# Patient Record
Sex: Female | Born: 1935 | Race: White | Hispanic: No | Smoking: Never smoker
Health system: Southern US, Community
[De-identification: ages and names within clinical notes are randomized; demographics above are authoritative.]

## PROBLEM LIST (undated history)

## (undated) DIAGNOSIS — I4891 Unspecified atrial fibrillation: Secondary | ICD-10-CM

## (undated) DIAGNOSIS — Z950 Presence of cardiac pacemaker: Secondary | ICD-10-CM

## (undated) DIAGNOSIS — M1990 Unspecified osteoarthritis, unspecified site: Secondary | ICD-10-CM

## (undated) DIAGNOSIS — M11279 Other chondrocalcinosis, unspecified ankle and foot: Secondary | ICD-10-CM

## (undated) DIAGNOSIS — G459 Transient cerebral ischemic attack, unspecified: Secondary | ICD-10-CM

## (undated) DIAGNOSIS — K219 Gastro-esophageal reflux disease without esophagitis: Secondary | ICD-10-CM

## (undated) DIAGNOSIS — J45909 Unspecified asthma, uncomplicated: Secondary | ICD-10-CM

## (undated) DIAGNOSIS — R011 Cardiac murmur, unspecified: Secondary | ICD-10-CM

## (undated) DIAGNOSIS — H269 Unspecified cataract: Secondary | ICD-10-CM

## (undated) DIAGNOSIS — L309 Dermatitis, unspecified: Secondary | ICD-10-CM

## (undated) DIAGNOSIS — M109 Gout, unspecified: Secondary | ICD-10-CM

## (undated) DIAGNOSIS — E079 Disorder of thyroid, unspecified: Secondary | ICD-10-CM

## (undated) HISTORY — PX: PACEMAKER INSERTION: SHX728

## (undated) HISTORY — DX: Gout, unspecified: M10.9

## (undated) HISTORY — PX: EYE SURGERY: SHX253

## (undated) HISTORY — DX: Dermatitis, unspecified: L30.9

## (undated) HISTORY — PX: OTHER SURGICAL HISTORY: SHX169

## (undated) HISTORY — PX: HIP ARTHROPLASTY: SHX981

## (undated) HISTORY — DX: Disorder of thyroid, unspecified: E07.9

## (undated) HISTORY — DX: Unspecified osteoarthritis, unspecified site: M19.90

## (undated) HISTORY — DX: Cardiac murmur, unspecified: R01.1

## (undated) HISTORY — DX: Unspecified atrial fibrillation: I48.91

## (undated) HISTORY — PX: ABLATION: SHX5711

## (undated) HISTORY — DX: Unspecified cataract: H26.9

## (undated) HISTORY — DX: Gastro-esophageal reflux disease without esophagitis: K21.9

## (undated) HISTORY — DX: Other chondrocalcinosis, unspecified ankle and foot: M11.279

---

## 2009-10-20 DIAGNOSIS — Z9889 Other specified postprocedural states: Secondary | ICD-10-CM | POA: Insufficient documentation

## 2011-04-15 ENCOUNTER — Ambulatory Visit: Payer: Self-pay

## 2011-12-23 ENCOUNTER — Ambulatory Visit: Payer: Self-pay | Admitting: Internal Medicine

## 2011-12-28 ENCOUNTER — Ambulatory Visit: Payer: Self-pay | Admitting: Family Medicine

## 2011-12-29 ENCOUNTER — Emergency Department: Payer: Self-pay | Admitting: *Deleted

## 2011-12-29 LAB — CBC
HCT: 42.5 % (ref 35.0–47.0)
HGB: 14 g/dL (ref 12.0–16.0)
MCH: 29.6 pg (ref 26.0–34.0)
MCHC: 32.9 g/dL (ref 32.0–36.0)
MCV: 90 fL (ref 80–100)
Platelet: 284 10*3/uL (ref 150–440)
RBC: 4.72 10*6/uL (ref 3.80–5.20)
RDW: 13.4 % (ref 11.5–14.5)
WBC: 9.2 10*3/uL (ref 3.6–11.0)

## 2011-12-30 LAB — COMPREHENSIVE METABOLIC PANEL
Albumin: 4 g/dL (ref 3.4–5.0)
Alkaline Phosphatase: 143 U/L — ABNORMAL HIGH (ref 50–136)
Anion Gap: 6 — ABNORMAL LOW (ref 7–16)
BUN: 10 mg/dL (ref 7–18)
Bilirubin,Total: 0.4 mg/dL (ref 0.2–1.0)
Calcium, Total: 8.7 mg/dL (ref 8.5–10.1)
Chloride: 100 mmol/L (ref 98–107)
Co2: 30 mmol/L (ref 21–32)
Creatinine: 0.69 mg/dL (ref 0.60–1.30)
EGFR (African American): >60
EGFR (Non-African Amer.): >60
Glucose: 111 mg/dL — ABNORMAL HIGH (ref 65–99)
Osmolality: 272 (ref 275–301)
Potassium: 3.8 mmol/L (ref 3.5–5.1)
SGOT(AST): 24 U/L (ref 15–37)
SGPT (ALT): 21 U/L (ref 12–78)
Sodium: 136 mmol/L (ref 136–145)
Total Protein: 7.5 g/dL (ref 6.4–8.2)

## 2011-12-30 LAB — CK TOTAL AND CKMB (NOT AT ARMC)
CK, Total: 78 U/L (ref 21–215)
CK-MB: 1.1 ng/mL (ref 0.5–3.6)

## 2011-12-30 LAB — MAGNESIUM: Magnesium: 1.9 mg/dL

## 2011-12-30 LAB — TROPONIN I: Troponin-I: <0.02 ng/mL

## 2012-02-23 ENCOUNTER — Ambulatory Visit: Payer: Self-pay

## 2012-02-29 ENCOUNTER — Emergency Department: Payer: Self-pay | Admitting: Emergency Medicine

## 2012-02-29 ENCOUNTER — Ambulatory Visit: Payer: Self-pay | Admitting: Family Medicine

## 2012-02-29 LAB — CBC
HCT: 40.5 % (ref 35.0–47.0)
HGB: 13.7 g/dL (ref 12.0–16.0)
MCH: 30.2 pg (ref 26.0–34.0)
MCHC: 33.8 g/dL (ref 32.0–36.0)
MCV: 90 fL (ref 80–100)
Platelet: 306 10*3/uL (ref 150–440)
RBC: 4.53 10*6/uL (ref 3.80–5.20)
RDW: 14.1 % (ref 11.5–14.5)
WBC: 7.3 10*3/uL (ref 3.6–11.0)

## 2012-02-29 LAB — COMPREHENSIVE METABOLIC PANEL
Albumin: 3.6 g/dL (ref 3.4–5.0)
Alkaline Phosphatase: 155 U/L — ABNORMAL HIGH (ref 50–136)
Anion Gap: 9 (ref 7–16)
BUN: 6 mg/dL — ABNORMAL LOW (ref 7–18)
Bilirubin,Total: 0.6 mg/dL (ref 0.2–1.0)
Calcium, Total: 8.9 mg/dL (ref 8.5–10.1)
Chloride: 94 mmol/L — ABNORMAL LOW (ref 98–107)
Co2: 28 mmol/L (ref 21–32)
Creatinine: 0.65 mg/dL (ref 0.60–1.30)
EGFR (African American): >60
EGFR (Non-African Amer.): >60
Glucose: 96 mg/dL (ref 65–99)
Osmolality: 260 (ref 275–301)
Potassium: 3.6 mmol/L (ref 3.5–5.1)
SGOT(AST): 17 U/L (ref 15–37)
SGPT (ALT): 20 U/L (ref 12–78)
Sodium: 131 mmol/L — ABNORMAL LOW (ref 136–145)
Total Protein: 7.1 g/dL (ref 6.4–8.2)

## 2012-03-06 ENCOUNTER — Ambulatory Visit: Payer: Self-pay | Admitting: Vascular Surgery

## 2012-04-21 ENCOUNTER — Ambulatory Visit: Payer: Self-pay | Admitting: Family Medicine

## 2012-04-21 LAB — URINALYSIS, COMPLETE
Glucose,UR: NEGATIVE mg/dL (ref 0–75)
Ketone: NEGATIVE
Leukocyte Esterase: NEGATIVE
Nitrite: NEGATIVE
Ph: 7 (ref 4.5–8.0)
Specific Gravity: 1.01 (ref 1.003–1.030)

## 2012-04-22 LAB — URINE CULTURE

## 2012-05-20 ENCOUNTER — Emergency Department: Payer: Self-pay | Admitting: Emergency Medicine

## 2012-05-20 LAB — URINALYSIS, COMPLETE
Bilirubin,UR: NEGATIVE
Blood: NEGATIVE
Glucose,UR: NEGATIVE mg/dL (ref 0–75)
Ketone: NEGATIVE
Leukocyte Esterase: NEGATIVE
Nitrite: NEGATIVE
Ph: 6 (ref 4.5–8.0)
Protein: NEGATIVE
RBC,UR: 2 /HPF (ref 0–5)
Specific Gravity: >1.06 (ref 1.003–1.030)
Squamous Epithelial: <1
WBC UR: <1 /HPF (ref 0–5)

## 2012-05-20 LAB — COMPREHENSIVE METABOLIC PANEL
Albumin: 3.7 g/dL (ref 3.4–5.0)
Alkaline Phosphatase: 154 U/L — ABNORMAL HIGH (ref 50–136)
Anion Gap: 10 (ref 7–16)
BUN: 13 mg/dL (ref 7–18)
Bilirubin,Total: 0.6 mg/dL (ref 0.2–1.0)
Calcium, Total: 8.5 mg/dL (ref 8.5–10.1)
Chloride: 103 mmol/L (ref 98–107)
Co2: 24 mmol/L (ref 21–32)
Creatinine: 0.73 mg/dL (ref 0.60–1.30)
EGFR (African American): >60
EGFR (Non-African Amer.): >60
Glucose: 106 mg/dL — ABNORMAL HIGH (ref 65–99)
Osmolality: 274 (ref 275–301)
Potassium: 3.6 mmol/L (ref 3.5–5.1)
SGOT(AST): 24 U/L (ref 15–37)
SGPT (ALT): 26 U/L (ref 12–78)
Sodium: 137 mmol/L (ref 136–145)
Total Protein: 7.3 g/dL (ref 6.4–8.2)

## 2012-05-20 LAB — CBC
HCT: 42.1 % (ref 35.0–47.0)
HGB: 14 g/dL (ref 12.0–16.0)
MCH: 30.2 pg (ref 26.0–34.0)
MCHC: 33.2 g/dL (ref 32.0–36.0)
MCV: 91 fL (ref 80–100)
Platelet: 334 10*3/uL (ref 150–440)
RBC: 4.63 10*6/uL (ref 3.80–5.20)
RDW: 13.8 % (ref 11.5–14.5)
WBC: 14.3 10*3/uL — ABNORMAL HIGH (ref 3.6–11.0)

## 2012-05-20 LAB — CK TOTAL AND CKMB (NOT AT ARMC)
CK, Total: 86 U/L (ref 21–215)
CK-MB: 1.9 ng/mL (ref 0.5–3.6)

## 2012-05-20 LAB — TROPONIN I: Troponin-I: <0.02 ng/mL

## 2012-05-20 LAB — LIPASE, BLOOD: Lipase: 95 U/L (ref 73–393)

## 2012-05-20 LAB — TSH: Thyroid Stimulating Horm: 1.41 u[IU]/mL

## 2012-06-01 DIAGNOSIS — M87059 Idiopathic aseptic necrosis of unspecified femur: Secondary | ICD-10-CM | POA: Insufficient documentation

## 2012-06-01 DIAGNOSIS — M5136 Other intervertebral disc degeneration, lumbar region: Secondary | ICD-10-CM | POA: Insufficient documentation

## 2012-06-01 DIAGNOSIS — M5416 Radiculopathy, lumbar region: Secondary | ICD-10-CM | POA: Insufficient documentation

## 2012-06-03 ENCOUNTER — Inpatient Hospital Stay: Payer: Self-pay | Admitting: Internal Medicine

## 2012-06-03 LAB — COMPREHENSIVE METABOLIC PANEL
Albumin: 3.7 g/dL (ref 3.4–5.0)
Alkaline Phosphatase: 100 U/L (ref 50–136)
Anion Gap: 8 (ref 7–16)
BUN: 16 mg/dL (ref 7–18)
Bilirubin,Total: 0.3 mg/dL (ref 0.2–1.0)
Calcium, Total: 8.6 mg/dL (ref 8.5–10.1)
Chloride: 103 mmol/L (ref 98–107)
Co2: 27 mmol/L (ref 21–32)
Creatinine: 0.93 mg/dL (ref 0.60–1.30)
EGFR (African American): >60
EGFR (Non-African Amer.): 56 — ABNORMAL LOW
Glucose: 93 mg/dL (ref 65–99)
Osmolality: 277 (ref 275–301)
Potassium: 3.9 mmol/L (ref 3.5–5.1)
SGOT(AST): 27 U/L (ref 15–37)
SGPT (ALT): 23 U/L (ref 12–78)
Sodium: 138 mmol/L (ref 136–145)
Total Protein: 6.9 g/dL (ref 6.4–8.2)

## 2012-06-03 LAB — CBC
HCT: 40.8 % (ref 35.0–47.0)
HGB: 13.7 g/dL (ref 12.0–16.0)
MCH: 30.3 pg (ref 26.0–34.0)
MCHC: 33.6 g/dL (ref 32.0–36.0)
MCV: 90 fL (ref 80–100)
Platelet: 396 10*3/uL (ref 150–440)
RBC: 4.53 10*6/uL (ref 3.80–5.20)
RDW: 13.9 % (ref 11.5–14.5)
WBC: 12.9 10*3/uL — ABNORMAL HIGH (ref 3.6–11.0)

## 2012-06-03 LAB — URINALYSIS, COMPLETE
Bacteria: NONE SEEN
Bilirubin,UR: NEGATIVE
Blood: NEGATIVE
Glucose,UR: NEGATIVE mg/dL (ref 0–75)
Ketone: NEGATIVE
Leukocyte Esterase: NEGATIVE
Nitrite: NEGATIVE
Ph: 7 (ref 4.5–8.0)
Protein: NEGATIVE
RBC,UR: <1 /HPF (ref 0–5)
Specific Gravity: 1.01 (ref 1.003–1.030)
Squamous Epithelial: <1
WBC UR: <1 /HPF (ref 0–5)

## 2012-06-03 LAB — TROPONIN I
Troponin-I: <0.02 ng/mL
Troponin-I: <0.02 ng/mL

## 2012-06-03 LAB — CK TOTAL AND CKMB (NOT AT ARMC)
CK, Total: 67 U/L (ref 21–215)
CK, Total: 75 U/L (ref 21–215)
CK-MB: 0.6 ng/mL (ref 0.5–3.6)
CK-MB: 0.7 ng/mL (ref 0.5–3.6)

## 2012-06-03 LAB — PRO B NATRIURETIC PEPTIDE: B-Type Natriuretic Peptide: 1106 pg/mL — ABNORMAL HIGH (ref 0–450)

## 2012-06-03 LAB — CK-MB: CK-MB: 0.7 ng/mL (ref 0.5–3.6)

## 2012-06-04 LAB — CBC WITH DIFFERENTIAL/PLATELET
Basophil #: 0.1 10*3/uL (ref 0.0–0.1)
Basophil %: 0.9 %
Eosinophil #: 0.2 10*3/uL (ref 0.0–0.7)
Eosinophil %: 1.7 %
HCT: 37.9 % (ref 35.0–47.0)
HGB: 12.6 g/dL (ref 12.0–16.0)
Lymphocyte #: 1.5 10*3/uL (ref 1.0–3.6)
Lymphocyte %: 15.1 %
MCH: 30 pg (ref 26.0–34.0)
MCHC: 33.3 g/dL (ref 32.0–36.0)
MCV: 90 fL (ref 80–100)
Monocyte #: 1.3 x10 3/mm — ABNORMAL HIGH (ref 0.2–0.9)
Monocyte %: 13.6 %
Neutrophil #: 6.7 10*3/uL — ABNORMAL HIGH (ref 1.4–6.5)
Neutrophil %: 68.7 %
Platelet: 355 10*3/uL (ref 150–440)
RBC: 4.21 10*6/uL (ref 3.80–5.20)
RDW: 14.1 % (ref 11.5–14.5)
WBC: 9.8 10*3/uL (ref 3.6–11.0)

## 2012-06-04 LAB — COMPREHENSIVE METABOLIC PANEL
Albumin: 2.9 g/dL — ABNORMAL LOW (ref 3.4–5.0)
Alkaline Phosphatase: 92 U/L (ref 50–136)
Anion Gap: 8 (ref 7–16)
BUN: 11 mg/dL (ref 7–18)
Bilirubin,Total: 0.2 mg/dL (ref 0.2–1.0)
Calcium, Total: 8 mg/dL — ABNORMAL LOW (ref 8.5–10.1)
Chloride: 110 mmol/L — ABNORMAL HIGH (ref 98–107)
Co2: 24 mmol/L (ref 21–32)
Creatinine: 0.71 mg/dL (ref 0.60–1.30)
EGFR (African American): >60
EGFR (Non-African Amer.): >60
Glucose: 116 mg/dL — ABNORMAL HIGH (ref 65–99)
Osmolality: 283 (ref 275–301)
Potassium: 3.5 mmol/L (ref 3.5–5.1)
SGOT(AST): 19 U/L (ref 15–37)
SGPT (ALT): 20 U/L (ref 12–78)
Sodium: 142 mmol/L (ref 136–145)
Total Protein: 5.8 g/dL — ABNORMAL LOW (ref 6.4–8.2)

## 2012-06-04 LAB — LIPID PANEL
Cholesterol: 112 mg/dL (ref 0–200)
HDL Cholesterol: 58 mg/dL (ref 40–60)
Ldl Cholesterol, Calc: 33 mg/dL (ref 0–100)
Triglycerides: 104 mg/dL (ref 0–200)
VLDL Cholesterol, Calc: 21 mg/dL (ref 5–40)

## 2012-06-04 LAB — CK TOTAL AND CKMB (NOT AT ARMC)
CK, Total: 42 U/L (ref 21–215)
CK-MB: 0.6 ng/mL (ref 0.5–3.6)

## 2012-06-04 LAB — TROPONIN I: Troponin-I: <0.02 ng/mL

## 2012-06-04 LAB — TSH: Thyroid Stimulating Horm: 1.96 u[IU]/mL

## 2012-06-05 DIAGNOSIS — Z9889 Other specified postprocedural states: Secondary | ICD-10-CM | POA: Insufficient documentation

## 2012-06-14 ENCOUNTER — Observation Stay: Payer: Self-pay | Admitting: Internal Medicine

## 2012-06-14 DIAGNOSIS — Z9889 Other specified postprocedural states: Secondary | ICD-10-CM | POA: Insufficient documentation

## 2012-06-14 LAB — URINALYSIS, COMPLETE
Bacteria: NONE SEEN
Bilirubin,UR: NEGATIVE
Blood: NEGATIVE
Glucose,UR: NEGATIVE mg/dL (ref 0–75)
Ketone: NEGATIVE
Leukocyte Esterase: NEGATIVE
Nitrite: NEGATIVE
Ph: 7 (ref 4.5–8.0)
Protein: NEGATIVE
RBC,UR: 1 /HPF (ref 0–5)
Specific Gravity: 1.009 (ref 1.003–1.030)
Squamous Epithelial: NONE SEEN
WBC UR: <1 /HPF (ref 0–5)

## 2012-06-14 LAB — TROPONIN I
Troponin-I: <0.02 ng/mL
Troponin-I: <0.02 ng/mL

## 2012-06-14 LAB — PROTIME-INR
INR: 1.4
Prothrombin Time: 17.2 secs — ABNORMAL HIGH (ref 11.5–14.7)

## 2012-06-14 LAB — APTT: Activated PTT: 57.5 secs — ABNORMAL HIGH (ref 23.6–35.9)

## 2012-06-14 LAB — CBC
HCT: 39.6 % (ref 35.0–47.0)
HGB: 12.8 g/dL (ref 12.0–16.0)
MCH: 29.5 pg (ref 26.0–34.0)
MCHC: 32.4 g/dL (ref 32.0–36.0)
MCV: 91 fL (ref 80–100)
Platelet: 363 10*3/uL (ref 150–440)
RBC: 4.34 10*6/uL (ref 3.80–5.20)
RDW: 14.2 % (ref 11.5–14.5)
WBC: 13.2 10*3/uL — ABNORMAL HIGH (ref 3.6–11.0)

## 2012-06-14 LAB — CK TOTAL AND CKMB (NOT AT ARMC)
CK, Total: 48 U/L (ref 21–215)
CK-MB: <0.5 ng/mL — ABNORMAL LOW (ref 0.5–3.6)

## 2012-06-14 LAB — TSH: Thyroid Stimulating Horm: 3.85 u[IU]/mL

## 2012-06-15 LAB — CBC WITH DIFFERENTIAL/PLATELET
Basophil #: 0 10*3/uL (ref 0.0–0.1)
Basophil %: 0.5 %
Eosinophil #: 0.2 10*3/uL (ref 0.0–0.7)
Eosinophil %: 1.9 %
HCT: 39.7 % (ref 35.0–47.0)
HGB: 13.2 g/dL (ref 12.0–16.0)
Lymphocyte #: 1.7 10*3/uL (ref 1.0–3.6)
Lymphocyte %: 18.1 %
MCH: 30 pg (ref 26.0–34.0)
MCHC: 33.2 g/dL (ref 32.0–36.0)
MCV: 90 fL (ref 80–100)
Monocyte #: 1 x10 3/mm — ABNORMAL HIGH (ref 0.2–0.9)
Monocyte %: 10.8 %
Neutrophil #: 6.4 10*3/uL (ref 1.4–6.5)
Neutrophil %: 68.7 %
Platelet: 366 10*3/uL (ref 150–440)
RBC: 4.4 10*6/uL (ref 3.80–5.20)
RDW: 14.3 % (ref 11.5–14.5)
WBC: 9.3 10*3/uL (ref 3.6–11.0)

## 2012-06-15 LAB — BASIC METABOLIC PANEL
Anion Gap: 9 (ref 7–16)
BUN: 12 mg/dL (ref 7–18)
Calcium, Total: 9 mg/dL (ref 8.5–10.1)
Chloride: 102 mmol/L (ref 98–107)
Co2: 27 mmol/L (ref 21–32)
Creatinine: 0.78 mg/dL (ref 0.60–1.30)
EGFR (African American): >60
EGFR (Non-African Amer.): >60
Glucose: 103 mg/dL — ABNORMAL HIGH (ref 65–99)
Osmolality: 276 (ref 275–301)
Potassium: 3.4 mmol/L — ABNORMAL LOW (ref 3.5–5.1)
Sodium: 138 mmol/L (ref 136–145)

## 2012-06-15 LAB — TROPONIN I: Troponin-I: <0.02 ng/mL

## 2012-10-10 DIAGNOSIS — I440 Atrioventricular block, first degree: Secondary | ICD-10-CM | POA: Insufficient documentation

## 2013-01-02 ENCOUNTER — Encounter: Payer: Self-pay | Admitting: Physical Medicine and Rehabilitation

## 2013-01-17 ENCOUNTER — Ambulatory Visit: Payer: Self-pay

## 2013-01-17 LAB — RAPID INFLUENZA A&B ANTIGENS

## 2013-02-24 LAB — URINALYSIS, COMPLETE
Bacteria: NONE SEEN
Bilirubin,UR: NEGATIVE
Glucose,UR: NEGATIVE mg/dL (ref 0–75)
Leukocyte Esterase: NEGATIVE
Nitrite: NEGATIVE
Ph: 6 (ref 4.5–8.0)
Protein: NEGATIVE
RBC,UR: 4 /HPF (ref 0–5)
Specific Gravity: 1.013 (ref 1.003–1.030)
Squamous Epithelial: NONE SEEN
WBC UR: 1 /HPF (ref 0–5)

## 2013-02-24 LAB — CBC WITH DIFFERENTIAL/PLATELET
Basophil: 1 %
Eosinophil: 1 %
HCT: 38.9 % (ref 35.0–47.0)
HGB: 13 g/dL (ref 12.0–16.0)
Lymphocytes: 7 %
MCH: 28.3 pg (ref 26.0–34.0)
MCHC: 33.5 g/dL (ref 32.0–36.0)
MCV: 85 fL (ref 80–100)
Monocytes: 13 %
Platelet: 297 10*3/uL (ref 150–440)
RBC: 4.61 10*6/uL (ref 3.80–5.20)
RDW: 15.9 % — ABNORMAL HIGH (ref 11.5–14.5)
Segmented Neutrophils: 78 %
WBC: 17.4 10*3/uL — ABNORMAL HIGH (ref 3.6–11.0)

## 2013-02-24 LAB — COMPREHENSIVE METABOLIC PANEL
Albumin: 3.3 g/dL — ABNORMAL LOW (ref 3.4–5.0)
Alkaline Phosphatase: 146 U/L — ABNORMAL HIGH (ref 50–136)
Anion Gap: 7 (ref 7–16)
BUN: 12 mg/dL (ref 7–18)
Bilirubin,Total: 1.2 mg/dL — ABNORMAL HIGH (ref 0.2–1.0)
Calcium, Total: 8.9 mg/dL (ref 8.5–10.1)
Chloride: 96 mmol/L — ABNORMAL LOW (ref 98–107)
Co2: 28 mmol/L (ref 21–32)
Creatinine: 0.78 mg/dL (ref 0.60–1.30)
EGFR (African American): >60
EGFR (Non-African Amer.): >60
Glucose: 94 mg/dL (ref 65–99)
Osmolality: 262 (ref 275–301)
Potassium: 3.7 mmol/L (ref 3.5–5.1)
SGOT(AST): 22 U/L (ref 15–37)
SGPT (ALT): 15 U/L (ref 12–78)
Sodium: 131 mmol/L — ABNORMAL LOW (ref 136–145)
Total Protein: 7.1 g/dL (ref 6.4–8.2)

## 2013-02-24 LAB — CK TOTAL AND CKMB (NOT AT ARMC)
CK, Total: 96 U/L (ref 21–215)
CK-MB: 2.1 ng/mL (ref 0.5–3.6)

## 2013-02-24 LAB — SEDIMENTATION RATE: Erythrocyte Sed Rate: 28 mm/hr (ref 0–30)

## 2013-02-24 LAB — TROPONIN I: Troponin-I: <0.02 ng/mL

## 2013-02-25 ENCOUNTER — Inpatient Hospital Stay: Payer: Self-pay | Admitting: Internal Medicine

## 2013-02-25 LAB — CBC WITH DIFFERENTIAL/PLATELET
Basophil #: 0.1 10*3/uL (ref 0.0–0.1)
Basophil %: 0.8 %
Eosinophil #: 0 10*3/uL (ref 0.0–0.7)
Eosinophil %: 0.2 %
HCT: 34.2 % — ABNORMAL LOW (ref 35.0–47.0)
HGB: 11.4 g/dL — ABNORMAL LOW (ref 12.0–16.0)
Lymphocyte #: 0.7 10*3/uL — ABNORMAL LOW (ref 1.0–3.6)
Lymphocyte %: 4.4 %
MCH: 28.2 pg (ref 26.0–34.0)
MCHC: 33.3 g/dL (ref 32.0–36.0)
MCV: 85 fL (ref 80–100)
Monocyte #: 2.4 x10 3/mm — ABNORMAL HIGH (ref 0.2–0.9)
Monocyte %: 15.1 %
Neutrophil #: 12.7 10*3/uL — ABNORMAL HIGH (ref 1.4–6.5)
Neutrophil %: 79.5 %
Platelet: 278 10*3/uL (ref 150–440)
RBC: 4.04 10*6/uL (ref 3.80–5.20)
RDW: 15.5 % — ABNORMAL HIGH (ref 11.5–14.5)
WBC: 16 10*3/uL — ABNORMAL HIGH (ref 3.6–11.0)

## 2013-02-25 LAB — SYNOVIAL FLUID, CRYSTAL: Crystals, Joint Fluid: NONE SEEN

## 2013-02-25 LAB — BASIC METABOLIC PANEL
Anion Gap: 3 — ABNORMAL LOW (ref 7–16)
BUN: 11 mg/dL (ref 7–18)
Calcium, Total: 8.5 mg/dL (ref 8.5–10.1)
Chloride: 95 mmol/L — ABNORMAL LOW (ref 98–107)
Co2: 32 mmol/L (ref 21–32)
Creatinine: 0.88 mg/dL (ref 0.60–1.30)
EGFR (African American): >60
EGFR (Non-African Amer.): 60 — ABNORMAL LOW
Glucose: 113 mg/dL — ABNORMAL HIGH (ref 65–99)
Osmolality: 261 (ref 275–301)
Potassium: 3.2 mmol/L — ABNORMAL LOW (ref 3.5–5.1)
Sodium: 130 mmol/L — ABNORMAL LOW (ref 136–145)

## 2013-02-25 LAB — TSH: Thyroid Stimulating Horm: 0.893 u[IU]/mL

## 2013-02-25 LAB — MAGNESIUM: Magnesium: 1.7 mg/dL — ABNORMAL LOW

## 2013-02-25 LAB — SEDIMENTATION RATE: Erythrocyte Sed Rate: 66 mm/hr — ABNORMAL HIGH (ref 0–30)

## 2013-02-26 LAB — CBC WITH DIFFERENTIAL/PLATELET
Basophil #: 0 x10 3/mm 3
Basophil %: 0.4 %
Eosinophil #: 0.1 x10 3/mm 3
Eosinophil %: 0.4 %
HCT: 33.3 % — ABNORMAL LOW
HGB: 11.2 g/dL — ABNORMAL LOW
Lymphocyte %: 4.2 %
Lymphs Abs: 0.5 x10 3/mm 3 — ABNORMAL LOW
MCH: 28.6 pg
MCHC: 33.6 g/dL
MCV: 85 fL
Monocyte #: 1.9 "x10 3/mm " — ABNORMAL HIGH
Monocyte %: 14.9 %
Neutrophil #: 10.2 x10 3/mm 3 — ABNORMAL HIGH
Neutrophil %: 80.1 %
Platelet: 240 x10 3/mm 3
RBC: 3.92 X10 6/mm 3
RDW: 15.7 % — ABNORMAL HIGH
WBC: 12.7 x10 3/mm 3 — ABNORMAL HIGH

## 2013-02-26 LAB — MAGNESIUM: Magnesium: 1.8 mg/dL

## 2013-02-27 LAB — CBC WITH DIFFERENTIAL/PLATELET
Basophil #: 0 10*3/uL (ref 0.0–0.1)
Basophil %: 0.5 %
Eosinophil #: 0.1 10*3/uL (ref 0.0–0.7)
Eosinophil %: 1.6 %
HCT: 29.3 % — ABNORMAL LOW (ref 35.0–47.0)
HGB: 10.1 g/dL — ABNORMAL LOW (ref 12.0–16.0)
Lymphocyte #: 0.5 10*3/uL — ABNORMAL LOW (ref 1.0–3.6)
Lymphocyte %: 6.5 %
MCH: 28.9 pg (ref 26.0–34.0)
MCHC: 34.5 g/dL (ref 32.0–36.0)
MCV: 84 fL (ref 80–100)
Monocyte #: 1.1 x10 3/mm — ABNORMAL HIGH (ref 0.2–0.9)
Monocyte %: 13.4 %
Neutrophil #: 6.5 10*3/uL (ref 1.4–6.5)
Neutrophil %: 78 %
Platelet: 235 10*3/uL (ref 150–440)
RBC: 3.5 10*6/uL — ABNORMAL LOW (ref 3.80–5.20)
RDW: 15.5 % — ABNORMAL HIGH (ref 11.5–14.5)
WBC: 8.3 10*3/uL (ref 3.6–11.0)

## 2013-02-27 LAB — BASIC METABOLIC PANEL
Anion Gap: 6 — ABNORMAL LOW (ref 7–16)
BUN: 7 mg/dL (ref 7–18)
Calcium, Total: 8.4 mg/dL — ABNORMAL LOW (ref 8.5–10.1)
Chloride: 101 mmol/L (ref 98–107)
Co2: 26 mmol/L (ref 21–32)
Creatinine: 0.67 mg/dL (ref 0.60–1.30)
EGFR (African American): >60
EGFR (Non-African Amer.): >60
Glucose: 105 mg/dL — ABNORMAL HIGH (ref 65–99)
Osmolality: 265 (ref 275–301)
Potassium: 3.3 mmol/L — ABNORMAL LOW (ref 3.5–5.1)
Sodium: 133 mmol/L — ABNORMAL LOW (ref 136–145)

## 2013-02-27 LAB — SEDIMENTATION RATE: Erythrocyte Sed Rate: 68 mm/hr — ABNORMAL HIGH (ref 0–30)

## 2013-03-01 LAB — CULTURE, BLOOD (SINGLE)

## 2013-03-01 LAB — BODY FLUID CULTURE

## 2013-03-02 LAB — CULTURE, BLOOD (SINGLE)

## 2013-03-08 ENCOUNTER — Ambulatory Visit: Payer: Self-pay | Admitting: Internal Medicine

## 2013-03-14 ENCOUNTER — Ambulatory Visit: Payer: Self-pay | Admitting: Internal Medicine

## 2013-03-14 LAB — SEDIMENTATION RATE: Erythrocyte Sed Rate: 5 mm/hr (ref 0–30)

## 2013-03-14 LAB — CBC CANCER CENTER
Basophil #: 0.1 x10 3/mm (ref 0.0–0.1)
Basophil %: 0.7 %
Comment - H1-Com3: NORMAL
Eosinophil #: 0 x10 3/mm (ref 0.0–0.7)
Eosinophil %: 0.1 %
HCT: 41.8 % (ref 35.0–47.0)
HGB: 13.1 g/dL (ref 12.0–16.0)
Lymphocyte #: 1.1 x10 3/mm (ref 1.0–3.6)
Lymphocyte %: 8.2 %
Lymphocytes: 11 %
MCH: 26.9 pg (ref 26.0–34.0)
MCHC: 31.3 g/dL — ABNORMAL LOW (ref 32.0–36.0)
MCV: 86 fL (ref 80–100)
Monocyte #: 0.9 x10 3/mm (ref 0.2–0.9)
Monocyte %: 6.3 %
Monocytes: 7 %
Neutrophil #: 11.7 x10 3/mm — ABNORMAL HIGH (ref 1.4–6.5)
Neutrophil %: 84.7 %
Platelet: 377 x10 3/mm (ref 150–440)
RBC: 4.86 10*6/uL (ref 3.80–5.20)
RDW: 16.3 % — ABNORMAL HIGH (ref 11.5–14.5)
Segmented Neutrophils: 81 %
Variant Lymphocyte: 1 %
WBC: 13.8 x10 3/mm — ABNORMAL HIGH (ref 3.6–11.0)

## 2013-03-14 LAB — LACTATE DEHYDROGENASE: LDH: 203 U/L (ref 81–246)

## 2013-03-19 LAB — PROT IMMUNOELECTROPHORES(ARMC)

## 2013-03-25 ENCOUNTER — Ambulatory Visit: Payer: Self-pay | Admitting: Internal Medicine

## 2013-04-21 ENCOUNTER — Ambulatory Visit: Payer: Self-pay | Admitting: Family Medicine

## 2013-04-25 ENCOUNTER — Ambulatory Visit: Payer: Self-pay | Admitting: Internal Medicine

## 2013-04-29 ENCOUNTER — Inpatient Hospital Stay: Payer: Self-pay | Admitting: Internal Medicine

## 2013-04-29 LAB — PROTIME-INR
INR: 1.5
Prothrombin Time: 18 secs — ABNORMAL HIGH (ref 11.5–14.7)

## 2013-04-29 LAB — URINALYSIS, COMPLETE
Bacteria: NONE SEEN
Bilirubin,UR: NEGATIVE
Glucose,UR: NEGATIVE mg/dL (ref 0–75)
Ketone: NEGATIVE
Leukocyte Esterase: NEGATIVE
Nitrite: NEGATIVE
Ph: 6 (ref 4.5–8.0)
Protein: NEGATIVE
RBC,UR: 2 /HPF (ref 0–5)
Specific Gravity: 1.015 (ref 1.003–1.030)
Squamous Epithelial: 1
WBC UR: <1 /HPF (ref 0–5)

## 2013-04-29 LAB — CBC WITH DIFFERENTIAL/PLATELET
Basophil #: 0.1 10*3/uL (ref 0.0–0.1)
Basophil %: 0.5 %
Eosinophil #: 0 10*3/uL (ref 0.0–0.7)
Eosinophil %: 0.1 %
HCT: 42.1 % (ref 35.0–47.0)
HGB: 13.4 g/dL (ref 12.0–16.0)
Lymphocyte #: 1.4 10*3/uL (ref 1.0–3.6)
Lymphocyte %: 6.2 %
MCH: 26.6 pg (ref 26.0–34.0)
MCHC: 31.8 g/dL — ABNORMAL LOW (ref 32.0–36.0)
MCV: 84 fL (ref 80–100)
Monocyte #: 2.7 x10 3/mm — ABNORMAL HIGH (ref 0.2–0.9)
Monocyte %: 12.2 %
Neutrophil #: 18.2 10*3/uL — ABNORMAL HIGH (ref 1.4–6.5)
Neutrophil %: 81 %
Platelet: 380 10*3/uL (ref 150–440)
RBC: 5.03 10*6/uL (ref 3.80–5.20)
RDW: 16.5 % — ABNORMAL HIGH (ref 11.5–14.5)
WBC: 22.5 10*3/uL — ABNORMAL HIGH (ref 3.6–11.0)

## 2013-04-29 LAB — APTT: Activated PTT: 61.8 secs — ABNORMAL HIGH (ref 23.6–35.9)

## 2013-04-29 LAB — COMPREHENSIVE METABOLIC PANEL
Albumin: 3.7 g/dL (ref 3.4–5.0)
Alkaline Phosphatase: 141 U/L — ABNORMAL HIGH
Anion Gap: 3 — ABNORMAL LOW (ref 7–16)
BUN: 15 mg/dL (ref 7–18)
Bilirubin,Total: 1.5 mg/dL — ABNORMAL HIGH (ref 0.2–1.0)
Calcium, Total: 9 mg/dL (ref 8.5–10.1)
Chloride: 100 mmol/L (ref 98–107)
Co2: 32 mmol/L (ref 21–32)
Creatinine: 0.71 mg/dL (ref 0.60–1.30)
EGFR (African American): >60
EGFR (Non-African Amer.): >60
Glucose: 106 mg/dL — ABNORMAL HIGH (ref 65–99)
Osmolality: 271 (ref 275–301)
Potassium: 4 mmol/L (ref 3.5–5.1)
SGOT(AST): 12 U/L — ABNORMAL LOW (ref 15–37)
SGPT (ALT): 18 U/L (ref 12–78)
Sodium: 135 mmol/L — ABNORMAL LOW (ref 136–145)
Total Protein: 7.5 g/dL (ref 6.4–8.2)

## 2013-04-29 LAB — RAPID INFLUENZA A&B ANTIGENS

## 2013-04-30 LAB — CBC WITH DIFFERENTIAL/PLATELET
Basophil #: 0.1 10*3/uL (ref 0.0–0.1)
Basophil %: 0.6 %
Eosinophil #: 0 10*3/uL (ref 0.0–0.7)
Eosinophil %: 0.3 %
HCT: 36.4 % (ref 35.0–47.0)
HGB: 11.3 g/dL — ABNORMAL LOW (ref 12.0–16.0)
Lymphocyte #: 1.4 10*3/uL (ref 1.0–3.6)
Lymphocyte %: 8.4 %
MCH: 25.8 pg — ABNORMAL LOW (ref 26.0–34.0)
MCHC: 31.1 g/dL — ABNORMAL LOW (ref 32.0–36.0)
MCV: 83 fL (ref 80–100)
Monocyte #: 3.2 x10 3/mm — ABNORMAL HIGH (ref 0.2–0.9)
Monocyte %: 19.7 %
Neutrophil #: 11.7 10*3/uL — ABNORMAL HIGH (ref 1.4–6.5)
Neutrophil %: 71 %
Platelet: 289 10*3/uL (ref 150–440)
RBC: 4.38 10*6/uL (ref 3.80–5.20)
RDW: 16.4 % — ABNORMAL HIGH (ref 11.5–14.5)
WBC: 16.5 10*3/uL — ABNORMAL HIGH (ref 3.6–11.0)

## 2013-05-01 LAB — CBC WITH DIFFERENTIAL/PLATELET
Basophil #: 0 10*3/uL (ref 0.0–0.1)
Basophil %: 0.3 %
Eosinophil #: 0.1 10*3/uL (ref 0.0–0.7)
Eosinophil %: 0.7 %
HCT: 37.1 % (ref 35.0–47.0)
HGB: 11.9 g/dL — ABNORMAL LOW (ref 12.0–16.0)
Lymphocyte #: 1 10*3/uL (ref 1.0–3.6)
Lymphocyte %: 8.8 %
MCH: 26.9 pg (ref 26.0–34.0)
MCHC: 32.2 g/dL (ref 32.0–36.0)
MCV: 84 fL (ref 80–100)
Monocyte #: 1.5 x10 3/mm — ABNORMAL HIGH (ref 0.2–0.9)
Monocyte %: 14.2 %
Neutrophil #: 8.3 10*3/uL — ABNORMAL HIGH (ref 1.4–6.5)
Neutrophil %: 76 %
Platelet: 313 10*3/uL (ref 150–440)
RBC: 4.44 10*6/uL (ref 3.80–5.20)
RDW: 15.8 % — ABNORMAL HIGH (ref 11.5–14.5)
WBC: 10.9 10*3/uL (ref 3.6–11.0)

## 2013-05-04 LAB — CULTURE, BLOOD (SINGLE)

## 2013-05-30 DIAGNOSIS — M436 Torticollis: Secondary | ICD-10-CM | POA: Insufficient documentation

## 2013-06-27 ENCOUNTER — Ambulatory Visit: Payer: Self-pay | Admitting: Internal Medicine

## 2013-06-27 LAB — CBC CANCER CENTER
Basophil #: 0.1 x10 3/mm (ref 0.0–0.1)
Basophil %: 1 %
Eosinophil #: 0.2 x10 3/mm (ref 0.0–0.7)
Eosinophil %: 2 %
HCT: 37.2 % (ref 35.0–47.0)
HGB: 11.8 g/dL — ABNORMAL LOW (ref 12.0–16.0)
Lymphocyte #: 1.4 x10 3/mm (ref 1.0–3.6)
Lymphocyte %: 18.1 %
MCH: 26.4 pg (ref 26.0–34.0)
MCHC: 31.7 g/dL — ABNORMAL LOW (ref 32.0–36.0)
MCV: 83 fL (ref 80–100)
Monocyte #: 1 x10 3/mm — ABNORMAL HIGH (ref 0.2–0.9)
Monocyte %: 12.6 %
Neutrophil #: 5.2 x10 3/mm (ref 1.4–6.5)
Neutrophil %: 66.3 %
Platelet: 310 x10 3/mm (ref 150–440)
RBC: 4.46 10*6/uL (ref 3.80–5.20)
RDW: 16.2 % — ABNORMAL HIGH (ref 11.5–14.5)
WBC: 7.9 x10 3/mm (ref 3.6–11.0)

## 2013-07-24 ENCOUNTER — Ambulatory Visit: Payer: Self-pay | Admitting: Internal Medicine

## 2013-08-12 ENCOUNTER — Inpatient Hospital Stay: Payer: Self-pay | Admitting: Internal Medicine

## 2013-08-12 LAB — CBC WITH DIFFERENTIAL/PLATELET
HCT: 33.2 % — ABNORMAL LOW (ref 35.0–47.0)
HGB: 10.7 g/dL — ABNORMAL LOW (ref 12.0–16.0)
Lymphocytes: 5 %
MCH: 26.6 pg (ref 26.0–34.0)
MCHC: 32.1 g/dL (ref 32.0–36.0)
MCV: 83 fL (ref 80–100)
Monocytes: 22 %
Platelet: 326 10*3/uL (ref 150–440)
RBC: 4.01 10*6/uL (ref 3.80–5.20)
RDW: 15.9 % — ABNORMAL HIGH (ref 11.5–14.5)
Segmented Neutrophils: 73 %
WBC: 14.1 10*3/uL — ABNORMAL HIGH (ref 3.6–11.0)

## 2013-08-12 LAB — URINALYSIS, COMPLETE
Bacteria: NONE SEEN
Bilirubin,UR: NEGATIVE
Glucose,UR: NEGATIVE mg/dL (ref 0–75)
Leukocyte Esterase: NEGATIVE
Nitrite: NEGATIVE
Ph: 5 (ref 4.5–8.0)
Protein: 30
RBC,UR: NONE SEEN /HPF (ref 0–5)
Specific Gravity: 1.023 (ref 1.003–1.030)
Squamous Epithelial: NONE SEEN
WBC UR: 1 /HPF (ref 0–5)

## 2013-08-12 LAB — COMPREHENSIVE METABOLIC PANEL
Albumin: 2.7 g/dL — ABNORMAL LOW (ref 3.4–5.0)
Alkaline Phosphatase: 130 U/L — ABNORMAL HIGH
Anion Gap: 9 (ref 7–16)
BUN: 13 mg/dL (ref 7–18)
Bilirubin,Total: 0.8 mg/dL (ref 0.2–1.0)
Calcium, Total: 8.4 mg/dL — ABNORMAL LOW (ref 8.5–10.1)
Chloride: 98 mmol/L (ref 98–107)
Co2: 24 mmol/L (ref 21–32)
Creatinine: 0.84 mg/dL (ref 0.60–1.30)
EGFR (African American): >60
EGFR (Non-African Amer.): >60
Glucose: 93 mg/dL (ref 65–99)
Osmolality: 262 (ref 275–301)
Potassium: 3.7 mmol/L (ref 3.5–5.1)
SGOT(AST): 46 U/L — ABNORMAL HIGH (ref 15–37)
SGPT (ALT): 39 U/L (ref 12–78)
Sodium: 131 mmol/L — ABNORMAL LOW (ref 136–145)
Total Protein: 7.5 g/dL (ref 6.4–8.2)

## 2013-08-12 LAB — CK: CK, Total: 95 U/L

## 2013-08-13 LAB — CBC WITH DIFFERENTIAL/PLATELET
Basophil #: 0 10*3/uL (ref 0.0–0.1)
Basophil %: 0.4 %
Eosinophil #: 0 10*3/uL (ref 0.0–0.7)
Eosinophil %: 0.3 %
HCT: 31 % — ABNORMAL LOW (ref 35.0–47.0)
HGB: 10.2 g/dL — ABNORMAL LOW (ref 12.0–16.0)
Lymphocyte #: 0.9 10*3/uL — ABNORMAL LOW (ref 1.0–3.6)
Lymphocyte %: 9.4 %
MCH: 26.8 pg (ref 26.0–34.0)
MCHC: 32.8 g/dL (ref 32.0–36.0)
MCV: 82 fL (ref 80–100)
Monocyte #: 1.2 x10 3/mm — ABNORMAL HIGH (ref 0.2–0.9)
Monocyte %: 13.2 %
Neutrophil #: 7 10*3/uL — ABNORMAL HIGH (ref 1.4–6.5)
Neutrophil %: 76.7 %
Platelet: 280 10*3/uL (ref 150–440)
RBC: 3.79 10*6/uL — ABNORMAL LOW (ref 3.80–5.20)
RDW: 15.5 % — ABNORMAL HIGH (ref 11.5–14.5)
WBC: 9.1 10*3/uL (ref 3.6–11.0)

## 2013-08-13 LAB — COMPREHENSIVE METABOLIC PANEL
Albumin: 2.2 g/dL — ABNORMAL LOW (ref 3.4–5.0)
Alkaline Phosphatase: 109 U/L
Anion Gap: 9 (ref 7–16)
BUN: 10 mg/dL (ref 7–18)
Bilirubin,Total: 0.6 mg/dL (ref 0.2–1.0)
Calcium, Total: 8.3 mg/dL — ABNORMAL LOW (ref 8.5–10.1)
Chloride: 104 mmol/L (ref 98–107)
Co2: 22 mmol/L (ref 21–32)
Creatinine: 0.61 mg/dL (ref 0.60–1.30)
EGFR (African American): >60
EGFR (Non-African Amer.): >60
Glucose: 91 mg/dL (ref 65–99)
Osmolality: 269 (ref 275–301)
Potassium: 3.7 mmol/L (ref 3.5–5.1)
SGOT(AST): 57 U/L — ABNORMAL HIGH (ref 15–37)
SGPT (ALT): 39 U/L (ref 12–78)
Sodium: 135 mmol/L — ABNORMAL LOW (ref 136–145)
Total Protein: 6.3 g/dL — ABNORMAL LOW (ref 6.4–8.2)

## 2013-08-13 LAB — SYNOVIAL FLUID, CRYSTAL

## 2013-08-17 LAB — CULTURE, BLOOD (SINGLE)

## 2013-08-22 DIAGNOSIS — M1180 Other specified crystal arthropathies, unspecified site: Secondary | ICD-10-CM | POA: Insufficient documentation

## 2013-09-26 ENCOUNTER — Ambulatory Visit: Payer: Self-pay | Admitting: Internal Medicine

## 2013-09-26 LAB — CBC CANCER CENTER
Basophil #: 0 x10 3/mm (ref 0.0–0.1)
Basophil %: 0.3 %
Eosinophil #: 0 x10 3/mm (ref 0.0–0.7)
Eosinophil %: 0.2 %
HCT: 39.3 % (ref 35.0–47.0)
HGB: 11.8 g/dL — ABNORMAL LOW (ref 12.0–16.0)
Lymphocyte #: 0.9 x10 3/mm — ABNORMAL LOW (ref 1.0–3.6)
Lymphocyte %: 6 %
MCH: 24.7 pg — ABNORMAL LOW (ref 26.0–34.0)
MCHC: 30.2 g/dL — ABNORMAL LOW (ref 32.0–36.0)
MCV: 82 fL (ref 80–100)
Monocyte #: 0.7 x10 3/mm (ref 0.2–0.9)
Monocyte %: 5.2 %
Neutrophil #: 12.6 x10 3/mm — ABNORMAL HIGH (ref 1.4–6.5)
Neutrophil %: 88.3 %
Platelet: 377 x10 3/mm (ref 150–440)
RBC: 4.79 10*6/uL (ref 3.80–5.20)
RDW: 17.1 % — ABNORMAL HIGH (ref 11.5–14.5)
WBC: 14.3 x10 3/mm — ABNORMAL HIGH (ref 3.6–11.0)

## 2013-09-26 LAB — CREATININE, SERUM
Creatinine: 0.72 mg/dL (ref 0.60–1.30)
EGFR (African American): >60
EGFR (Non-African Amer.): >60

## 2013-10-09 DIAGNOSIS — M169 Osteoarthritis of hip, unspecified: Secondary | ICD-10-CM | POA: Insufficient documentation

## 2013-10-23 ENCOUNTER — Ambulatory Visit: Payer: Self-pay | Admitting: Internal Medicine

## 2013-11-07 ENCOUNTER — Emergency Department: Payer: Self-pay | Admitting: Emergency Medicine

## 2013-11-17 ENCOUNTER — Emergency Department: Payer: Self-pay | Admitting: Emergency Medicine

## 2013-11-17 LAB — CBC
HCT: 38.8 % (ref 35.0–47.0)
HGB: 12.4 g/dL (ref 12.0–16.0)
MCH: 25.7 pg — ABNORMAL LOW (ref 26.0–34.0)
MCHC: 31.9 g/dL — ABNORMAL LOW (ref 32.0–36.0)
MCV: 81 fL (ref 80–100)
Platelet: 405 10*3/uL (ref 150–440)
RBC: 4.83 10*6/uL (ref 3.80–5.20)
RDW: 16.5 % — ABNORMAL HIGH (ref 11.5–14.5)
WBC: 7.5 10*3/uL (ref 3.6–11.0)

## 2013-11-19 ENCOUNTER — Emergency Department: Payer: Self-pay | Admitting: Emergency Medicine

## 2013-12-11 DIAGNOSIS — M1120 Other chondrocalcinosis, unspecified site: Secondary | ICD-10-CM | POA: Insufficient documentation

## 2014-02-25 ENCOUNTER — Ambulatory Visit: Payer: Medicare Other | Admitting: Podiatry

## 2014-02-28 ENCOUNTER — Ambulatory Visit (INDEPENDENT_AMBULATORY_CARE_PROVIDER_SITE_OTHER): Payer: Medicare Other

## 2014-02-28 ENCOUNTER — Encounter: Payer: Self-pay | Admitting: Podiatry

## 2014-02-28 ENCOUNTER — Ambulatory Visit (INDEPENDENT_AMBULATORY_CARE_PROVIDER_SITE_OTHER): Payer: Medicare Other | Admitting: Podiatry

## 2014-02-28 VITALS — BP 142/80 | HR 81 | Resp 16 | Ht 65.0 in | Wt 144.0 lb

## 2014-02-28 DIAGNOSIS — M779 Enthesopathy, unspecified: Secondary | ICD-10-CM

## 2014-02-28 DIAGNOSIS — E039 Hypothyroidism, unspecified: Secondary | ICD-10-CM | POA: Insufficient documentation

## 2014-02-28 DIAGNOSIS — M1990 Unspecified osteoarthritis, unspecified site: Secondary | ICD-10-CM | POA: Insufficient documentation

## 2014-02-28 DIAGNOSIS — I10 Essential (primary) hypertension: Secondary | ICD-10-CM | POA: Insufficient documentation

## 2014-02-28 DIAGNOSIS — K219 Gastro-esophageal reflux disease without esophagitis: Secondary | ICD-10-CM | POA: Insufficient documentation

## 2014-02-28 DIAGNOSIS — G939 Disorder of brain, unspecified: Secondary | ICD-10-CM | POA: Insufficient documentation

## 2014-02-28 DIAGNOSIS — M10071 Idiopathic gout, right ankle and foot: Secondary | ICD-10-CM

## 2014-02-28 DIAGNOSIS — Z882 Allergy status to sulfonamides status: Secondary | ICD-10-CM | POA: Insufficient documentation

## 2014-02-28 DIAGNOSIS — K5790 Diverticulosis of intestine, part unspecified, without perforation or abscess without bleeding: Secondary | ICD-10-CM | POA: Insufficient documentation

## 2014-02-28 DIAGNOSIS — I6782 Cerebral ischemia: Secondary | ICD-10-CM | POA: Insufficient documentation

## 2014-02-28 DIAGNOSIS — I4891 Unspecified atrial fibrillation: Secondary | ICD-10-CM | POA: Insufficient documentation

## 2014-02-28 DIAGNOSIS — J309 Allergic rhinitis, unspecified: Secondary | ICD-10-CM | POA: Insufficient documentation

## 2014-02-28 DIAGNOSIS — Z9229 Personal history of other drug therapy: Secondary | ICD-10-CM | POA: Insufficient documentation

## 2014-02-28 DIAGNOSIS — B351 Tinea unguium: Secondary | ICD-10-CM | POA: Diagnosis not present

## 2014-02-28 DIAGNOSIS — Z9109 Other allergy status, other than to drugs and biological substances: Secondary | ICD-10-CM | POA: Insufficient documentation

## 2014-02-28 DIAGNOSIS — L309 Dermatitis, unspecified: Secondary | ICD-10-CM | POA: Insufficient documentation

## 2014-02-28 DIAGNOSIS — J45909 Unspecified asthma, uncomplicated: Secondary | ICD-10-CM | POA: Insufficient documentation

## 2014-02-28 DIAGNOSIS — Z79899 Other long term (current) drug therapy: Secondary | ICD-10-CM | POA: Insufficient documentation

## 2014-02-28 DIAGNOSIS — H269 Unspecified cataract: Secondary | ICD-10-CM | POA: Insufficient documentation

## 2014-02-28 DIAGNOSIS — E785 Hyperlipidemia, unspecified: Secondary | ICD-10-CM | POA: Insufficient documentation

## 2014-02-28 MED ORDER — TRIAMCINOLONE ACETONIDE 10 MG/ML IJ SUSP
10.0000 mg | Freq: Once | INTRAMUSCULAR | Status: AC
  Administered 2014-02-28: 10 mg

## 2014-03-02 NOTE — Progress Notes (Signed)
Subjective:     Patient ID: [name], female   DOB: 1925-03-31, 80 y.o.   MRN: [id number]  HPIpatient presents stating my right foot has been hurting me a lot recently and I need medication to try to help   Review of Systems     Objective:   Physical Exam Neurovascular status unchanged with patient noted to have a inflamed area at the anterior tibial insertion right with fluid buildup noted around the area. She had had a previous tear of her left anterior tibial tendon which is really left her with no long-term problems but is having pain now with the right    Assessment:     Tendinitis of the right anterior tibial insertion    Plan:     Reviewed condition and did a careful sheath injection right anterior tibial 3 mg dexamethasone Kenalog 5 mg Xylocaine and before doing the procedure did discuss chances for tear associated with this

## 2014-03-06 ENCOUNTER — Ambulatory Visit: Payer: Self-pay | Admitting: Family Medicine

## 2014-03-15 ENCOUNTER — Ambulatory Visit: Payer: Self-pay | Admitting: Physician Assistant

## 2014-08-15 NOTE — Consult Note (Signed)
   General Aspect 81 yo female with history of intermitant afib who presented to the er with several day history of afib with rvr. She had been anticoagulated with Pradaxa and has been on amiodarone at 200 mg daily with fairly good control of her afib. She states that her episodes of afib are usually very short. She presented with a prolonged episode of afib and was noted to have rvr. She was given iv cardizem bolus and placed on an iv cardizem drip and maintatined on her amidoarone and pradaxa. She states she has been compliant with her pradaxa. She remians in afib with controlled vr.   Physical Exam:  GEN well developed, thin   HEENT PERRL, hearing intact to voice   NECK supple   RESP no use of accessory muscles   CARD Irregular rate and rhythm  Murmur   Murmur Systolic   Systolic Murmur axilla   ABD denies tenderness  normal BS  no Adominal Mass   LYMPH negative neck, negative axillae   EXTR negative cyanosis/clubbing, negative edema   SKIN normal to palpation   NEURO cranial nerves intact, motor/sensory function intact   PSYCH A+O to time, place, person, anxious   Review of Systems:  Subjective/Chief Complaint irregular heart rate   General: Fatigue  Weakness   Skin: No Complaints   ENT: No Complaints   Eyes: No Complaints   Neck: No Complaints   Respiratory: No Complaints   Cardiovascular: Palpitations   Gastrointestinal: No Complaints   Genitourinary: No Complaints   Vascular: No Complaints   Musculoskeletal: No Complaints   Neurologic: No Complaints   Hematologic: No Complaints   Endocrine: No Complaints   Psychiatric: No Complaints   Review of Systems: All other systems were reviewed and found to be negative   Medications/Allergies Reviewed Medications/Allergies reviewed   EKG:  Interpretation afib with variable ventricular response    Phenergan: Unknown  Sulfa drugs: Unknown  Cipro: Unknown  Codeine: Unknown  Guaifenesin:  Unknown  Proventil: Unknown   Impression Pt with history of afib which has been paroxysmal who is now admitted with a sustained episode of afib lasting 36 hours. Her rate is currently controlled with iv cardizem and amiodarone po at 200 mg dialy. she reports compliance with this and is compliant with her pradaxa which she ahs been on for more than30 days. She is symptomatic with the afib. Her rate is controlled iwth the cardizem.   Plan 1. Continue with pradaxa. 2. Will give a 150 mg bolus of amiodarone iv 3. Follow heart rate and rhtyhm 4. If pt remains in afib, will consider cardioversion in am 5. Furhter recs pending course.   Electronic Signatures: Teodoro Spray (MD)  (Signed 10-Feb-14 21:43)  Authored: General Aspect/Present Illness, History and Physical Exam, Review of System, EKG , Allergies, Impression/Plan   Last Updated: 10-Feb-14 21:43 by Teodoro Spray (MD)

## 2014-08-15 NOTE — Discharge Summary (Signed)
PATIENT NAME:  [name], [name] MR#:  [id number] DATE OF BIRTH:  27-Sep-1924  DATE OF ADMISSION:  02/25/2013 DATE OF DISCHARGE:  02/27/2013   ADMISSION DIAGNOSES:  1. Leukocytosis.  2. Body pain.   DISCHARGE DIAGNOSES:  1. Body pain, predominantly located in the cervical area.  2. Leukocytosis.  3. Hyponatremia.  4. Hypokalemia.  5. Hypertension.  6. Hypothyroidism.  7. Chronic Lower back pain  CONSULTATIONS: Meda Klinefelter., MD   IMAGING: The patient had a CT scan with contrast of the lumbar spine which showed multilevel spondylosis. There is moderate to severe central canal stenosis at L4-L5. No abscess. CT of the cervical spine with contrast showed no evidence of diskitis or epidural abscess on enhanced CT. There is pannus located at C1-C2.   LABORATORY DATA: Blood cultures are negative. Body fluid cultures were negative. ANA panel was negative. CRP was high at 253.6. ESR was high at 68.   HOSPITAL COURSE: A very pleasant 81 year old female with known back pain and stenosis, who presented with all-over body pain and leukocytosis. For further details, please refer to the H and P.   1. Body pain, predominantly located in the cervical area. The patient had a very painful neck. She had difficulty and extreme pain initially to move it. She was very uncomfortable. She had no symptoms of meningitis such as photophobia. She had a recent epidural on the lumbar spine for her chronic back pain. I consulter Dr. Jefm Bryant regarding her neck and shoulder pain and also obtained ESR and CRP, which were both elevated.  Dr. Jefm Bryant recommended CT of the cervical spine, and we also did a CT of the lumbar spine, since the patient had a pacemaker and was unable to get an MRI, to evaluate for diskitis/abscess. We did not find evidence of this.Her initial noncontrast cervical CT scan showed pannus or inflammation at C1-C2. It was a very puzzling case. Her neck pain improved within 3 days with the only  treatment being  antibiotics and pain medications.She was empirically on Zosyn and Vancomycin as she also had an elevated wbc on admission. Dr.Kernodle recommended p.o. antibiotics and a low dose of prednisone at discharge as she does have evidence of pannus on the C1-C2 level, which is usually seen in rheumatoid arthritis patients as per Dr. Jefm Bryant and this patient certainly does not exhibit any signs of rheumatoid arthritis. Her neck symptoms have improved. PT recommended home health; however, the patient's sister lives really close by her and will watch over the patient.  2. Leukocytosis. The patient was on Zosyn and vancomycin as there was concern for possible diskitis and abscess, but this was not seen on CT scan with contrast, as noted above. We are not sure exactly what we are treating, but she will be discharged with p.o. Keflex. Blood cultures are negative to date. She also had what appeared to be an area of inflammation on her left hand. Dr. Jefm Bryant was able to aspirate this, but the cultures have been negative, however, the patient had been on antibiotics. This was also tested for crystals which wre not seen.  3. Hyponatremia from poor p.o. intake and nausea, improved.  4. Hypokalemia, repleted.  5. Hypertension. She actually had low to normal blood pressure, so we held her HCTZ as she was with hyponatremia.  6. Hypothyroidism. The patient was continued on Synthroid.    DISCHARGE MEDICATIONS:  1. Keflex 500 mg t.i.d. for 10 days.  2. Skelaxin 800 mg t.i.d. p.r.n. pain.  3.  Lidocaine 5% to affected area daily.  4. Prednisone 5 mg b.i.d.  5. Pradaxa 150 b.i.d.  6. Vitamin D3 1000 international units in the morning.  7. Ranitidine 150 b.i.d.  8. K-Chlor 20 mEq daily.  9. Fluticasone nasal spray 2 sprays p.r.n.  10. Docusate 100 mg b.i.d.  11. Calcium 600 plus D b.i.d.  12. Ventolin HFA 2 puffs q.i.d. p.r.n.  13. Claritin 10 mg daily.  14. Symbicort 2 puffs b.i.d.  15. Synthroid 150  mcg daily.  16. Singulair 10 mg daily.  17. Atorvastatin 10 mg daily.   DISCHARGE DIET: Regular diet.   DISCHARGE ACTIVITY: As tolerated.   DISCHARGE FOLLOWUP: The patient will follow up with Dr. Hoy Morn and Dr.Kernodle within 1 week.  TIME SPENT: Approximately 35 minutes.   The patient is medically stable for discharge.  ____________________________  P. Benjie Karvonen, MD spm:lb D: 02/27/2013 14:47:59 ET T: 02/27/2013 15:24:16 ET JOB#: [id number]  cc:  P. Benjie Karvonen, MD, <Dictator> Eugene Gavia Leeanne Mannan., MD Kerin Perna, MD Donell Beers  MD ELECTRONICALLY SIGNED 02/27/2013 20:50

## 2014-08-15 NOTE — Discharge Summary (Signed)
PATIENT NAME:  [name], [name] MR#:  [id number] DATE OF BIRTH:  May 16, 1924  DATE OF ADMISSION:  06/03/2012 DATE OF DISCHARGE:  06/05/2012  PRIMARY CARE PHYSICIAN: Hortencia Pilar, MD  CARDIOLOGIST: Serafina Royals, MD  FINAL DIAGNOSES: 1. Atrial fibrillation with rapid ventricular response.  2. Hypertension.  3. Hypothyroidism.  4. Asthma.  5. Hyperlipidemia.  6. Vitamin D deficiency.   DISCHARGE DIAGNOSES: 1. Levothyroxine 150 mcg daily.  2. Clobetasol 0.05% topical cream to affected area twice a day as needed. 3. Pradaxa 150 mg twice a day. 4. Colace 100 mg twice a day. 5. Vitamin D2 50,000 units once a month. 6. Omega-3 polyunsaturated fatty acid oral capsule 2 grams daily. 7. Cardizem 30 mg 1 tablet 3 times a day as needed for heart rate greater than 110.  8. Amiodarone 200 mg daily. 9. Atorvastatin 40 mg daily. 10. Symbicort 164.5 mcg/inhalation 2 puffs twice a day.  11. Calcium carbonate 600 mg twice a day.  12. Vitamin D3 1000 international units 2 tablets daily. 13. Losartan 50 mg twice a day. 14. Singulair 10 mg at bedtime. 15. Ranitidine 150 mg twice a day. 16. Zofran 4 mg every 8 hours as needed for nausea. 17. Flonase 2 sprays to each nostril daily, 50 mcg/inhalation. 18. Ventolin HFA CFC 1 puff every 6 hours as needed for shortness of breath. 19. Diltiazem 240 mg every 24 hours once a day.   NOTE: Stop taking hydrochlorothiazide and potassium.   DIET: Low sodium diet, regular consistency.   ACTIVITY: Activity as tolerated.  DISCHARGE FOLLOWUP: With cardiology, Dr. Nehemiah Massed, in 1 to 2 weeks. Followup with Dr. Hortencia Pilar in 1 to 2 weeks.  REASON FOR ADMISSION: The patient was admitted 06/03/2012 with chest pain to the neck with rapid atrial fibrillation.   HISTORY OF PRESENT ILLNESS: This is an 81 year old female with known history of atrial fibrillation on amiodarone, Cardizem and Pradaxa. She presented to the ER with chest tightness, in rapid atrial  fibrillation, given an IV diltiazem bolus and started on a drip and was admitted to the CCU. Serial cardiac enzymes were ordered.   LABORATORY AND RADIOLOGICAL DATA DURING HOSPITAL COURSE: Included an EKG with atrial fibrillation, rapid ventricular response.   Chest x-ray: Mild opacity of the left cardiophrenic angle, may be secondary to atelectasis.   BNP 1106. Troponin negative at 0.02. White blood cell count 12.9, H and H 13.7 and 40.8 and  platelet count of 396. Glucose 93, BUN 16, creatinine 0.93, sodium 138, potassium 3.9, chloride 103, CO2 27 and calcium 8.6. Liver function tests normal. Urinalysis negative. Next 2 sets of troponins were negative. LDL 33, HDL 58 and triglycerides 104. TSH 1.96.   Pulse ox on room air 94%.  Echocardiogram showed EF greater than 55%, left atrial enlargement, mitral leaflets thickened but open well, aortic valve is trileaflet.  Repeat chest x-ray showed no acute cardiopulmonary disease. Small nodular density over the left mid lung. May be secondary to vessel on end. Followup PA and lateral chest radiograph recommended.  EKG after cardioversion showed sinus rhythm, first-degree AV block, left axis deviation.   HOSPITAL COURSE PER PROBLEM LIST:  1. For the patient's rapid atrial fibrillation, the patient was admitted to the CCU and started on Cardizem drip. The patient became rate controlled but did not convert. The patient was tapered off the Cardizem drip and Cardizem was increased. Seen in consultation by Dr. Ubaldo Glassing then Dr. Nehemiah Massed, cardiology. Dr. Ubaldo Glassing ordered an amiodarone bolus which did not convert the  patient. The patient was watched again overnight and cardioverted on the day of discharge to normal sinus rhythm. The patient felt well after cardioversion and can go back home on anticoagulation of Pradaxa 150 mg twice a day, amiodarone and increased dose of Cardizem CD. Follow up closely with Dr. Nehemiah Massed as outpatient.  2. For the patient's hypertension,  since I increased the Cardizem CD, I stopped the hydrochlorothiazide. Blood pressure upon discharge 126/65. 3. Hypothyroidism. The patient is on levothyroxine. TSH in normal range.  4. Asthma. Respiratory status stable. On inhalers. No exacerbation during this hospital stay.  5. Hyperlipidemia. The patient is on Lipitor. Cholesterol profile excellent.  6. Vitamin D deficiency. On vitamin D supplementation.  7. There were no signs of congestive heart failure on this hospital stay. Chest x-ray was negative on 2 occasions. Lungs were clear. The patient was actually on IV fluids. There was no congestive heart failure on this hospitalization.  ____________________________ Tana Conch. Leslye Peer, MD rjw:sb D: 06/05/2012 13:00:18 ET T: 06/05/2012 13:52:28 ET JOB#: [id number]  cc: Tana Conch. Leslye Peer, MD, <Dictator> Kerin Perna, MD Corey Skains, MD Marisue Brooklyn MD ELECTRONICALLY SIGNED 06/13/2012 13:36

## 2014-08-15 NOTE — Consult Note (Signed)
PATIENT NAME:  [name], SIELOFF MR#:  [id number] DATE OF BIRTH:  27-Oct-1924  DATE OF CONSULTATION:  02/25/2013  REFERRING PHYSICIAN:  Dr. Benjie Karvonen.  CONSULTING PHYSICIAN:  Meda Klinefelter., MD  PERSONAL PHYSICIAN:  Dr. Hoy Morn.  REASON FOR CONSULTATION:  Neck pain, hand swelling and elevated sed rate.   An 81 year old white female. She has a history of lumbar disk disease, as well as right hip osteoarthritis. She has previously had her right hip injected and previously had her lumbar spine epidural injected, most recently about 2 weeks ago. She had zoster about 2 months ago. Said she had complications from the medicine. She has known cardiac disease and is on Pradaxa and has had atrial fibrillation, as well as pacemaker. She awakened 2 days ago with severe pain in her neck and spine, said she could hardly move her legs, hurt some in her feet and in her legs. The family brought a walker.  She could not really move her neck. Not sure that she ever had fever or chill. She was in such pain she came to the Emergency Room and was admitted. She had a CT of the neck, which was abnormal with question pannus around the odontoid. It was done without contrast. She had an abnormality disk space at C5-C6. She had persistent leukocytosis, elevated sed rate and CRP. She had chest and abdominal CT scan with no significant abnormality, mild atelectasis versus infiltrate in her chest. Her CRP was 215. Sedimentation rate started at 28 and then repeat was 66. Today, she developed left hand swelling, was erythematous and sore to touch, and no other joints have been swollen. She has never had podagra. She has never had gout. She vomited today. Antibiotics were stopped today, as initial blood culture was negative, though she is still has a leukocytosis. She has not had jaw pain. There has been no mental status change. She has not had change in her vision. Knees have not been swelling.   PAST MEDICAL HISTORY: Cardiac  disease with valvular heart disease, paroxysmal atrial fibrillation status post AV node ablation and pacemaker placement, hypertension, hyperlipidemia, peripheral vascular disease, prior CVA, hypothyroid, COPD.   PAST SURGICAL HISTORY:  Pacemaker placement, bilateral cataracts.   SOCIAL HISTORY: No cigarettes or alcohol.   FAMILY HISTORY: Father had leukemia.   REVIEW OF SYSTEMS: No weight change. No skin rash. No chest pain. No shortness of breath. No abdominal pain. No significant swelling. There has been no change in her thinking, by her report.   MEDICATIONS: Previously on Lipitor, Pradaxa, Singulair, omeprazole, albuterol, Synthroid, hydrochlorothiazide, tramadol.   PHYSICAL EXAMINATION: GENERAL: Oriented female but does not want to move her neck at all because of pain. Says it hurts into the right shoulder, hurts when she moves her neck. It is hard to get comfortable. VITAL SIGNS: Temperature 98, blood pressure 130/60, O2 sat 88, pulse 73.  SKIN:  With erythema over the dorsum of the wrist. It is not really in the left wrist joint. It is not really in the thumb MCP, but sort of between the MCP and the wrist on the left. It is palpable. Feels like there might be a mass. No other skin rash. No changes in her nail beds.  HEENT: Sclerae clear. Clear oropharynx.  CHEST: Clear.  HEART: No significant murmur.  ABDOMEN: No visceromegaly.  EXTREMITIES: Trace edema.  MUSCULOSKELETAL:  Does not want to move her neck in any position. Right shoulder has mild pain with abduction and external rotation but  has no effusion. Left shoulder moves well. The left wrist has no definite synovitis. Erythema is over the left thumb CMC as noted, but the MCPs, PIPs of the left hand and the right hand are without synovitis. The right wrist has no synovitis. No elbow synovitis. Decreased internal and external rotation of the right hip. Left hip moves reasonably. Both knees without effusions. Ankles and toes without  synovitis. She has brisk lower extremity reflexes. Plantar and dorsiflexion are 5/5 in the bed testing. I set her up, but she did not want to stay in that position because of pain in the neck.   PROCEDURE: Left wrist erythematous area prepped in a sterile manner, aspirated less than a millimeter of purulent material, sent to microscopy for culture, cell count and crystal count if able to perform the latter.   IMPRESSION: 1.  Suspect epidural abscess versus diskitis of the cervical spine given severe pain, leukocytosis, sudden onset.  2.  Erythema of the left wrist.  Suspect infection, not necessarily crystalline.  3.  Leukocytosis, elevated sed rate, probably related to above, less likely crystalline arthritis.  4.  Known osteoarthritis of the lumbar spine and right hip.  5.  Cardiac disease with atrial fibrillation status post procedure and pacemaker, on chronic Pradaxa.   RECOMMENDATIONS: 1.  Discussed with Dr. Clayton Bibles of the hospitalist service. Recommended 2 blood cultures tonight and resume her antibiotics.  2.  The culture was taken by me to the lab.  3.  Will need infectious disease and possibly neurosurgery opinion.  4.  The hand was cultured and may need surgical opinion.   ____________________________ Meda Klinefelter., MD gwk:dmm D: 02/25/2013 20:00:00 ET T: 02/25/2013 20:25:12 ET JOB#: [id number]  cc: Meda Klinefelter., MD, <Dictator> Corey Skains, MD Dr. Jasper Riling, MD [place], DO Ovidio Hanger MD ELECTRONICALLY SIGNED 02/26/2013 12:31

## 2014-08-15 NOTE — Discharge Summary (Signed)
PATIENT NAME:  [name], [name] MR#:  [id number] DATE OF BIRTH:  1924-10-30  DATE OF ADMISSION:  06/14/2012 DATE OF DISCHARGE:  06/15/2012  ADMITTING DIAGNOSIS: Palpitations, chest tightness, which was brief.   DISCHARGE DIAGNOSES:  1.  Chest palpitations of unclear etiology. Recently converted to sinus rhythm by cardioversion according to the patient for atrial fibrillation. Not noted to be in atrial fibrillation. Chest pressure with negative cardiac enzymes. No further symptoms. She will follow up with her cardiologist as outpatient.  2.  Hypertension.  3.  History of atrial fibrillation, status post cardioversion.  4.  Previous history of cerebrovascular accident.  5.  Chronic obstructive pulmonary disease.  6.  Hypothyroidism.  7.  Valvular heart disease.  8.  Hyperlipidemia.   CONSULTANTS: None.   PERTINENT LABORATORIES AND EVALUATIONS: Admitting EKG showed sinus rhythm with first degree AV block with PACs. INR was 1.4 on admission. TSH 3.85. CPK was 48, CK-MB less than 0.5, troponin less than 0.02. WBC count was 13.2, hemoglobin 12.8, platelet count 363. Urinalysis: Nitrites negative, leukocyte esterase negative. Chest x-ray showed hyperinflation consistent with COPD.   HOSPITAL COURSE: Please refer to H and P done by the admitting physician. The patient is an 81 year old white female with a history of hypertension, chronic A. fib and hyperlipidemia, presented to the ED with sudden onset of palpitations and chest tightness today. The patient was admitted recently with A. fib with RVR and had undergone a cardioversion. The patient thought that she had recurrence of her A. fib. That is why she came to the ED when her symptoms started. The patient was noted to be in sinus rhythm. She did have some chest pressure with these palpitations, but it resolved by the time she got here. Her evaluation here was nonrevealing. The patient had no further symptoms and was very anxious to go home. At this  time, she is doing much better and is stable for discharge. She has an appointment to see Dr. Nehemiah Massed of cardiology next week on Tuesday.   DISCHARGE MEDICATIONS: Docusate 100 mg 1 tab p.o. b.i.d., amiodarone 200 daily, atorvastatin 40 mg daily, calcium 1 tab p.o. twice a day, vitamin D3 at 1000 international units 2 tabs daily, losartan 50 one tab p.o. b.i.d., Singulair 10 mg at bedtime, ranitidine 150 mg 1 tab p.o. b.i.d., diltiazem 240 daily, Pradaxa 150 mg 1 tab p.o. b.i.d., Synthroid 150 mcg daily, omega-3 fatty acids daily, Symbicort 2 puffs b.i.d., Claritin 10 daily.   HOME OXYGEN: None.   DIET: Low sodium.   ACTIVITY: As tolerated.   FOLLOWUP:  Timeframe for followup with primary MD in 1 to 2 weeks. She has an appointment to see Dr. Nehemiah Massed next week. The patient is to keep that appointment as scheduled.   TIME SPENT: 35 minutes.   ____________________________ Lafonda Mosses Posey Pronto, MD shp:jm D: 06/15/2012 14:54:35 ET T: 06/15/2012 22:11:29 ET JOB#: [id number]  cc: Gibson Lad H. Posey Pronto, MD, <Dictator> Alric Seton MD ELECTRONICALLY SIGNED 06/16/2012 12:16

## 2014-08-15 NOTE — Consult Note (Signed)
Brief Consult Note: Comments: 1sudden onset severe neck pain assoc with leukocytosis,suspect discitis or epidural abscess  2 left hand erythema, aspirated purulent fluid sent to lab for gm stain/culture     rec 2 more blood cultures, resume abx, ID consult,may need neurosurgery opinion.  Electronic Signatures: Leeanne Mannan., Eugene Gavia (MD)  (Signed [id number] 19:45)  Authored: Brief Consult Note   Last Updated: 03-Nov-14 19:45 by Leeanne Mannan., Eugene Gavia (MD)

## 2014-08-15 NOTE — H&P (Signed)
PATIENT NAME:  [name], [name] MR#:  [id number] DATE OF BIRTH:  February 01, 1925  DATE OF ADMISSION:  02/24/2013  PRIMARY CARE PHYSICIAN:  Kerin Perna, MD  REFERRING PHYSICIAN:  Baird Cancer. Quale, MD  CHIEF COMPLAINT:  Back and neck pain, radiation to the body with nausea, vomiting 2 days.   HISTORY OF PRESENT ILLNESS: An 81 year old Caucasian female with a history of hypertension, COPD, CVA, chronic A. fib on Pradaxa, presented to the ED with the above chief complaint. The patient is alert, awake, oriented, in no acute distress. The patient had an MRI of the spine this March with a diagnosis of DJD.  The patient got epidural injection 2 weeks ago due to back pain and hip pain. The patient was fine until 2 days ago. The patient started to have back pain initially and then developed neck pain with radiation to her arms, hips and legs,  exacerbated by movement. The patient feels whole-body pain, 8 out of 10, but the patient denies any fever or chills, no headache or dizziness but had some nausea, vomiting. The patient's WBC is 17. Urinalysis is negative. Chest x-ray negative. Since the patient has back pain, neck pain, the patient underwent CT angio with chest and pelvis and abdomen which did not show any PE or dissection but only mild degenerative changes. The patient got a blood culture in the ED and was treated with vanc and Zosyn.   PAST MEDICAL HISTORY: Hypertension, COPD, CVA, chronic A. fib on Pradaxa, hypothyroidism, valvular heart disease, hyperlipidemia.   SOCIAL HISTORY: No smoking or drinking or illicit drug.   PAST SURGICAL HISTORY: Pacemaker placement.   FAMILY HISTORY: Leukemia.   REVIEW OF SYSTEMS: CONSTITUTIONAL: The patient denies any fever or chills. No headache or dizziness but has weakness and body pain.  EYES: No double vision or blurred vision.  ENT: No postnasal drip, slurred speech or dysphagia.  CARDIOVASCULAR: No chest pain, palpitation, orthopnea or nocturnal dyspnea. No  leg edema.  PULMONARY: No cough, sputum, shortness of breath or hemoptysis.  GASTROINTESTINAL: No abdominal pain but had some nausea, vomiting. No diarrhea. No melena or bloody stool.  GENITOURINARY: No dysuria, hematuria or incontinence.  SKIN: No rash or jaundice.  MUSCULOSKELETAL: Neck pain, back pain, hip pain and generalized body pain.  NEUROLOGIC: No syncope, loss of consciousness or seizure.  HEMATOLOGY: No easy bruising or bleeding.  ENDOCRINE: No polyuria, polydipsia, heat or cold intolerance.   ALLERGIES: CIPRO, CODEINE, GUAIFENESIN, PHENERGAN, PROVENTIL, SULFA DRUGS, VALTREX.   HOME MEDICATIONS: 1.  Vitamin D3, 1000 international units, 2 tablets p.o. once a day.  2.  Ventolin HFA CFC-free 90 mcg inhalation aerosol 2 puffs inhaled 4 times a day.  3.  Synthroid 150 mcg p.o. daily.  4.  Symbicort 160 mcg/4.5 mcg inhalation aerosol 2 puffs b.i.d.  5.  Singulair 10 mg p.o. daily at bedtime.  6.  Ranitidine 150 mg p.o. b.i.d.  7.  Potassium chloride 20 mEq daily.  8.  HCTZ 12.5 mg p.o. daily.  9.  Fluticasone inhaled.  10.  Colace p.o.  11.  Claritin 10 mg p.o. daily.  12.  Calcium 600 mg p.o. b.i.d.  13.  Atorvastatin 40 mg p.o. once a day.  14. Pradaxa $RemoveBef'150mg'HeTYvRlpMi$  po b.i.d.  PHYSICAL EXAMINATION: VITAL SIGNS: Temperature 97.8, blood pressure 132/59, pulse 72, respirations 20, O2  saturation 95% on room air.  GENERAL: The patient is alert, awake, oriented, in no acute distress.  HEENT: Pupils round, equal and reactive to light and  accommodation. Dry oral mucosa. Clear oropharynx.  NECK: Supple. No JVD or carotid bruits. No lymphadenopathy. No thyromegaly.  CARDIOVASCULAR: S1, S2. Regular rate, rhythm. No murmurs or gallops.  PULMONARY: Bilateral air entry. No wheezing or rales. No use of accessory muscles to breathe.  ABDOMEN: Soft. No distention or tenderness. No organomegaly. Bowel sounds present.  EXTREMITIES: No edema, clubbing or cyanosis. No calf tenderness. Strong  bilateral pedal pulses. No tenderness on palpation but has body pain exacerbated by movement.  SKIN: No rash or jaundice.  NEUROLOGY: A and O x 3. No focal deficit. Power 5 out of 5. Sensation intact.   LABORATORY, DIAGNOSTIC AND RADIOLOGICAL DATA:  1.  CT cervical spine without contrast showed no acute cervical spine bony abnormality, typical degenerative changes.  2.  Chest x-ray chronic fibrotic changes, pacemaker present. No acute abnormality.  3.  CK 96, CK-MB 2.1.  4.  WBC 17.4, hemoglobin 13, platelets 297.  5.  Troponin less than 0.02.  6.  Glucose 94, BUN 12, creatinine 0.78, sodium 131, potassium 3.7, chloride 96, bicarb 28.  7.  Urinalysis is negative.  8.  ESR 28.  0.  EKG shows electronic ventricular pacemaker rhythm at 71 bpm.   IMPRESSION: 1.  Body pain with leukocytosis, unclear etiology.  2.  Hyponatremia.  3.  Hypertension.  4.  Chronic obstructive pulmonary disease.  5.  History of atrial fibrillation.  6.  Hyperlipidemia.  7.  Hypothyroidism.   PLAN OF TREATMENT: 1.  The patient will be placed for observation. We will follow up blood culture, follow up CBC and pain control.  2.  We will continue HCTZ for hypertension but hold statin due to body pain.  3.  We will given normal saline IV, follow up BMP.  4.  Continue nebulizer.  5.  Continue pradaxa.  I discussed the patient's condition and plan of treatment with the patient.  The patient wants full code.   TIME SPENT: About 55 minutes.   ____________________________ Demetrios Loll, MD qc:cs D: 02/24/2013 13:23:06 ET T: 02/24/2013 14:03:15 ET JOB#: [id number]  cc: Demetrios Loll, MD, <Dictator> Demetrios Loll MD ELECTRONICALLY SIGNED 02/27/2013 13:07

## 2014-08-15 NOTE — Consult Note (Signed)
Brief Consult Note: Comments: much better, much less joint pain, still some neck pain, left hand not as painful;afebrile, less erythema left hand, no rash or synovitis, mild decreased rom of cervical spine.; wbc nl,CT contrast shows pannus, like seen in long standing RA (no hx or exam findings thereof) , cultures neg, including left hand                                 rec 1 watch cultures today , consider convert to oral abx, hesitant to not give course of abx given her response ,2 hesitant to use NSAIDS given her pradaxa, could give low dose(not high dose) prednisone as an antiinflammatory(5mg  bid) till sorts out. repeat esr and rheum factor sent.3 happy to follow up in office.  Electronic Signatures: Leeanne Mannan., Eugene Gavia (MD)  (Signed [phone] 08:25)  Authored: Brief Consult Note   Last Updated: 05-Nov-14 08:25 by Leeanne Mannan., Eugene Gavia (MD)

## 2014-08-15 NOTE — H&P (Signed)
PATIENT NAME:  OLLA, Tanya [name] MR#:  [id number] DATE OF BIRTH:  02-Sep-1924  DATE OF ADMISSION:  06/14/2012  PRIMARY CARE PHYSICIAN:  Dr. Hoy Morn  REFERRING PHYSICIAN:  Dr. Mariea Clonts  CHIEF COMPLAINT: Palpitations, chest tightness today.   HISTORY OF PRESENT ILLNESS: An 81 year old Caucasian female with [name] history of hypertension, chronic [name]-fib on Pradaxa, hyperlipidemia who presented to the ED with [name] sudden onset of palpitations and chest tightness today. The patient has difficulty breathing during MRI of back. She developed dizziness, headache, palpitation and weakness. She feels she had [name]-fib again, so MRI test was stopped. The patient was sent home, but the patient still feels chest tightness and palpitations so she came to ED for further evaluation. The patient has chronic right lower extremity pain, so she got an MRI. The patient denies any fever, chills. No cough, sputum, shortness of breath, hemoptysis. The patient denies any dysuria or hematuria. No weight loss or weight gain. No leg edema.   PAST MEDICAL HISTORY:  Hypertension, chronic [name]-fib on Pradaxa; history of stroke, COPD, hypothyroidism, valvular heart disease and hyperlipidemia.   SOCIAL HISTORY:  No smoking, alcohol drinking or illicit drugs.   FAMILY HISTORY:  Positive for leukemia.  REVIEW OF SYSTEMS:  CONSTITUTIONAL: The patient denies any fever or chills but has headache, dizziness. No weight gain or weight loss, but has generalized weakness.  EYES:  No double vision or blurry vision. ENT:  No postnasal drip, slurred speech or dysphagia.  CARDIOVASCULAR: Positive for chest tightness and palpitations but no leg swelling, orthopnea or nocturnal dyspnea. No leg edema.  PULMONARY:  No cough, sputum, shortness of breath or hematemesis.   GASTROINTESTINAL:  The patient has nausea for 1 week, but no vomiting, diarrhea, melena or bloody stool.  GENITOURINARY:  No dysuria, hematuria or incontinence.  SKIN:  No rash or jaundice.   NEUROLOGY:  No syncope, loss of consciousness or seizure.  HEMATOLOGY:  No easy bruising or bleeding.  ENDOCRINE:  No polyuria, polydipsia, heat or cold intolerance.   ALLERGIES:  CIPRO, CODEINE, GUAIFENESIN, PHENERGAN, PROVENTIL, SULFA DRUGS.   HOME MEDICATIONS: 1.  Vitamin D3 1000 units 2 tablets once [name] day. 2.  Synthroid 150 mcg 1 tablet once [name] day. 3.  Symbicort 160 mcg/4.5 mcg two puffs inhaled b.i.d. 4.  Singulair 10 mg p.o. tablets once [name] day at bedtime. 5.  Ranitidine 150 mg 1 tablet b.i.d. 6. Probiotic formula oral capsule, 1 capsule once [name] day. 7.  Pradaxa 150 mg p.o. b.i.d.  8.  Omega 3 and 1 cap once [name] day.  9.  Losartan 50 mg p.o. b.i.d.  10.  Colace 100 mg p.o. b.i.d.  11.  Diltiazem 240 mg p.o. once [name] day.  12.  Claritin 10 mg p.o. daily.  13.  Zocor 40 mg p.o. daily.  14.  Amiodarone 200 mg p.o. once [name] day.   PHYSICAL EXAMINATION: VITAL SIGNS: Temperature 98, blood pressure 165/75, pulse 78, oxygen saturation 96% on room air.  GENERAL: The patient is alert, awake, oriented, in no acute distress.  HEENT:  Pupils round, equal, reactive to light and accommodation. Dry oral mucosa. Clear oropharynx.  NECK:  Supple. No JVD or carotid bruits. No lymphadenopathy. No thyromegaly.  CARDIOVASCULAR:  S1, S2 regular rate, rhythm. No murmurs, gallops.  PULMONARY: Bilateral air entry. No wheezing or rales. No use of accessory muscles to breathe.  ABDOMEN:  Soft. No distention.  No tenderness. No organomegaly. Bowel sounds present.   EXTREMITIES: No edema, clubbing or cyanosis.  No calf tenderness. Bilaterally pedal pulses present.  SKIN:  No rash or jaundice.  NEUROLOGY: AO x 3. No focal deficit. Power 5 out of five. Sensation intact.  LABORATORY DATA: Urinalysis is negative. WBC 13.2, hemoglobin 12.8,  platelets 363. Troponin less than 0.02. CK 48. CK-MB less than 0.5. TSH 3.85, PTT 57.5, INR 1.4.  EKG showed sinus rhythm with first-degree AV block at 79.   IMPRESSION: 1.   Palpitation and chest tightness.  2.  Sinus rhythm with first-degree AV block.  3.  History of atrial fibrillation.  4.  Leukocytosis.  5.  Mild dehydration.  6.  Hypertension.  7.  Hyperlipidemia.  8.  Chronic obstructive pulmonary disease.  9.  Hypothyroidism.   PLAN OF TREATMENT: 1. The patient will be placed for observation. We will continue telemonitor, O2 by nasal cannula. We will follow up troponin level and give IV fluid support and follow up CBC.  2. We will continue Cardizem, amiodarone, losartan, Pradaxa and Zocor for atrial fibrillation and hypertension.  3.  Continue Singulair, Symbicort and nebulizer p.r.n.  4. GI, DVT prophylaxis.  Discussed the patient's situation and plan of treatment with the patient.   TIME SPENT:  About 52 minutes    ____________________________ Demetrios Loll, MD qc:ce D: 06/14/2012 14:59:24 ET T: 06/14/2012 15:22:59 ET JOB#: [id number]  cc: Demetrios Loll, MD, <Dictator> Demetrios Loll MD ELECTRONICALLY SIGNED 06/15/2012 14:28

## 2014-08-15 NOTE — H&P (Signed)
PATIENT NAME:  [name], [name] MR#:  [id number] DATE OF BIRTH:  31-Aug-1924  DATE OF ADMISSION:  06/03/2012  REFERRING PHYSICIAN: Dr.  Reita Cliche.   FAMILY PHYSICIAN:  Dr. Hoy Morn.   REASON FOR ADMISSION: Chest pain radiating to the neck associated with rapid atrial fibrillation.   HISTORY OF PRESENT ILLNESS: The patient is an 81 year old female followed by Dr. Hoy Morn. She sees Dr. Serafina Royals of cardiology. She has a history of chronic atrial fibrillation, on Pradaxa. Also has a history of valvular heart disease and peripheral vascular disease. She presents to the Emergency Room with a less than 24-hour history of chest pressure and tightness radiating to the neck, associated with tachy palpitations. In the Emergency Room, the patient was noted to be in rapid atrial fibrillation. She was given p.o. diltiazem followed by IV diltiazem bolus with no improvement of her symptoms. She was subsequently put on a diltiazem drip and is now admitted for further evaluation.   PAST MEDICAL HISTORY: 1.  Chronic atrial fibrillation, on Pradaxa.  2.  Previous stroke.  3.  COPD/asthma.  4.  Hypothyroidism.  5.  Valvular heart disease.  6.  Benign hypertension.  7.  Hyperlipidemia.   MEDICATIONS: 1.  Amiodarone 200 mg p.o. daily.  2.  Lipitor 40 mg p.o. at bedtime.  3.  Symbicort 2 puffs b.i.d.  4.  Pradaxa 150 mg p.o. b.i.d.  5.  Cardizem CD 180 mg p.o. daily.  6.  Flonase 2 puffs in each nostril daily.  7.  Hydrochlorothiazide 12.5 mg p.o. daily.  8.  Losartan 50 mg p.o. b.i.d.  9.  Singulair 10 mg p.o. daily.  10.  Omeprazole 20 mg p.o. daily.  11.  K-Dur 20 mEq p.o. daily.   ALLERGIES: PHENERGAN, SULFA, CIPRO, CODEINE AND  GUAIFENESIN.   SOCIAL HISTORY: Negative for alcohol or tobacco abuse.   FAMILY HISTORY: Positive for leukemia but otherwise unremarkable.   REVIEW OF SYSTEMS: CONSTITUTIONAL: No fever or change in weight.  EYES: No blurred or double vision. No glaucoma.  ENT: No tinnitus  or hearing loss. No nasal discharge or bleeding. No difficulty swallowing.  RESPIRATORY: The patient denies cough or wheezing. No hemoptysis. No painful respiration.  CARDIOVASCULAR: Chest pain as per HPI. No orthopnea. No syncope.  GASTROINTESTINAL:  No nausea, vomiting or diarrhea. No change in bowel habits. No abdominal pain.  GENITOURINARY:  No dysuria or hematuria. No incontinence.  ENDOCRINE: No polyuria or polydipsia. No heat or cold intolerance.  HEMATOLOGIC: The patient denies anemia, easy bruising or bleeding.  LYMPHATIC: No swollen glands.  MUSCULOSKELETAL: The patient denies pain in her neck, back, shoulders, knees or hips. No gout.  NEUROLOGIC: No numbness, although she does have weakness. Denies migraines or seizures.  PSYCH: The patient denies anxiety, insomnia or depression.   PHYSICAL EXAMINATION: GENERAL: The patient is in no acute distress.  VITAL SIGNS: Currently remarkable for a blood pressure of 140/76 with a heart rate of 112 and a respiratory rate of 20. She is afebrile.  HEENT: Normocephalic, atraumatic. Pupils equally round and reactive to light and accommodation. Extraocular movements are intact. Sclerae anicteric. Conjunctivae are clear. Oropharynx is clear.   NECK: Supple without JVD. No adenopathy or thyromegaly was noted.  LUNGS: Basilar crackles without wheezes or rhonchi. No dullness. Respiratory effort is normal.  CARDIAC: Irregularly irregular rhythm. There is a 2/6 apical murmur noted. No rubs or gallops present.  ABDOMEN: Soft, nontender, with normoactive bowel sounds. No organomegaly or masses were appreciated. No hernias or  bruits were noted.  EXTREMITIES: Without clubbing, cyanosis, edema. Pulses were 2+ bilaterally.  SKIN: Warm and dry without rash or lesions.  NEUROLOGIC: Cranial nerves II through XII grossly intact. Deep tendon reflexes were symmetric. Motor and sensory exams nonfocal.  PSYCH: Exam revealed a patient who was alert and oriented to  person, place and time. She was cooperative and used good judgment.   LABORATORY DATA: EKG reveals atrial fibrillation at 110 beats per minute with no acute ischemic changes. Her chest x-ray revealed a questionable opacity of the left cardiophrenic angle which was presumably due to atelectasis. Urinalysis was unremarkable. White count was 12.9 with a hemoglobin of 13.7. Chemistries revealed a glucose of 93 with a BUN of 16, creatinine of 0.93, a sodium of 138 and a potassium of 3.9. Troponin was less than 0.02. BNP was 1106.   ASSESSMENT: 1.  Rapid atrial fibrillation.  2.  Chest pain worrisome for unstable angina.  3.  Mild acute systolic congestive heart failure.  4.  Valvular heart disease with mitral regurgitation.  5.  Peripheral vascular disease.  6.  Previous stroke.  7.  Hyperlipidemia.  8.  Benign hypertension.   PLAN: The patient will be admitted to the Intensive Care Unit on a diltiazem drip. We will continue her amiodarone. We will follow her heart rate and blood pressure closely. We will follow serial cardiac enzymes. We will obtain an echocardiogram. We will consult Dr. Nehemiah Massed in the morning. Follow up routine labs tomorrow including a TSH. Further treatment and evaluation will depend upon the patient's progress.   TOTAL TIME SPENT ON THIS PATIENT:  50 minutes.    ____________________________ Leonie Douglas Doy Hutching, MD jds:cs D: 06/03/2012 18:50:00 ET T: 06/03/2012 19:05:01 ET JOB#: [id number]  cc: Leonie Douglas. Doy Hutching, MD, <Dictator> Kerin Perna, MD Sharisse Rantz Lennice Sites MD ELECTRONICALLY SIGNED 06/04/2012 7:46

## 2014-08-16 NOTE — Consult Note (Signed)
Brief Consult Note: Diagnosis: multiple joint pian, right shoulder pain and swelling.   Patient was seen by consultant.   Recommend to proceed with surgery or procedure.   Recommend further assessment or treatment.   Orders entered.   Comments: will attempt aspiration of right shoulder under local anesthetic.  Electronic Signatures: Laurene Footman (MD)  (Signed 21-Apr-15 12:24)  Authored: Brief Consult Note   Last Updated: 21-Apr-15 12:24 by Laurene Footman (MD)

## 2014-08-16 NOTE — Consult Note (Signed)
Brief Consult Note: Comments: thank you for your care of this patient. agree with steriod taper for this flare of pseudogout.  Electronic Signatures: Leeanne Mannan., Eugene Gavia (MD)  (Signed 21-Apr-15 18:44)  Authored: Brief Consult Note   Last Updated: 21-Apr-15 18:44 by Leeanne Mannan., Eugene Gavia (MD)

## 2014-08-16 NOTE — Op Note (Signed)
PATIENT NAME:  [name], DUNAVAN MR#:  [id number] DATE OF BIRTH:  07/14/1924  DATE OF PROCEDURE:  08/13/2013  PREPROCEDURE DIAGNOSIS: Possible septic joint versus pseudogout, right shoulder.   POSTOPERATIVE DIAGNOSIS: Possible septic joint versus pseudogout, right shoulder.   PROCEDURE: Aspiration, right shoulder.   ANESTHESIA: Local.  SURGEON: Laurene Footman, MD  DESCRIPTION OF PROCEDURE: The procedures was performed in the patient's bed. A timeout procedure was completed along with patient identification. The anterior shoulder was prepped with alcohol, and with a 25-gauge needle, 3 mL of 1% Xylocaine was infiltrated subcutaneously. After allowing this to set, the skin was prepped with Betadine. An 18-gauge needle was inserted into the shoulder, with a small amount of fluid withdrawn. This was sent for synovial fluid analysis, in particular crystals. Her wound was covered with a Band-Aid.   ESTIMATED BLOOD LOSS: Minimal.   COMPLICATIONS: None.   SPECIMEN: Right shoulder joint fluid, small amount, for crystalline analysis.   ____________________________ Laurene Footman, MD mjm:lb D: 08/14/2013 07:03:02 ET T: 08/14/2013 07:09:36 ET JOB#: [id number]  cc: Laurene Footman, MD, <Dictator> Laurene Footman MD ELECTRONICALLY SIGNED 08/14/2013 8:47

## 2014-08-16 NOTE — Consult Note (Signed)
PATIENT NAME:  [name], [name] MR#:  [id number] DATE OF BIRTH:  09/17/1924  DATE OF CONSULTATION:  08/14/2013  REFERRING PHYSICIAN:   CONSULTING PHYSICIAN:  Laurene Footman, MD  REASON FOR CONSULTATION: Multiple joint pains.   HISTORY OF PRESENT ILLNESS: The patient is an 81 year old, who prior to admission, had been febrile and had a great deal left dorsal wrist swelling. Dr. Sammuel Bailiff had attempted aspiration of her wrist, but not obtained any fluid. She was admitted for possible infection versus polyarthritis. I was consulted to evaluate and aspirate joint to see if we could obtain specimen to determine if she might have a septic joint or pseudogout. Uric acid apparently has been negative in the past with regard to lab work.   PHYSICAL EXAMINATION: On exam, the patient is noted to have mild swelling to the dorsum of the left wrist. Apparently, this is much better since admission. She also has pain in her elbow on the left side. She holds the right arm against her side with pain with attempted motion with mild swelling to the anterior shoulder.   IMPRESSION: Possible pseudogout.  PLAN: We will aspirate right shoulder for fluid analysis and determine if it is pseudogout. If it is positive, will proceed with steroid treatment to get rid of inflammation and get the patient mobile again. ____________________________ Laurene Footman, MD mjm:aw D: 08/14/2013 07:01:50 ET T: 08/14/2013 07:12:57 ET JOB#: [id number]  cc: Laurene Footman, MD, <Dictator> Laurene Footman MD ELECTRONICALLY SIGNED 08/14/2013 8:48

## 2014-08-16 NOTE — Discharge Summary (Signed)
PATIENT NAME:  [name], [name] MR#:  [id number] DATE OF BIRTH:  24-Mar-1925  DATE OF ADMISSION:  08/12/2013 DATE OF DISCHARGE:  08/14/2013  PRESENTING COMPLAINT: Multiple joint pains.   DISCHARGE DIAGNOSES:  1.  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  2.  Chronic atrial fibrillation.  3.  Chronic anticoagulation.   PROCEDURES: Right shoulder joint aspirate.   MEDICATIONS AT DISCHARGE:  1.  Prednisone taper.  2.  Singulair 10 mg at bedtime.  3.  Synthroid 150 mcg p.o. daily.  4.  Symbicort 160/4.5, 2 puffs b.i.d.  5.  Lipitor 40 mg at bedtime.  6.  Eliquis 5 mg b.i.d.  7.  Vitamin D3 p.o. daily.  8.  Calcium carbonate 500 mg p.o. daily.  9.  Flonase 50 mcg per inhalation once a day as needed.  10. Omeprazole 20 mg daily.  11. Albuterol inhaler 2 puffs every 4 hours as needed.  12. Temovate 0.05% apply to affected area b.i.d.  13. Klor-Con 10 mEq 2 tablets once a day.  14. Xopenex nebulizer every 4 hours as needed.   FOLLOWUP: With Dr. Hoy Morn in 2 weeks.   ORTHOPEDICS CONSULTATION: With Dr. Rudene Christians.   Body fluid source, right shoulder was positive for calcium pyrophosphate crystals. White count is 9.1. UA negative for UTI. Blood cultures negative in 48 hours.   BRIEF SUMMARY OF HOSPITAL COURSE: [name] is an 81 year old Caucasian female with history of chronic A. fib., on anticoagulation, came in with:  1.  Migratory polyarthritis. The patient's symptoms were not suggestive of septic joint. She was empirically started on IV antibiotics, which were discontinued. The patient did not have any fever. White cell count was normal.  2.  Acute polys arthritis, appears to be secondary to pseudogout. The patient was started on prednisone. She felt a whole lot better after getting 1 dose of prednisone. Dr. Rudene Christians aspirated her right shoulder. Fluid was positive for calcium pyrophosphate crystals.  3.  Hyponatremia. Received IV fluids. The patient is euvolemic.  4.  Chronic A. fib.,  stable. Resumed Eliquis 1 day after the right shoulder joint was aspirated.  5.  Hospital stay otherwise remained stable. The patient was seen by physical therapy, who recommended home PT; however, the patient refused. She is being discharged to home with her sister.   CODE STATUS: The patient remained a full code.   TIME SPENT: 40 minutes.  ____________________________ Hart Rochester Posey Pronto, MD sap:aw D: 08/15/2013 07:16:56 ET T: 08/15/2013 07:32:54 ET JOB#: [id number]  cc: Rebie Peale A. Posey Pronto, MD, <Dictator> Laurene Footman, MD Kerin Perna, MD Ilda Basset MD ELECTRONICALLY SIGNED 08/19/2013 9:16

## 2014-08-16 NOTE — Discharge Summary (Signed)
PATIENT NAME:  [name], SCHAMBERGER MR#:  [id number] DATE OF BIRTH:  1924-12-16  DATE OF ADMISSION:  04/29/2013 DATE OF DISCHARGE:  05/01/2013  PRIMARY CARE PHYSICIAN: Heidi Grandis.   DISCHARGE DIAGNOSES: 1.  Acute bronchitis.  2.  Cervicalgia with degenerative disk disease.  3.  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  4.  Hypothyroidism.  5.  Chronic obstructive pulmonary disease.  6.  Hyperlipidemia.   IMAGING STUDIES: Done include a CT scan of the cervical spine and head without contrast, showed mild atrophy in the brain without any acute abnormalities.   CT cervical spine showed extensive spondylosis and osteoarthritis. No disk extrusion or stenosis. Also showed 12  mm nodular opacity of the left apex  unchanged from prior CAT scan.   ADMITTING HISTORY AND PHYSICAL AND HOSPITAL COURSE: Please see detailed H and P dictated previously. In brief, an 81 year old female patient with history of hypertension, COPD, chronic atrial fibrillation on Pradaxa, presented to the hospital with complaints of acute neck pain and headache. The patient also had acute bronchitis. The patient was thought initially to have possible meningitis with no signs of meningitis other than the neck stiffness. The patient did not have an LP done. The patient did spike fever of 100.4, was on azithromycin along with prednisone for her bronchitis with breathing treatment, which she has improved with. By the day of discharge, she is afebrile, feeling better, has ambulated on her own. Was seen by physical therapy. The patient is being referred to a spine surgeon after discharge for her significant cervicalgia and significant degenerative disk disease in the cervical spine. The patient is on room air. Does have cough, mild wheezing, which is improving.   Prior to discharge, the patient does not have any wheezing. No shortness of breath at this time, but does have a dry cough. Neck pain is slowly improving.   DISCHARGE  MEDICATIONS: Include:  1.  Skelaxin 800 mg 3 times a day as needed for muscle spasm.  2.  Percocet 1 tablet 3 times a day as needed for pain.  3.  Singulair 10 mg oral once a day.  4.  Synthroid 150 mcg oral once a day.  5.  Symbicort 160/4.5, 2 puffs inhaled 4 times a day.  6.  Claritin 10 mg daily.  7.  Ventolin HFA 2 puffs inhaled 4 times a day as needed for shortness of breath.  8.  Calcium with vitamin D 1 tablet oral 2 times a day.  9.  Docusate sodium 100 mg oral 2 times a day.  10.  Fluticasone 2 sprays in each nostril once a day as needed for congestion.  11.  Potassium chloride 20 mEq oral once a day.  13.  Vitamin D3, 1000 International Units oral once a day.  14.   Pradaxa  150 mg oral 2 times a day.  15.  Probiotic formula oral once a day.  16.  Fish oil 1200 mg oral once a day.  17.  Losartan 50 mg oral once a day.  18.  Atorvastatin 40 mg oral once a day at bedtime.  19.  Azithromycin 500 mg oral once a day.  20.  Prednisone 60 mg tapered over 6 days.   DISCHARGE INSTRUCTIONS: Low-sodium diet. Activity as tolerated without lifting any heavy weights. Follow up with primary care physician in 1 to 2 weeks. The patient will need referral to a spine surgeon for neck pain and degenerative disk disease.   Time spent on day of discharge  in discharge activity was 40 minutes.    ____________________________ Leia Alf Ritamarie Arkin, MD srs:dmm D: 05/01/2013 19:01:07 ET T: 05/01/2013 20:53:10 ET JOB#: [id number]  cc: Alveta Heimlich R. Darvin Neighbours, MD, <Dictator> Kerin Perna, MD Neita Carp MD ELECTRONICALLY SIGNED 05/02/2013 11:14

## 2014-08-16 NOTE — H&P (Signed)
PATIENT NAME:  [name], [name] MR#:  [id number] DATE OF BIRTH:  29-Dec-1924  DATE OF ADMISSION:  08/12/2013         PRIMARY CARE PHYSICIAN: Dr. Hoy Morn.  The patient is an 81 year old Caucasian female with past medical history significant for history of multiple medical problems including Afib, COPD, thyroid disease, osteoarthritis, who presented to the hospital from Dr. Scharlene Gloss office due to reddened, sore and painful joints. According to the patient, she was doing well up until approximately a few days ago when she started noticing right wrist swelling and redness, soreness and discomfort. It lasted approximately 1-1/2 days and then it went away and it occurred on the left wrist. Now, she barely can move that left wrist, and she was seen by her primary care physician, who sent her to Dr. Scharlene Gloss office for further evaluation. In Dr. Scharlene Gloss office, the patient was noted to be febrile. She was felt to have polyarthritis, since she was also complaining of right shoulder pain and the patient was sent to the Emergency Room for further evaluation. Dr. Jefm Bryant tried to get  joint synovial fluid for crystals; however, tap was dry. The patient is being admitted for possible pseudogout. She admits of having significant pain in the wrist at present, as well as right shoulder pain.   PAST MEDICAL HISTORY:  The patient does have a history of Afib, AV node ablation, COPD, thyroid disease, eczema, TIA, hypertension, osteoarthritis. Lumbar spine, hands, as well as right hip, abnormal cervical spine scan, which was concerning for possible pannus versus nodule, as well as cataracts.   PAST SURGICAL HISTORY: Significant for cataract removal bilaterally, as well as wrist surgery and pacemaker placement.   MEDICATIONS:  According to the medical record, the patient is on albuterol 2 puffs every 4 hours as needed, calcium carbonate 500 mg once daily, Eliquis 5 mg p.o. twice daily, Flonase 1 spray once daily as  needed, Klor-Con 10 mEq 2 tablets once daily, Lipitor 40 mg p.o. at bedtime, omeprazole 20 mg p.o. daily, Singulair 10 mg p.o. daily at bedtime, Symbicort 160/4.5, 2 puffs twice daily; Synthroid 150 mcg p.o. daily, Temovate topical cream 0.05% apply topically twice daily as needed, vitamin D3, 1 tablet once daily; Xopenex 3 mL every 4 hours as needed.   ALLERGIES: CIPRO, CODEINE, GUAIFENESIN, PHENERGAN, PROVENTIL, SULFA DRUGS, AS WELL AS VALTREX.   SOCIAL HISTORY: The patient is widowed, has no children. No smoking. Occasional alcohol abuse.   FAMILY HISTORY: Asthma, coronary artery disease, leukemia,  rheumatoid arthritis, stroke, thyroid disease, gout, diabetes, as well as hypertension.   REVIEW OF SYSTEMS:   CONSTITUTIONAL:  Apart from pain, she denies significant fevers or chills, fatigue, weakness or weight loss or gain.  EYES: In regards to eyes, denies any blurry vision, double vision, glaucoma or cataracts.  EARS, NOSE, THROAT: Denies any tinnitus, allergies, epistaxis, sinus tenderness, dentures or difficulty swallowing. Admits of having just getting over sinus infection. She was treated apparently for respiratory infection and got ciprofloxacin last week. Admits of having some cough and clear phlegm production. Denies any wheezes, hemoptysis, dyspnea.  CARDIOVASCULAR: Denies chest pains, orthopnea, edema, arrhythmias, palpitations or syncopal.  GASTROINTESTINAL Denies nausea, vomiting, diarrhea or constipation.  GENITOURINARY: Denies dysuria, hematuria, frequency or incontinence.  ENDOCRINE:  Denies any polydipsia, nocturia or thyroid problems, heat or cold intolerance or thirst   HEMATOLOGIC AND LYMPHATICS:  Denies anemia, easy bruising, bleeding or swollen glands.  SKIN: Admits of having erythematous rash on the left hand, as well  as increased swelling of that hand and wrist and decreased range of motion.   NEUROLOGIC: Denies any numbness, epilepsy or tremors.  PSYCHIATRIC: Denies  anxiety, insomnia or depression.   PHYSICAL EXAMINATION:  VITAL SIGNS:  On arrival to the hospital, temperature was 99.9. Pulse was 75. Respiratory rate was 18, blood pressure 127/83. Saturation was 96% on room air.  GENERAL: This is a well-developed, well-nourished, thin Caucasian female, very talkative, lying on the stretcher.  HEENT:  Her pupils equal, reactive to light.  No icterus or conjunctivitis. Has normal hearing. No pharyngeal erythema. Mucosa is moist.  NECK: Supple, nontender. No adenopathy. No JVD or carotid bruits bilaterally. Full range of motion.  LUNGS: Clear to auscultation in all fields. No rales, rhonchi, diminished breath sounds or wheezing. No labored inspirations, increased effort, dullness to percussion or overt respiratory distress.  CARDIOVASCULAR: S1, S2 appreciated. No murmurs, gallops or rubs present.  PMI not lateralized. Chest is nontender to palpation, 1+ pedal pulses.  No lower extremity edema, calf tenderness or masses were noted.  ABDOMEN: Soft, nontender. Bowel sounds are present. No hepatosplenomegaly or masses were noted.  RECTAL: Deferred.  MUSCULOSKELETAL:  Muscle strength: Able to move all extremities, except of right shoulder; the patient's range of motion is significantly increased. She is in pain whenever it is moved. The patient's left wrist has also decreased range of motion. She has significant swelling, as well as redness and increased warmth of the skin noted in the hand, as well as the wrist itself. It is hot to touch. No other rashes, lesions, erythema, nodularity or induration. Mild erythema was noted in clavicular junction with the sternum on the right, as well. No other erythema, nodularity or induration was noted. Skin was warm and dry to palpation.  LYMPHATIC: No adenopathy in the cervical region. NEUROLOGICAL: Cranial nerves grossly intact. Sensory is intact. No dysarthria or aphasia. The patient is alert and oriented to time, person and place,  cooperative. Memory is somewhat impaired. She is anxious, but no agitation was noted, or depression or confusion at this point.   LABORATORY DATA: Sodium 131 on BMP. Calcium level is low at 8.4. Albumin level of 2.7, alkaline phosphatase 130, AST 46. Otherwise, comprehensive metabolic panel was unremarkable. White blood cell count is up to 14.1. Hemoglobin was 10.7, platelet count 326. Absolute neutrophil count is not checked. Urinalysis: Yellow clear urine. Negative for glucose or bilirubin, 1+ ketones, specific gravity 1.023, pH was 5.0, 2+ blood, 30 mg/dL protein, negative for nitrites or leukocyte esterase; no red blood cells, 1 white blood cell, no bacteria or epithelial cells. Mucus was present, as well as amorphous crystal. X-ray, PA and lateral  of the chest, 08/12/2013, revealed cardiomegaly without decompensation. Left hand complete x-ray, 08/12/2013, revealed no acute fracture or subluxation. Mild degenerative changes distal interphalangeal joints. Degenerative changes of first metacarpal joint were also noted.   ASSESSMENT AND PLAN: 1. Systemic inflammatory response syndrome. Admit the patient to the medical floor. Very likely source is due to polyarthritis. Get blood cultures and start the patient on broad-spectrum antibiotic therapy with vanco and Zosyn for now.  2. Polyarthritis. Get uric acid level. Get orthopedics consult. Hopefully, orthopedic surgeon will be able to tap shoulder to look for CPPD or gout crystals. We will start the patient on colchicine every 2 hours.  We may need to add prednisone, depending on her condition.  3.  Hyponatremia, suspect intravascular depletion. We will give IV fluids for now. We will follow the patient's sodium  level.  4.  Leukocytosis. Follow bacterial infection, antibiotics, as well as cultures, and follow up with this antibiotic use.   TIME SPENT: 50 minutes.     ____________________________ Theodoro Grist, MD rv:dmm D: 08/12/2013 20:13:00  ET T: 08/12/2013 20:51:21 ET JOB#: [id number]  cc: Theodoro Grist, MD, <Dictator> Kerin Perna, MD [place] MD ELECTRONICALLY SIGNED 08/24/2013 18:54

## 2014-08-16 NOTE — H&P (Signed)
PATIENT NAME:  [name], [name] MR#:  [id number] DATE OF BIRTH:  [birth year]  DATE OF ADMISSION:  04/29/2013  PRIMARY CARE PHYSICIAN: Kerin Perna, MD   CHIEF COMPLAINT: Acute neck pain, headache.   HISTORY OF PRESENTING ILLNESS: An 81 year old Caucasian female patient with history of chronic leukocytosis, atrial fibrillation on Pradaxa, COPD, presents to the Emergency Room complaining of acute onset of headache and neck pain since yesterday. The patient was recently in the hospital for similar complaint in November 2014, when there was some concern for diskitis versus pannus. Her leukocytosis did respond to antibiotics, was discharged home. Presently is being seen by Dr. Jefm Bryant with rheumatology, and also hematology as outpatient. She did have flow cytometry done recently as outpatient which seems to suggest chronic leukemia. Here in the Emergency Room, the patient was found to have leukocytosis with neck pain, concern for meningitis. Is being admitted to rule out meningitis and other infections.   She has not had any rash. She was recently treated with doxycycline the week prior for an upper respiratory infection.   She complains of initially some muscle spasm in the left cervical area radiating to left shoulder area. Later she had pain in lower occipital area, along with lower part of the neck, and also generalized muscle pains in her legs and arms. No fever. White count is 22.9. No history of meningitis. She mentions that she was recently diagnosed with influenza a month back, was treated with Tamiflu.   PAST MEDICAL HISTORY: 1.  Hypertension.  2.  COPD.  3.  CVA.  4.  Chronic A. fib, on Pradaxa.  5.  Hypothyroidism.  6.  Valvular heart disease.  7.  Hyperlipidemia.  8.  Questionable chronic leukemia.   SOCIAL HISTORY: The patient does not smoke. Occasional alcohol. No illicit drug use. Lives alone. Ambulates on her own.   CODE STATUS: Full code.   PAST SURGICAL HISTORY: Pacemaker  placement.   FAMILY HISTORY: Leukemia.   REVIEW OF SYSTEMS:   CONSTITUTIONAL: Complains of some fatigue. No fever. Had some chills.  EYES: No blurred vision, pain, redness or photophobia.  EARS, NOSE, THROAT: No tinnitus, ear pain, hearing loss.  CARDIOVASCULAR: No chest pain, orthopnea, edema.  RESPIRATORY: Has had bronchitis recently, which seems to be resolving at this time. No shortness of breath. Has dry cough.  GASTROINTESTINAL: No nausea, vomiting, diarrhea, abdominal pain.  GENITOURINARY: No dysuria, hematuria, frequency.  ENDOCRINE: No polyuria, nocturia, thyroid problems.  HEMATOLOGIC AND LYMPHATIC: No anemia, easy bruising, bleeding.  INTEGUMENTARY: No acne, rash, lesions.  MUSCULOSKELETAL: Has the neck pain, arm and leg pain, some generalized body pains along with headache.  NEUROLOGIC: No focal numbness, weakness, seizure.  PSYCHIATRIC: No anxiety, depression.   ALLERGIES: CIPRO, CODEINE, GUAIFENESIN, PHENERGAN, PROVENTIL, SULFA, AND VALTREX.   HOME MEDICATIONS: Include:  1.  Atorvastatin 40 mg daily.  2.  Calcium with vitamin D 1 tablet twice a day. 3.  Claritin 10 mg daily.  4.  Docusate sodium 100 mg oral 2 times a day.  5.  Fish oil 1200 mg oral once a day.  6.  Fluticasone 2 sprays in each nostril daily.  7.  Potassium chloride 20 mEq oral once a day.  8.  Losartan 50 mg daily.  9.  Pradaxa 150 mg oral 2 times a day.  10.  Probiotic formula 1 capsule oral once a day.  11.  Ranitidine 150 mg oral 2 times a day.  12.  Singulair 10 mg daily.  13.  Symbicort 2 puffs inhaled 2 times a day.  14.  Synthroid 150 mcg oral once a day.  15.  Ventolin HFA 2 puffs inhaled 4 times a day.  16.  Vitamin D3, 1000 international units oral once a day.   PHYSICAL EXAMINATION: VITAL SIGNS: Temperature 98, pulse of 71, blood pressure 168/72, saturating 95% on room air. GENERAL: Elderly Caucasian female patient sitting up at the side of the bed, in distress secondary to her neck  pain.  PSYCHIATRIC: Alert and oriented x 3. Mood and affect appropriate. Judgment intact. Feels a little anxious.  HEENT: Atraumatic, normocephalic. Oral mucosa dry and pink. External ears and nose normal. No pallor. No icterus. Pupils bilaterally equal and reactive to light.  NECK: Stiff. No JVD, carotid bruit. No lymphadenopathy.  CARDIOVASCULAR: S1 and S2 without any murmurs. Peripheral pulses 2+. No edema.  RESPIRATORY: Normal work of breathing. Clear to auscultation on both sides.  GASTROINTESTINAL: Soft abdomen, nontender. Bowel sounds present. No visceromegaly palpable.  GENITOURINARY: No CVA tenderness or bladder distention.  SKIN: Warm and dry. No petechiae, rash, ulcers.  MUSCULOSKELETAL: Has tenderness along the neck muscles. No joint swelling, redness noticed. Normal muscle tone.  NEUROLOGICAL: Motor strength 5/5 in upper and lower extremities. Sensation to fine touch intact all over. Neck stiffness noticed but  Kernig sign negative.  LYMPHATIC: No cervical lymph adenopathy.   LABORATORY AND RADIOLOGICAL DATA: Show glucose of 106, BUN 15, creatinine 0.71, sodium 135, potassium 4, AST 12, ALT 18, bilirubin 1.5. WBC 22.5, hemoglobin 13.4, platelets 380 with neutrophils 81%, no bands. Urinalysis shows no bacteria.   Chest x-ray, PA and lateral, shows no acute abnormalities.   ASSESSMENT AND PLAN: 1.  Acute neck pain with leukocytosis: Initial concern was regarding possible meningitis, but the patient does not have any fever or photophobia. Kernig sign negative. The patient had similar presentation recently. I do not suspect meningitis in this patient. She did receive empiric antibiotics with ampicillin, vancomycin, and ceftriaxone in the Emergency Room. Discussed with Dr. Jasmine December of Emergency Room. Cannot get an LP at this time, as the patient has taken her Pradaxa earlier today morning. I suspect the patient likely has flulike symptoms with generalized body pains along with neck pain.  Will start her on pain medication and Skelaxin. She did have possible diskitis versus pannus on a recent CT scan of the cervical spine, which I will repeat at this time for followup. She does have leukocytosis, but is presently being worked up for chronic leukemia. She did respond to antibiotics, and her leukocytosis decreased during recent admission. Will see if she does the same with antibiotics. Will get blood cultures. Chest x-ray shows no pneumonia. No urinary tract infection. Will put her on vancomycin and Zosyn until we have culture results and the CT cervical spine results back.  2.  Paroxysmal atrial fibrillation: Continue rate control medications. Will hold Pradaxa at this time in case the patient needs an LP by tomorrow depending on her progress.  3.  Chronic obstructive pulmonary disease: Continue home inhalers.  4.  Myalgias: Check influenza test.  5.  Chronic leukocytosis: Possible chronic leukemia and is being seen at the [facility].  6.  Deep vein thrombosis prophylaxis: The patient is on Pradaxa.   CODE STATUS: Full code.   TIME SPENT: Today on this case was 50 minutes.   ____________________________ Leia Alf Addalee Kavanagh, MD srs:jcm D: 04/29/2013 16:51:15 ET T: 04/29/2013 19:22:55 ET JOB#: [id number]  cc: Alveta Heimlich R. Ismael Treptow, MD, <Dictator> Heidi M.  Hoy Morn, MD Sandeep R. Ma Hillock, MD Neita Carp MD ELECTRONICALLY SIGNED 05/02/2013 11:13

## 2014-10-07 ENCOUNTER — Encounter: Payer: Self-pay | Admitting: Emergency Medicine

## 2014-10-07 ENCOUNTER — Emergency Department
Admission: EM | Admit: 2014-10-07 | Discharge: 2014-10-07 | Disposition: A | Discharge status: Home / Self Care | Payer: Medicare Other | Attending: Emergency Medicine | Admitting: Emergency Medicine

## 2014-10-07 DIAGNOSIS — E039 Hypothyroidism, unspecified: Secondary | ICD-10-CM | POA: Diagnosis not present

## 2014-10-07 DIAGNOSIS — Z79899 Other long term (current) drug therapy: Secondary | ICD-10-CM | POA: Diagnosis not present

## 2014-10-07 DIAGNOSIS — Z7952 Long term (current) use of systemic steroids: Secondary | ICD-10-CM | POA: Insufficient documentation

## 2014-10-07 DIAGNOSIS — R531 Weakness: Secondary | ICD-10-CM

## 2014-10-07 DIAGNOSIS — Z7982 Long term (current) use of aspirin: Secondary | ICD-10-CM | POA: Diagnosis not present

## 2014-10-07 DIAGNOSIS — M6281 Muscle weakness (generalized): Secondary | ICD-10-CM | POA: Diagnosis not present

## 2014-10-07 DIAGNOSIS — Z7951 Long term (current) use of inhaled steroids: Secondary | ICD-10-CM | POA: Diagnosis not present

## 2014-10-07 HISTORY — DX: Presence of cardiac pacemaker: Z95.0

## 2014-10-07 HISTORY — DX: Unspecified asthma, uncomplicated: J45.909

## 2014-10-07 HISTORY — DX: Transient cerebral ischemic attack, unspecified: G45.9

## 2014-10-07 LAB — MAGNESIUM: Magnesium: 1.8 mg/dL (ref 1.7–2.4)

## 2014-10-07 LAB — CBC WITH DIFFERENTIAL/PLATELET
Basophils Absolute: 0.1 10*3/uL (ref 0–0.1)
Basophils Relative: 1 %
Eosinophils Absolute: 0.1 10*3/uL (ref 0–0.7)
Eosinophils Relative: 1 %
HCT: 35.5 % (ref 35.0–47.0)
Hemoglobin: 11.5 g/dL — ABNORMAL LOW (ref 12.0–16.0)
Lymphocytes Relative: 16 %
Lymphs Abs: 1.5 10*3/uL (ref 1.0–3.6)
MCH: 25.9 pg — ABNORMAL LOW (ref 26.0–34.0)
MCHC: 32.5 g/dL (ref 32.0–36.0)
MCV: 79.9 fL — ABNORMAL LOW (ref 80.0–100.0)
Monocytes Absolute: 1.6 10*3/uL — ABNORMAL HIGH (ref 0.2–0.9)
Monocytes Relative: 17 %
Neutro Abs: 6 10*3/uL (ref 1.4–6.5)
Neutrophils Relative %: 65 %
Platelets: 346 10*3/uL (ref 150–440)
RBC: 4.44 MIL/uL (ref 3.80–5.20)
RDW: 16.3 % — ABNORMAL HIGH (ref 11.5–14.5)
WBC: 9.2 10*3/uL (ref 3.6–11.0)

## 2014-10-07 LAB — BASIC METABOLIC PANEL
Anion gap: 9 (ref 5–15)
BUN: 9 mg/dL (ref 6–20)
CO2: 26 mmol/L (ref 22–32)
Calcium: 8.9 mg/dL (ref 8.9–10.3)
Chloride: 95 mmol/L — ABNORMAL LOW (ref 101–111)
Creatinine, Ser: 0.62 mg/dL (ref 0.44–1.00)
GFR calc Af Amer: >60 mL/min (ref >60)
GFR calc non Af Amer: >60 mL/min (ref >60)
Glucose, Bld: 107 mg/dL — ABNORMAL HIGH (ref 65–99)
Potassium: 3.7 mmol/L (ref 3.5–5.1)
Sodium: 130 mmol/L — ABNORMAL LOW (ref 135–145)

## 2014-10-07 LAB — CK: Total CK: 49 U/L (ref 38–234)

## 2014-10-07 LAB — TSH: TSH: 6.474 u[IU]/mL — ABNORMAL HIGH (ref 0.350–4.500)

## 2014-10-07 LAB — PHOSPHORUS: Phosphorus: 4.1 mg/dL (ref 2.5–4.6)

## 2014-10-07 MED ORDER — SODIUM CHLORIDE 0.9 % IV BOLUS (SEPSIS)
1000.0000 mL | Freq: Once | INTRAVENOUS | Status: AC
  Administered 2014-10-07: 1000 mL via INTRAVENOUS

## 2014-10-07 NOTE — ED Notes (Signed)
Says was at rehab yesterday for recent hip surgery.  Says she was told by therepist that she needed to be checked to see hwy her legs are weak. She went to [facility] this am ([facility]) and was sent here to find out why her legs are weak for 3-4 days.

## 2014-10-07 NOTE — Discharge Instructions (Signed)

## 2014-10-07 NOTE — ED Notes (Signed)
Fluids infusing. Patient resting comfortably with family at bedside. Is aware of need for urine specimen. Will notify me when ready.

## 2014-10-07 NOTE — ED Notes (Signed)
Patient had right hip replaced in April. Has been getting weaker. Was told by PT to see her PCP or come to ER due to weakness. Saw [facility]  today and was told to come here.

## 2014-10-07 NOTE — ED Provider Notes (Signed)
[facility] Emergency Department Provider Note  ____________________________________________  Time seen: 12:30 PM  I have reviewed the triage vital signs and the nursing notes.   HISTORY  Chief Complaint Extremity Weakness    HPI [name] is a 81 y.o. female has been having very gradual onset persistent bilateral lower extremity fatigue and weakness over the past week.  The patient is staying at a rehabilitation facility due to a right hip total hip replacement in April. Physical therapy there noted that she seemed to be getting weaker and recommended she see her primary care doctor. She saw primary care this morning, who checked labs and sent her to the ED for further evaluation of her weakness. Patient denies any trauma or new injuries. No back pain chest pain shortness of breath abdominal pain nausea vomiting diarrhea. She reports eating and drinking normally. But does feel her mouth is low but dry. The patient reports that she has had similar leg weakness as well as some pedal edema bilaterally, which in the past was due to hypothyroidism. 2 months ago her TSH was very high after surgery. Her doctor checked it again one month ago and noted it to be even higher and therefore increased her Synthroid dose. 5 days ago was checked again and noted to be improving but still elevated at 4.4. She has a plan to continue following up on this with her doctor.     Past Medical History  Diagnosis Date  . Pseudogout of ankle or foot   . Atrial fibrillation   . Pacemaker   . Asthma   . TIA (transient ischemic attack)     Patient Active Problem List   Diagnosis Date Noted  . Temporary cerebral vascular dysfunction 02/28/2014  . DD (diverticular disease) 02/28/2014  . Allergic rhinitis 02/28/2014  . Allergy status to sulfonamides 02/28/2014  . History of anticoagulant therapy 02/28/2014  . Arthritis 02/28/2014  . Airway hyperreactivity 02/28/2014  . A-fib  02/28/2014  . Bilateral cataracts 02/28/2014  . Dermatitis, eczematoid 02/28/2014  . Allergy to environmental factors 02/28/2014  . Acid reflux 02/28/2014  . Polypharmacy 02/28/2014  . HLD (hyperlipidemia) 02/28/2014  . Adult hypothyroidism 02/28/2014  . BP (high blood pressure) 02/28/2014  . Arthritis, degenerative 02/28/2014  . Degenerative arthritis of hip 10/09/2013  . 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 Neck rigid 05/30/2013  . 1St degree AV block 10/10/2012  . History of biliary T-tube placement 06/14/2012  . History of cardioversion 06/05/2012  . Avascular necrosis of femoral head 06/01/2012  . Lumbar radiculopathy 06/01/2012  . DDD (degenerative disc disease), lumbar 06/01/2012  . H/O cardiac catheterization 10/20/2009    Past Surgical History  Procedure Laterality Date  . Foot sugery    . Pacemaker insertion    . Eye surgery    . Ablation    . Hip arthroplasty      right    Current Outpatient Rx  Name  Route  Sig  Dispense  Refill  . acetaminophen (TYLENOL) 500 MG tablet   Oral   Take 1,000 mg by mouth 2 (two) times daily as needed for mild pain or headache.          Marland Kitchen aspirin EC 81 MG tablet   Oral   Take 81 mg by mouth daily.         Marland Kitchen atorvastatin (LIPITOR) 40 MG tablet   Oral   Take 40 mg by mouth daily.          Marland Kitchen  budesonide-formoterol (SYMBICORT) 160-4.5 MCG/ACT inhaler   Inhalation   Inhale 2 puffs into the lungs 2 (two) times daily.          . cholecalciferol (VITAMIN D) 1000 UNITS tablet   Oral   Take 2,000 Units by mouth daily.         . clobetasol cream (TEMOVATE) 0.05 %   Topical   Apply 1 application topically 2 (two) times daily.          . colchicine 0.6 MG tablet   Oral   Take 0.6 mg by mouth daily.          Marland Kitchen desonide (DESOWEN) 0.05 % ointment   Topical   Apply 1 application topically 2 (two) times daily.         . diclofenac sodium (VOLTAREN) 1 % GEL      Apply 2 grams bid prn          . fexofenadine (ALLEGRA) 180 MG tablet   Oral   Take 180 mg by mouth daily.          . fluticasone (FLONASE) 50 MCG/ACT nasal spray      USE 2 SPRAYS IN EACH NOSTRIL DAILY         . ipratropium (ATROVENT) 0.06 % nasal spray   Each Nare   Place 2 sprays into both nostrils 3 (three) times daily.          Marland Kitchen levothyroxine (SYNTHROID, LEVOTHROID) 112 MCG tablet   Oral   Take 112 mcg by mouth daily before breakfast.         . lidocaine (LIDODERM) 5 %   Transdermal   Place 1 patch onto the skin every 12 (twelve) hours as needed.          . montelukast (SINGULAIR) 10 MG tablet   Oral   Take 10 mg by mouth at bedtime. TAKE 1 TABLET DAILY         . omega-3 acid ethyl esters (LOVAZA) 1 G capsule   Oral   Take 2 g by mouth daily.         Marland Kitchen omeprazole (PRILOSEC) 20 MG capsule   Oral   Take 20 mg by mouth daily.          . potassium chloride SA (KLOR-CON M20) 20 MEQ tablet   Oral   Take 20 mEq by mouth daily.          . predniSONE (DELTASONE) 10 MG tablet      6 po daily for 2 days, then 4 po daily for 2 days, then 2 po daily for 2 days, then 1 po daily for 2 days.         . ranitidine (ZANTAC) 150 MG tablet   Oral   Take 150 mg by mouth 2 (two) times daily.          . tapentadol (NUCYNTA) 50 MG TABS tablet   Oral   Take 25-50 mg by mouth 3 (three) times daily as needed. 1/2-1 po tid prn         . albuterol (VENTOLIN HFA) 108 (90 BASE) MCG/ACT inhaler   Inhalation   Inhale 2 puffs into the lungs every 4 (four) hours as needed for wheezing.          Marland Kitchen apixaban (ELIQUIS) 5 MG TABS tablet   Oral   Take by mouth.         . Cholecalciferol (VITAMIN D-1000 MAX ST) 1000 UNITS tablet   Oral  Take by mouth.         . Desonide POWD      Use. Apply as needed         . levothyroxine (SYNTHROID, LEVOTHROID) 100 MCG tablet      Take on an empty stomach with a glass of water at least 30-60 minutes before breakfast.         . Omega-3  1000 MG CAPS   Oral   Take by mouth.           Allergies Fluticasone-salmeterol; Albuterol; Ciprofloxacin; Codeine; Promethazine; Guaifenesin; and Sulfa antibiotics  No family history on file.  Social History History  Substance Use Topics  . Smoking status: Never Smoker   . Smokeless tobacco: Never Used  . Alcohol Use: 0.0 oz/week    0 Standard drinks or equivalent per week    Review of Systems  Constitutional: No fever or chills. No weight changes. Low energy level Eyes:No blurry vision or double vision.  ENT: No sore throat. Cardiovascular: No chest pain. Respiratory: No dyspnea or cough. Gastrointestinal: Negative for abdominal pain, vomiting and diarrhea.  No BRBPR or melena. Genitourinary: Negative for dysuria, urinary retention, bloody urine, or difficulty urinating. Musculoskeletal: Negative for back pain. Generalized weakness of bilateral lower extremities with increased swelling at bilateral feet and ankles.. Skin: Negative for rash. Neurological: Negative for headaches, focal weakness or numbness. Psychiatric:No anxiety or depression.   Endocrine:No hot/cold intolerance, changes in energy, or sleep difficulty.  10-point ROS otherwise negative.  ____________________________________________   PHYSICAL EXAM:  VITAL SIGNS: ED Triage Vitals  Enc Vitals Group     BP 10/07/14 1029 151/73 mmHg     Pulse Rate 10/07/14 1029 70     Resp 10/07/14 1029 16     Temp --      Temp src --      SpO2 10/07/14 1029 100 %     Weight 10/07/14 1029 140 lb (63.504 kg)     Height 10/07/14 1029 5\' 4"  (1.626 m)     Head Cir --      Peak Flow --      Pain Score 10/07/14 1034 3     Pain Loc --      Pain Edu? --      Excl. in [place]? --      Constitutional: Alert and oriented. Well appearing and in no distress. Eyes: No scleral icterus. No conjunctival pallor. PERRL. EOMI ENT   Head: Normocephalic and atraumatic.   Nose: No congestion/rhinnorhea. No septal  hematoma   Mouth/Throat: Dry mucous membranes, no pharyngeal erythema. No peritonsillar mass. No uvula shift.   Neck: No stridor. No SubQ emphysema. No meningismus. Hematological/Lymphatic/Immunilogical: No cervical lymphadenopathy. Cardiovascular: RRR. Normal and symmetric distal pulses are present in all extremities. No murmurs, rubs, or gallops. Respiratory: Normal respiratory effort without tachypnea nor retractions. Breath sounds are clear and equal bilaterally. No wheezes/rales/rhonchi. Gastrointestinal: Soft and nontender. No distention. There is no CVA tenderness.  No rebound, rigidity, or guarding. Genitourinary: deferred Musculoskeletal: Nontender with normal range of motion in all extremities. No joint effusions.  No lower extremity tenderness.  1+ edema of bilateral feet and ankles. No midline spinal tenderness Neurologic:   Normal speech and language.  CN 2-10 normal. Motor grossly intact. No pronator drift.  Normal gait. No gross focal neurologic deficits are appreciated.  Skin:  Skin is warm, dry and intact. No rash noted.  No petechiae, purpura, or bullae. Psychiatric: Mood and affect are normal. Speech and behavior  are normal. Patient exhibits appropriate insight and judgment.  ____________________________________________    LABS (pertinent positives/negatives) (all labs ordered are listed, but only abnormal results are displayed) Labs Reviewed  BASIC METABOLIC PANEL - Abnormal; Notable for the following:    Sodium 130 (*)    Chloride 95 (*)    Glucose, Bld 107 (*)    All other components within normal limits  CBC WITH DIFFERENTIAL/PLATELET - Abnormal; Notable for the following:    Hemoglobin 11.5 (*)    MCV 79.9 (*)    MCH 25.9 (*)    RDW 16.3 (*)    Monocytes Absolute 1.6 (*)    All other components within normal limits  TSH - Abnormal; Notable for the following:    TSH 6.474 (*)    All other components within normal limits  CK  MAGNESIUM  PHOSPHORUS    urinalysis from primary care earlier today reviewed in care everywhere which was unremarkable.  TSH increased from 4.4-6.5. ____________________________________________   EKG    ____________________________________________    RADIOLOGY    ____________________________________________   PROCEDURES  ____________________________________________   INITIAL IMPRESSION / ASSESSMENT AND PLAN / ED COURSE  Pertinent labs & imaging results that were available during my care of the patient were reviewed by me and considered in my medical decision making (see chart for details).  Patient presents with generalized weakness which is most consistent with her hypothyroidism which seems to be worsening recently. No evidence of stroke, spinal cord dysfunction such as epidural abscess or hematoma. Low suspicion of ACS PE pneumothorax AAA dissection or sepsis.  ----------------------------------------- 3:05 PM on 10/07/2014 -----------------------------------------  Discussed with primary care vickie Fowler regarding the increase in TSH and likely worsening hypothyroidism. The patient has a follow-up visit in 2 days with her regular PCP Dr. Hoy Morn, so Dr. Vickki Muff advised not changing any medications including her Synthroid dose and told patient follows up in clinic in 2 days. The patient is feeling better after IV fluids, and was able to ambulate to the bathroom with assistance. She uses a cane and walker at baseline and continues to do this. Patient feels better and is comfortable with going home. ____________________________________________   FINAL CLINICAL IMPRESSION(S) / ED DIAGNOSES  Final diagnoses:  Generalized weakness  Hypothyroidism, unspecified hypothyroidism type      Carrie Mew, MD 10/07/14 1506

## 2014-12-21 ENCOUNTER — Encounter: Payer: Self-pay | Admitting: Gynecology

## 2014-12-21 ENCOUNTER — Ambulatory Visit
Admission: EM | Admit: 2014-12-21 | Discharge: 2014-12-21 | Disposition: A | Discharge status: Home / Self Care | Payer: Medicare Other | Attending: Family Medicine | Admitting: Family Medicine

## 2014-12-21 DIAGNOSIS — M1180 Other specified crystal arthropathies, unspecified site: Secondary | ICD-10-CM

## 2014-12-21 DIAGNOSIS — M1120 Other chondrocalcinosis, unspecified site: Secondary | ICD-10-CM

## 2014-12-21 MED ORDER — PREDNISONE 10 MG PO TABS: ORAL_TABLET | ORAL | Status: DC

## 2014-12-21 NOTE — ED Notes (Signed)
Patient c/o Pseudogout flared up x 3 days ago.

## 2015-01-06 ENCOUNTER — Encounter: Payer: Self-pay | Admitting: Emergency Medicine

## 2015-01-06 ENCOUNTER — Ambulatory Visit
Admission: EM | Admit: 2015-01-06 | Discharge: 2015-01-06 | Disposition: A | Discharge status: Home / Self Care | Payer: Medicare Other | Attending: Family Medicine | Admitting: Family Medicine

## 2015-01-06 DIAGNOSIS — L259 Unspecified contact dermatitis, unspecified cause: Secondary | ICD-10-CM | POA: Diagnosis not present

## 2015-01-06 DIAGNOSIS — L03012 Cellulitis of left finger: Secondary | ICD-10-CM

## 2015-01-06 MED ORDER — PREDNISONE 10 MG (21) PO TBPK: ORAL_TABLET | ORAL | Status: DC

## 2015-01-06 MED ORDER — MUPIROCIN 2 % EX OINT: 1.0000 "application " | TOPICAL_OINTMENT | Freq: Three times a day (TID) | CUTANEOUS | Status: DC

## 2015-01-06 NOTE — Discharge Instructions (Signed)
Cellulitis Cellulitis is an infection of the skin and the tissue under the skin. The infected area is usually red and tender. This happens most often in the arms and lower legs. HOME CARE   Take your antibiotic medicine as told. Finish the medicine even if you start to feel better.  Keep the infected arm or leg raised (elevated).  Put a warm cloth on the area up to 4 times per day.  Only take medicines as told by your doctor.  Keep all doctor visits as told. GET HELP IF:  You see red streaks on the skin coming from the infected area.  Your red area gets bigger or turns a dark color.  Your bone or joint under the infected area is painful after the skin heals.  Your infection comes back in the same area or different area.  You have a puffy (swollen) bump in the infected area.  You have new symptoms.  You have a fever. GET HELP RIGHT AWAY IF:   You feel very sleepy.  You throw up (vomit) or have watery poop (diarrhea).  You feel sick and have muscle aches and pains. MAKE SURE YOU:   Understand these instructions.  Will watch your condition.  Will get help right away if you are not doing well or get worse. Document Released: 09/28/2007 Document Revised: 08/26/2013 Document Reviewed: 06/27/2011 [facility] Patient Information 2015 [place], [place]. This information is not intended to replace advice given to you by your health care provider. Make sure you discuss any questions you have with your health care provider.  Contact Dermatitis Contact dermatitis is a rash that happens when something touches the skin. You touched something that irritates your skin, or you have allergies to something you touched. HOME CARE   Avoid the thing that caused your rash.  Keep your rash away from hot water, soap, sunlight, chemicals, and other things that might bother it.  Do not scratch your rash.  You can take cool baths to help stop itching.  Only take medicine as told by your  doctor.  Keep all doctor visits as told. GET HELP RIGHT AWAY IF:   Your rash is not better after 3 days.  Your rash gets worse.  Your rash is puffy (swollen), tender, red, sore, or warm.  You have problems with your medicine. MAKE SURE YOU:   Understand these instructions.  Will watch your condition.  Will get help right away if you are not doing well or get worse. Document Released: 02/06/2009 Document Revised: 07/04/2011 Document Reviewed: 09/14/2010 [facility] Patient Information 2015 [place], [place]. This information is not intended to replace advice given to you by your health care provider. Make sure you discuss any questions you have with your health care provider.

## 2015-01-06 NOTE — ED Notes (Signed)
Pt with a rash on left [id number]

## 2015-01-06 NOTE — ED Provider Notes (Signed)
CSN: [id number]     Arrival date & time 01/06/15  1313 History   First MD Initiated Contact with Patient 01/06/15 1347     Chief Complaint  Patient presents with  . Rash   (Consider location/radiation/quality/duration/timing/severity/associated sxs/prior Treatment) Patient is a 81 y.o. female presenting with rash. The history is provided by the patient and a friend. No language interpreter was used.  Rash Location:  Torso, head/neck and shoulder/arm Head/neck rash location:  Head Shoulder/arm rash location:  L forearm, R forearm, L hand, R hand, R wrist and L wrist Torso rash location:  L chest and R chest Quality: blistering   Quality comment:  Blistering  4th finger on the L hand Severity:  Moderate Onset quality:  Sudden Duration:  3 days Chronicity:  New Context: exposure to similar rash and plant contact   Relieved by:  Topical steroids Ineffective treatments:  None tried   Past Medical History  Diagnosis Date  . Pseudogout of ankle or foot   . Atrial fibrillation   . Pacemaker   . Asthma   . TIA (transient ischemic attack)    Past Surgical History  Procedure Laterality Date  . Foot sugery    . Pacemaker insertion    . Eye surgery    . Ablation    . Hip arthroplasty      right   History reviewed. No pertinent family history. Social History  Substance Use Topics  . Smoking status: Never Smoker   . Smokeless tobacco: Never Used  . Alcohol Use: 0.0 oz/week    0 Standard drinks or equivalent per week   OB History    No data available     Review of Systems  HENT: Positive for facial swelling.   Skin: Positive for color change and rash.  All other systems reviewed and are negative.   Allergies  Fluticasone-salmeterol; Albuterol; Ciprofloxacin; Codeine; Promethazine; Guaifenesin; and Sulfa antibiotics  Home Medications   Prior to Admission medications   Medication Sig Start Date End Date Taking? Authorizing Provider  acetaminophen (TYLENOL) 500 MG  tablet Take 1,000 mg by mouth 2 (two) times daily as needed for mild pain or headache.     Historical Provider, MD  albuterol (VENTOLIN HFA) 108 (90 BASE) MCG/ACT inhaler Inhale 2 puffs into the lungs every 4 (four) hours as needed for wheezing.  01/20/14 01/21/15  Historical Provider, MD  apixaban (ELIQUIS) 5 MG TABS tablet Take by mouth. 06/26/13   Historical Provider, MD  aspirin EC 81 MG tablet Take 81 mg by mouth daily.    Historical Provider, MD  atorvastatin (LIPITOR) 40 MG tablet Take 40 mg by mouth daily.  11/14/13   Historical Provider, MD  budesonide-formoterol (SYMBICORT) 160-4.5 MCG/ACT inhaler Inhale 2 puffs into the lungs 2 (two) times daily.  09/09/13   Historical Provider, MD  cholecalciferol (VITAMIN D) 1000 UNITS tablet Take 2,000 Units by mouth daily.    Historical Provider, MD  Cholecalciferol (VITAMIN D-1000 MAX ST) 1000 UNITS tablet Take by mouth.    Historical Provider, MD  clobetasol cream (TEMOVATE) 5.62 % Apply 1 application topically 2 (two) times daily.     Historical Provider, MD  colchicine 0.6 MG tablet Take 0.6 mg by mouth daily.  01/20/14   Historical Provider, MD  desonide (DESOWEN) 0.05 % ointment Apply 1 application topically 2 (two) times daily.    Historical Provider, MD  Desonide POWD Use. Apply as needed    Historical Provider, MD  diclofenac sodium (VOLTAREN) 1 %  GEL Apply 2 grams bid prn 08/22/13   Historical Provider, MD  fexofenadine (ALLEGRA) 180 MG tablet Take 180 mg by mouth daily.  01/02/14 12/21/14  Historical Provider, MD  fluticasone (FLONASE) 50 MCG/ACT nasal spray USE 2 SPRAYS IN EACH NOSTRIL DAILY 12/27/13   Historical Provider, MD  ipratropium (ATROVENT) 0.06 % nasal spray Place 2 sprays into both nostrils 3 (three) times daily.  09/25/13   Historical Provider, MD  levothyroxine (SYNTHROID, LEVOTHROID) 100 MCG tablet Take on an empty stomach with a glass of water at least 30-60 minutes before breakfast. 12/16/13   Historical Provider, MD  levothyroxine  (SYNTHROID, LEVOTHROID) 112 MCG tablet Take 112 mcg by mouth daily before breakfast.    Historical Provider, MD  lidocaine (LIDODERM) 5 % Place 1 patch onto the skin every 12 (twelve) hours as needed.     Historical Provider, MD  montelukast (SINGULAIR) 10 MG tablet Take 10 mg by mouth at bedtime. TAKE 1 TABLET DAILY 12/27/13   Historical Provider, MD  mupirocin ointment (BACTROBAN) 2 % Apply 1 application topically 3 (three) times daily. 01/06/15   Frederich Cha, MD  Omega-3 1000 MG CAPS Take by mouth.    Historical Provider, MD  omega-3 acid ethyl esters (LOVAZA) 1 G capsule Take 2 g by mouth daily.    Historical Provider, MD  omeprazole (PRILOSEC) 20 MG capsule Take 20 mg by mouth daily.     Historical Provider, MD  potassium chloride SA (KLOR-CON M20) 20 MEQ tablet Take 20 mEq by mouth daily.  01/20/14 01/21/15  Historical Provider, MD  predniSONE (DELTASONE) 10 MG tablet 6 tabs po qd for 2 days, then 4 tabs po qd for 3 days, then 2 tabs po qd for 3 days, then 1 tab po qd for 3 days 12/21/14   Norval Gable, MD  predniSONE (STERAPRED UNI-PAK 21 TAB) 10 MG (21) TBPK tablet 6 tabs day 1 and 2, 5 tabs day 3 and 4, 4 tabs day 5 and 6, 3 tabs day 7 and 8, 2 tabs day 9 and 10, 1 tab day 11 and 12. Take orally 01/06/15   Frederich Cha, MD  ranitidine (ZANTAC) 150 MG tablet Take 150 mg by mouth 2 (two) times daily.  07/19/13   Historical Provider, MD  tapentadol (NUCYNTA) 50 MG TABS tablet Take 25-50 mg by mouth 3 (three) times daily as needed. 1/2-1 po tid prn 11/05/13   Historical Provider, MD   Meds Ordered and Administered this Visit  Medications - No data to display  BP 164/94 mmHg  Pulse 80  Temp(Src) 98.5 F (36.9 C) (Tympanic)  Resp 20  Ht 5\' 4"  (1.626 m)  Wt 145 lb (65.772 kg)  BMI 24.88 kg/m2  SpO2 100% No data found.   Physical Exam  Constitutional: She is oriented to person, place, and time. She appears well-developed and well-nourished.  HENT:  Head: Normocephalic.  Eyes: Pupils are  equal, round, and reactive to light.  Neck: Neck supple.  Musculoskeletal: Normal range of motion.  Neurological: She is alert and oriented to person, place, and time. No cranial nerve deficit.  Skin: Rash noted. There is erythema.     Blister on the 4th finger started yesterday  Psychiatric: She has a normal mood and affect. Her behavior is normal.  Vitals reviewed.   ED Course  Procedures (including critical care time)  Labs Review Labs Reviewed - No data to display  Imaging Review No results found.   Visual Acuity Review  Right Eye  Distance:   Left Eye Distance:   Bilateral Distance:    Right Eye Near:   Left Eye Near:    Bilateral Near:         MDM   1. Contact dermatitis   2. Cellulitis of finger of left hand     Offered Solumedrol she declined. Will place her on 12 day course of prednisone.    Frederich Cha, MD 01/06/15 [phone]

## 2015-01-20 NOTE — ED Provider Notes (Signed)
CSN: [id number]     Arrival date & time 12/21/14  0810 History   First MD Initiated Contact with Patient 12/21/14 [phone]     Chief Complaint  Patient presents with  . Gout   (Consider location/radiation/quality/duration/timing/severity/associated sxs/prior Treatment) HPI Comments: 81 yo female with a h/o pseudogout presents with 3 days h/o flare up with swelling and pain to her feet/toes. Denies any fevers, chills. States has had similar episodes in the past, treated/resolved with prednisone.   The history is provided by the patient.    Past Medical History  Diagnosis Date  . Pseudogout of ankle or foot   . Atrial fibrillation   . Pacemaker   . Asthma   . TIA (transient ischemic attack)    Past Surgical History  Procedure Laterality Date  . Foot sugery    . Pacemaker insertion    . Eye surgery    . Ablation    . Hip arthroplasty      right   No family history on file. Social History  Substance Use Topics  . Smoking status: Never Smoker   . Smokeless tobacco: Never Used  . Alcohol Use: 0.0 oz/week    0 Standard drinks or equivalent per week   OB History    No data available     Review of Systems  Allergies  Fluticasone-salmeterol; Albuterol; Ciprofloxacin; Codeine; Promethazine; Guaifenesin; and Sulfa antibiotics  Home Medications   Prior to Admission medications   Medication Sig Start Date End Date Taking? Authorizing Provider  acetaminophen (TYLENOL) 500 MG tablet Take 1,000 mg by mouth 2 (two) times daily as needed for mild pain or headache.    Yes Historical Provider, MD  albuterol (VENTOLIN HFA) 108 (90 BASE) MCG/ACT inhaler Inhale 2 puffs into the lungs every 4 (four) hours as needed for wheezing.  01/20/14 01/21/15 Yes Historical Provider, MD  apixaban (ELIQUIS) 5 MG TABS tablet Take by mouth. 06/26/13  Yes Historical Provider, MD  aspirin EC 81 MG tablet Take 81 mg by mouth daily.   Yes Historical Provider, MD  atorvastatin (LIPITOR) 40 MG tablet Take 40 mg by  mouth daily.  11/14/13  Yes Historical Provider, MD  budesonide-formoterol (SYMBICORT) 160-4.5 MCG/ACT inhaler Inhale 2 puffs into the lungs 2 (two) times daily.  09/09/13  Yes Historical Provider, MD  cholecalciferol (VITAMIN D) 1000 UNITS tablet Take 2,000 Units by mouth daily.   Yes Historical Provider, MD  Cholecalciferol (VITAMIN D-1000 MAX ST) 1000 UNITS tablet Take by mouth.   Yes Historical Provider, MD  clobetasol cream (TEMOVATE) 1.01 % Apply 1 application topically 2 (two) times daily.    Yes Historical Provider, MD  colchicine 0.6 MG tablet Take 0.6 mg by mouth daily.  01/20/14  Yes Historical Provider, MD  desonide (DESOWEN) 0.05 % ointment Apply 1 application topically 2 (two) times daily.   Yes Historical Provider, MD  Desonide POWD Use. Apply as needed   Yes Historical Provider, MD  diclofenac sodium (VOLTAREN) 1 % GEL Apply 2 grams bid prn 08/22/13  Yes Historical Provider, MD  fexofenadine (ALLEGRA) 180 MG tablet Take 180 mg by mouth daily.  01/02/14 12/21/14 Yes Historical Provider, MD  fluticasone (FLONASE) 50 MCG/ACT nasal spray USE 2 SPRAYS IN EACH NOSTRIL DAILY 12/27/13  Yes Historical Provider, MD  ipratropium (ATROVENT) 0.06 % nasal spray Place 2 sprays into both nostrils 3 (three) times daily.  09/25/13  Yes Historical Provider, MD  levothyroxine (SYNTHROID, LEVOTHROID) 100 MCG tablet Take on an empty stomach with a  glass of water at least 30-60 minutes before breakfast. 12/16/13  Yes Historical Provider, MD  levothyroxine (SYNTHROID, LEVOTHROID) 112 MCG tablet Take 112 mcg by mouth daily before breakfast.   Yes Historical Provider, MD  lidocaine (LIDODERM) 5 % Place 1 patch onto the skin every 12 (twelve) hours as needed.    Yes Historical Provider, MD  montelukast (SINGULAIR) 10 MG tablet Take 10 mg by mouth at bedtime. TAKE 1 TABLET DAILY 12/27/13  Yes Historical Provider, MD  Omega-3 1000 MG CAPS Take by mouth.   Yes Historical Provider, MD  omega-3 acid ethyl esters (LOVAZA) 1 G  capsule Take 2 g by mouth daily.   Yes Historical Provider, MD  omeprazole (PRILOSEC) 20 MG capsule Take 20 mg by mouth daily.    Yes Historical Provider, MD  potassium chloride SA (KLOR-CON M20) 20 MEQ tablet Take 20 mEq by mouth daily.  01/20/14 01/21/15 Yes Historical Provider, MD  ranitidine (ZANTAC) 150 MG tablet Take 150 mg by mouth 2 (two) times daily.  07/19/13  Yes Historical Provider, MD  tapentadol (NUCYNTA) 50 MG TABS tablet Take 25-50 mg by mouth 3 (three) times daily as needed. 1/2-1 po tid prn 11/05/13  Yes Historical Provider, MD  mupirocin ointment (BACTROBAN) 2 % Apply 1 application topically 3 (three) times daily. 01/06/15   Frederich Cha, MD  predniSONE (DELTASONE) 10 MG tablet 6 tabs po qd for 2 days, then 4 tabs po qd for 3 days, then 2 tabs po qd for 3 days, then 1 tab po qd for 3 days 12/21/14   Norval Gable, MD  predniSONE (STERAPRED UNI-PAK 21 TAB) 10 MG (21) TBPK tablet 6 tabs day 1 and 2, 5 tabs day 3 and 4, 4 tabs day 5 and 6, 3 tabs day 7 and 8, 2 tabs day 9 and 10, 1 tab day 11 and 12. Take orally 01/06/15   Frederich Cha, MD   Meds Ordered and Administered this Visit  Medications - No data to display  BP 129/73 mmHg  Pulse 73  Temp(Src) 98.4 F (36.9 C) (Tympanic)  Resp 16  Ht 5\' 4"  (1.626 m)  Wt 147 lb (66.679 kg)  BMI 25.22 kg/m2  SpO2 92% No data found.   Physical Exam  Constitutional: She appears well-developed and well-nourished. No distress.  Musculoskeletal:       Right foot: There is deformity.       Left foot: There is deformity.  Skin: She is not diaphoretic. There is erythema.  Nursing note and vitals reviewed.   ED Course  Procedures (including critical care time)  Labs Review Labs Reviewed - No data to display  Imaging Review No results found.   Visual Acuity Review  Right Eye Distance:   Left Eye Distance:   Bilateral Distance:    Right Eye Near:   Left Eye Near:    Bilateral Near:         MDM   1. Pseudogout     Discharge Medication List as of 12/21/2014  9:25 AM     Plan: 1. diagnosis reviewed with patient 2. rx as per orders; risks, benefits, potential side effects reviewed with patient; rx given for prednisone 3. Recommend supportive treatment with otc analgesics prn  4. F/u prn if symptoms worsen or don't improve   Norval Gable, MD 01/20/15 1244

## 2015-03-24 ENCOUNTER — Other Ambulatory Visit: Payer: Self-pay | Admitting: Family Medicine

## 2015-03-24 DIAGNOSIS — Z1231 Encounter for screening mammogram for malignant neoplasm of breast: Secondary | ICD-10-CM

## 2015-03-26 ENCOUNTER — Ambulatory Visit
Admission: RE | Admit: 2015-03-26 | Discharge: 2015-03-26 | Disposition: A | Discharge status: Home / Self Care | Payer: Medicare Other | Source: Ambulatory Visit | Attending: Family Medicine | Admitting: Family Medicine

## 2015-03-26 DIAGNOSIS — Z1231 Encounter for screening mammogram for malignant neoplasm of breast: Secondary | ICD-10-CM | POA: Diagnosis not present

## 2015-09-08 ENCOUNTER — Other Ambulatory Visit: Payer: Self-pay | Admitting: Family Medicine

## 2015-09-08 DIAGNOSIS — Z780 Asymptomatic menopausal state: Secondary | ICD-10-CM

## 2015-09-29 ENCOUNTER — Ambulatory Visit
Admission: RE | Admit: 2015-09-29 | Discharge: 2015-09-29 | Disposition: A | Discharge status: Home / Self Care | Payer: Medicare Other | Source: Ambulatory Visit | Attending: Family Medicine | Admitting: Family Medicine

## 2015-09-29 DIAGNOSIS — M85852 Other specified disorders of bone density and structure, left thigh: Secondary | ICD-10-CM | POA: Diagnosis not present

## 2015-09-29 DIAGNOSIS — Z780 Asymptomatic menopausal state: Secondary | ICD-10-CM | POA: Insufficient documentation

## 2016-01-19 ENCOUNTER — Ambulatory Visit
Admission: EM | Admit: 2016-01-19 | Discharge: 2016-01-19 | Disposition: A | Discharge status: Home / Self Care | Payer: Medicare Other | Attending: Family Medicine | Admitting: Family Medicine

## 2016-01-19 DIAGNOSIS — S61411A Laceration without foreign body of right hand, initial encounter: Secondary | ICD-10-CM | POA: Diagnosis not present

## 2016-01-19 DIAGNOSIS — L089 Local infection of the skin and subcutaneous tissue, unspecified: Secondary | ICD-10-CM | POA: Diagnosis not present

## 2016-01-19 MED ORDER — CEPHALEXIN 500 MG PO CAPS: 500.0000 mg | ORAL_CAPSULE | Freq: Three times a day (TID) | ORAL | 0 refills | Status: AC

## 2016-01-19 MED ORDER — MUPIROCIN 2 % EX OINT: TOPICAL_OINTMENT | CUTANEOUS | 0 refills | Status: DC

## 2016-01-19 MED ORDER — TETANUS-DIPHTHERIA TOXOIDS TD 5-2 LFU IM INJ
0.5000 mL | INJECTION | Freq: Once | INTRAMUSCULAR | Status: AC
  Administered 2016-01-19: 0.5 mL via INTRAMUSCULAR

## 2016-01-19 NOTE — ED Provider Notes (Signed)
[facility] ____________________________________________  Time seen: Approximately 10:00 PM  I have reviewed the triage vital signs and the nursing notes.   HISTORY  Chief Complaint Laceration   HPI [name] is a 82 y.o. female presents with sister at bedside for the evaluation of right thumb laceration. Patient reports that 9 days ago she was opening a can of dog food and accidentally cut her right thumb on the edge of the can. Patient reports she is seeking evaluation today to make sure the laceration did not need to be repaired as well as there is some redness around the wound. Patient reports has been cleaning with peroxide at home as well as applying topical Neosporin.  Denies any pain. Patient reports full range of motion. Denies any drainage. Denies any decreased range of motion. Denies any numbness or tingling sensation. Denies any other cut or injury. Reports unsure of last tetanus immunization. Reports right hand dominant. Denies any renal insufficiency.  Hortencia Pilar, MD: PCP   Past Medical History:  Diagnosis Date  . Asthma   . Atrial fibrillation ([place])   . Pacemaker   . Pseudogout of ankle or foot   . TIA (transient ischemic attack)     Patient Active Problem List   Diagnosis Date Noted  . Temporary cerebral vascular dysfunction 02/28/2014  . DD (diverticular disease) 02/28/2014  . Allergic rhinitis 02/28/2014  . Allergy status to sulfonamides 02/28/2014  . History of anticoagulant therapy 02/28/2014  . Arthritis 02/28/2014  . Airway hyperreactivity 02/28/2014  . A-fib ([place]) 02/28/2014  . Bilateral cataracts 02/28/2014  . Dermatitis, eczematoid 02/28/2014  . Allergy to environmental factors 02/28/2014  . Acid reflux 02/28/2014  . Polypharmacy 02/28/2014  . HLD (hyperlipidemia) 02/28/2014  . Adult hypothyroidism 02/28/2014  . BP (high blood pressure) 02/28/2014  . Arthritis, degenerative 02/28/2014  . Degenerative arthritis of  hip 10/09/2013  . 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 Neck rigid 05/30/2013  . 1St degree AV block 10/10/2012  . History of biliary T-tube placement 06/14/2012  . History of cardioversion 06/05/2012  . Avascular necrosis of femoral head ([place]) 06/01/2012  . Lumbar radiculopathy 06/01/2012  . DDD (degenerative disc disease), lumbar 06/01/2012  . H/O cardiac catheterization 10/20/2009    Past Surgical History:  Procedure Laterality Date  . ABLATION    . EYE SURGERY    . foot sugery    . HIP ARTHROPLASTY     right  . PACEMAKER INSERTION      Current Outpatient Rx  . Order #: [id number] Class: Historical Med  . Order #: [id number] Class: Historical Med  . Order #: [id number] Class: Historical Med  . Order #: [id number] Class: Historical Med  . Order #: [id number] Class: Historical Med  . Order #: [id number] Class: Historical Med  . Order #: [id number] Class: Historical Med  . Order #: [id number] Class: Historical Med  . Order #: [id number] Class: Historical Med  . Order #: [id number] Class: Historical Med  . Order #: [id number] Class: Historical Med  . Order #: [id number] Class: Historical Med  . Order #: [id number] Class: Historical Med  . Order #: [id number] Class: Historical Med  . Order #: [id number] Class: Historical Med  . Order #: [id number] Class: Historical Med  . Order #: [id number] Class: Historical Med  . Order #: [id number] Class: Historical Med  . Order #: [id number] Class: Historical Med  . Order #: [id number] Class: Historical Med  . Order #: [id number] Class: Historical Med  . Order #: [id number] Class: Historical Med  . Order #: [id number] Class: Print  . Order #: [id number] Class: Historical Med  .  Order #: [id number] Class: Print  . Order #: [id number] Class: Historical Med  . Order #: [id number] Class: Historical Med  . Order #: [id number] Class: Normal  . Order #: [id number] Class: Print  . Order #: [id number] Class: Historical Med    No current facility-administered  medications for this encounter.   Current Outpatient Prescriptions:  .  acetaminophen (TYLENOL) 500 MG tablet, Take 1,000 mg by mouth 2 (two) times daily as needed for mild pain or headache. , Disp: , Rfl:  .  albuterol (VENTOLIN HFA) 108 (90 BASE) MCG/ACT inhaler, Inhale 2 puffs into the lungs every 4 (four) hours as needed for wheezing. , Disp: , Rfl:  .  apixaban (ELIQUIS) 5 MG TABS tablet, Take by mouth., Disp: , Rfl:  .  atorvastatin (LIPITOR) 40 MG tablet, Take 40 mg by mouth daily. , Disp: , Rfl:  .  budesonide-formoterol (SYMBICORT) 160-4.5 MCG/ACT inhaler, Inhale 2 puffs into the lungs 2 (two) times daily. , Disp: , Rfl:  .  cholecalciferol (VITAMIN D) 1000 UNITS tablet, Take 2,000 Units by mouth daily., Disp: , Rfl:  .  Cholecalciferol (VITAMIN D-1000 MAX ST) 1000 UNITS tablet, Take by mouth., Disp: , Rfl:  .  clobetasol cream (TEMOVATE) [id number] %, Apply 1 application topically 2 (two) times daily. , Disp: , Rfl:  .  desonide (DESOWEN) 0.05 % ointment, Apply 1 application topically 2 (two) times daily., Disp: , Rfl:  .  Desonide POWD, Use. Apply as needed, Disp: , Rfl:  .  diclofenac sodium (VOLTAREN) 1 % GEL, Apply 2 grams bid prn, Disp: , Rfl:  .  fexofenadine (ALLEGRA) 180 MG tablet, Take 180 mg by mouth daily. , Disp: , Rfl:  .  fluticasone (FLONASE) 50 MCG/ACT nasal spray, USE 2 SPRAYS IN EACH NOSTRIL DAILY, Disp: , Rfl:  .  ipratropium (ATROVENT) 0.06 % nasal spray, Place 2 sprays into both nostrils 3 (three) times daily. , Disp: , Rfl:  .  levothyroxine (SYNTHROID, LEVOTHROID) 100 MCG tablet, Take on an empty stomach with a glass of water at least 30-60 minutes before breakfast., Disp: , Rfl:  .  levothyroxine (SYNTHROID, LEVOTHROID) 112 MCG tablet, Take 112 mcg by mouth daily before breakfast., Disp: , Rfl:  .  lidocaine (LIDODERM) 5 %, Place 1 patch onto the skin every 12 (twelve) hours as needed. , Disp: , Rfl:  .  montelukast (SINGULAIR) 10 MG tablet, Take 10 mg by mouth at  bedtime. TAKE 1 TABLET DAILY, Disp: , Rfl:  .  Omega-3 1000 MG CAPS, Take by mouth., Disp: , Rfl:  .  omega-3 acid ethyl esters (LOVAZA) 1 G capsule, Take 2 g by mouth daily., Disp: , Rfl:  .  ranitidine (ZANTAC) 150 MG tablet, Take 150 mg by mouth 2 (two) times daily. , Disp: , Rfl:  .  aspirin EC 81 MG tablet, Take 81 mg by mouth daily., Disp: , Rfl:  .  cephALEXin (KEFLEX) 500 MG capsule, Take 1 capsule (500 mg total) by mouth 3 (three) times daily., Disp: 21 capsule, Rfl: 0 .  colchicine 0.6 MG tablet, Take 0.6 mg by mouth daily. , Disp: , Rfl:  .  mupirocin ointment (BACTROBAN) 2 %, Apply three times a day for 5 days., Disp: 22 g, Rfl: 0 .  omeprazole (PRILOSEC) 20 MG capsule, Take 20 mg by mouth daily. , Disp: , Rfl:  .  potassium chloride SA (KLOR-CON M20) 20 MEQ tablet, Take 20 mEq by mouth daily. , Disp: , Rfl:  .  predniSONE (DELTASONE) 10 MG  tablet, 6 tabs po qd for 2 days, then 4 tabs po qd for 3 days, then 2 tabs po qd for 3 days, then 1 tab po qd for 3 days, Disp: 33 tablet, Rfl: 0 .  predniSONE (STERAPRED UNI-PAK 21 TAB) 10 MG (21) TBPK tablet, 6 tabs day 1 and 2, 5 tabs day 3 and 4, 4 tabs day 5 and 6, 3 tabs day 7 and 8, 2 tabs day 9 and 10, 1 tab day 11 and 12. Take orally, Disp: 42 tablet, Rfl: 0 .  tapentadol (NUCYNTA) 50 MG TABS tablet, Take 25-50 mg by mouth 3 (three) times daily as needed. 1/2-1 po tid prn, Disp: , Rfl:   Allergies Fluticasone-salmeterol; Albuterol; Ciprofloxacin; Codeine; Promethazine; Guaifenesin; and Sulfa antibiotics  History reviewed. No pertinent family history.  Social History Social History  Substance Use Topics  . Smoking status: Never Smoker  . Smokeless tobacco: Never Used  . Alcohol use 0.0 oz/week    Review of Systems Constitutional: No fever/chills Eyes: No visual changes. ENT: No sore throat. Cardiovascular: Denies chest pain. Respiratory: Denies shortness of breath. Gastrointestinal: No abdominal pain.  No nausea, no vomiting.   No diarrhea.  No constipation. Genitourinary: Negative for dysuria. Musculoskeletal: Negative for back pain. Skin: Negative for rash.As above. Neurological: Negative for headaches, focal weakness or numbness.  10-point ROS otherwise negative.  ____________________________________________   PHYSICAL EXAM:  VITAL SIGNS: ED Triage Vitals  Enc Vitals Group     BP 01/19/16 1549 (!) 132/95     Pulse Rate 01/19/16 1549 78     Resp 01/19/16 1549 17     Temp 01/19/16 1549 98 F (36.7 C)     Temp Source 01/19/16 1549 Oral     SpO2 01/19/16 1549 100 %     Weight 01/19/16 1551 149 lb (67.6 kg)     Height 01/19/16 1551 5\' 4"  (1.626 m)     Head Circumference --      Peak Flow --      Pain Score 01/19/16 1556 0     Pain Loc --      Pain Edu? --      Excl. in [place]? --     Constitutional: Alert and oriented. Well appearing and in no acute distress. Eyes: Conjunctivae are normal. PERRL. EOMI. ENT      Head: Normocephalic and atraumatic.      Mouth/Throat: Mucous membranes are moist. Cardiovascular: Normal rate, regular rhythm. Grossly normal heart sounds.  Good peripheral circulation. Respiratory: Normal respiratory effort without tachypnea nor retractions. Breath sounds are clear and equal bilaterally. No wheezes/rales/rhonchi.. Musculoskeletal: Ambulatory with steady gait. No midline cervical, thoracic or lumbar tenderness to palpation. Bilateral hand grips strong and equal. Bilateral distal radial pulses equal.  Neurologic:  Normal speech and language. No gross focal neurologic deficits are appreciated. Speech is normal. No gait instability.  Skin:  Skin is warm, dry and intact. No rash noted. Except: Right hand palmar aspect of the thumb fat pad 1.5 cm linear healing laceration present that is well approximated and crusting, with area of mild to moderate immediate surrounding erythema, no exudate or drainage, nontender, no swelling, right hand full range of motion-no motor or tendon  deficit, normal distal sensation and normal distal capillary refill. Psychiatric: Mood and affect are normal. Speech and behavior are normal. Patient exhibits appropriate insight and judgment   ___________________________________________   LABS (all labs ordered are listed, but only abnormal results are displayed)  Labs Reviewed - No data  to display   PROCEDURES Procedures   INITIAL IMPRESSION / ASSESSMENT AND PLAN / ED COURSE  Pertinent labs & imaging results that were available during my care of the patient were reviewed by me and considered in my medical decision making (see chart for details).  Well-appearing patient. No acute distress. Presents for evaluation of laceration that occurred 9 days ago. Laceration well approximated. Area of erythema surround wound, no fluctuance, no induration, no drainage. Suspect inflammatory changes with concern for skin infection. Will treat patient with oral cephalexin and topical Bactroban. Counseled regarding wound cleaning and close monitoring. Tetanus immunization updated.   Discussed follow up with Primary care physician this week. Discussed follow up and return parameters including no resolution or any worsening concerns. Patient verbalized understanding and agreed to plan.   ____________________________________________   FINAL CLINICAL IMPRESSION(S) / ED DIAGNOSES  Final diagnoses:  Hand laceration, right, initial encounter  Skin infection     Discharge Medication List as of 01/19/2016  4:48 PM    START taking these medications   Details  cephALEXin (KEFLEX) 500 MG capsule Take 1 capsule (500 mg total) by mouth 3 (three) times daily., Starting Tue 01/19/2016, Until Tue 01/26/2016, Print        Note: This dictation was prepared with Dragon dictation along with smaller phrase technology. Any transcriptional errors that result from this process are unintentional.    Clinical Course      Marylene Land, NP 01/19/16 2209

## 2016-01-19 NOTE — ED Triage Notes (Signed)
Patient complains of laceration that occurred on her right hand thumb. Patient states that this occurred 9 days ago on a can that she was opening. Patient reports that area will just not heal. Patient reports that she has been using neosporin and bandages off and on. Patient reports that she has no idea when her last tetanus shot was.

## 2016-01-19 NOTE — Discharge Instructions (Signed)
Take medication as prescribed. Monitor closely.  Follow up with your primary care physician this week as needed. Return to Urgent care for new or worsening concerns.

## 2016-01-22 ENCOUNTER — Telehealth: Payer: Self-pay

## 2016-01-22 NOTE — Telephone Encounter (Signed)
Courtesy call back completed today for patient's recent visit at [facility]. Patient did not answer, left message on machine to call back with any questions or concerns.   

## 2016-02-02 ENCOUNTER — Telehealth: Payer: Self-pay

## 2016-02-14 ENCOUNTER — Ambulatory Visit
Admission: EM | Admit: 2016-02-14 | Discharge: 2016-02-14 | Disposition: A | Discharge status: Home / Self Care | Payer: Medicare Other | Attending: Family Medicine | Admitting: Family Medicine

## 2016-02-14 DIAGNOSIS — H578 Other specified disorders of eye and adnexa: Secondary | ICD-10-CM

## 2016-02-14 DIAGNOSIS — B309 Viral conjunctivitis, unspecified: Secondary | ICD-10-CM | POA: Diagnosis not present

## 2016-02-14 DIAGNOSIS — H5789 Other specified disorders of eye and adnexa: Secondary | ICD-10-CM

## 2016-02-14 MED ORDER — PREDNISONE 10 MG (48) PO TBPK: ORAL_TABLET | ORAL | 0 refills | Status: DC

## 2016-02-14 NOTE — ED Provider Notes (Signed)
[facility]    CSN: [id number] Arrival date & time: 02/14/16  [id number]  History   Chief Complaint Chief Complaint  Patient presents with  . Conjunctivitis   HPI  82 year old female with an extensive past medical history presents with complaints of bilateral periorbital redness and swelling. She also has redness of the conjunctiva and eye watering.  Patient states that she's been experiencing the above symptoms for the past 4 days. She thinks that this is been incited by allergens that she's been out doors doing yard work. She reports significant periorbital swelling and redness as well as itchy watery eyes. She has been using Patanol without significant improvement. No acute vision changes. No photophobia. No pain. No drainage from the eye, other than clear watering. No other symptoms. No other complaints at this time.  Past Medical History:  Diagnosis Date  . Asthma   . Atrial fibrillation ([place])   . Pacemaker   . Pseudogout of ankle or foot   . TIA (transient ischemic attack)    Patient Active Problem List   Diagnosis Date Noted  . Temporary cerebral vascular dysfunction 02/28/2014  . DD (diverticular disease) 02/28/2014  . Allergic rhinitis 02/28/2014  . Allergy status to sulfonamides 02/28/2014  . History of anticoagulant therapy 02/28/2014  . Arthritis 02/28/2014  . Airway hyperreactivity 02/28/2014  . A-fib ([place]) 02/28/2014  . Bilateral cataracts 02/28/2014  . Dermatitis, eczematoid 02/28/2014  . Allergy to environmental factors 02/28/2014  . Acid reflux 02/28/2014  . Polypharmacy 02/28/2014  . HLD (hyperlipidemia) 02/28/2014  . Adult hypothyroidism 02/28/2014  . BP (high blood pressure) 02/28/2014  . Arthritis, degenerative 02/28/2014  . Degenerative arthritis of hip 10/09/2013  . 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 Neck rigid 05/30/2013  . 1st degree AV block 10/10/2012  . History of biliary T-tube placement 06/14/2012  .  History of cardioversion 06/05/2012  . Avascular necrosis of femoral head ([place]) 06/01/2012  . Lumbar radiculopathy 06/01/2012  . DDD (degenerative disc disease), lumbar 06/01/2012  . H/O cardiac catheterization 10/20/2009    Past Surgical History:  Procedure Laterality Date  . ABLATION    . EYE SURGERY    . foot sugery    . HIP ARTHROPLASTY     right  . PACEMAKER INSERTION      OB History    No data available       Home Medications    Prior to Admission medications   Medication Sig Start Date End Date Taking? Authorizing Provider  acetaminophen (TYLENOL) 500 MG tablet Take 1,000 mg by mouth 2 (two) times daily as needed for mild pain or headache.     Historical Provider, MD  albuterol (VENTOLIN HFA) 108 (90 BASE) MCG/ACT inhaler Inhale 2 puffs into the lungs every 4 (four) hours as needed for wheezing.  01/20/14 01/19/16  Historical Provider, MD  apixaban (ELIQUIS) 5 MG TABS tablet Take by mouth. 06/26/13   Historical Provider, MD  aspirin EC 81 MG tablet Take 81 mg by mouth daily.    Historical Provider, MD  atorvastatin (LIPITOR) 40 MG tablet Take 40 mg by mouth daily.  11/14/13   Historical Provider, MD  budesonide-formoterol (SYMBICORT) 160-4.5 MCG/ACT inhaler Inhale 2 puffs into the lungs 2 (two) times daily.  09/09/13   Historical Provider, MD  cholecalciferol (VITAMIN D) 1000 UNITS tablet Take 2,000 Units by mouth daily.    Historical Provider, MD  Cholecalciferol (VITAMIN D-1000 MAX ST) 1000 UNITS tablet Take by mouth.  Historical Provider, MD  clobetasol cream (TEMOVATE) [id number] % Apply 1 application topically 2 (two) times daily.     Historical Provider, MD  colchicine 0.6 MG tablet Take 0.6 mg by mouth daily.  01/20/14   Historical Provider, MD  desonide (DESOWEN) 0.05 % ointment Apply 1 application topically 2 (two) times daily.    Historical Provider, MD  Desonide POWD Use. Apply as needed    Historical Provider, MD  diclofenac sodium (VOLTAREN) 1 % GEL Apply 2 grams bid  prn 08/22/13   Historical Provider, MD  fexofenadine (ALLEGRA) 180 MG tablet Take 180 mg by mouth daily.  01/02/14 01/19/16  Historical Provider, MD  fluticasone (FLONASE) 50 MCG/ACT nasal spray USE 2 SPRAYS IN EACH NOSTRIL DAILY 12/27/13   Historical Provider, MD  ipratropium (ATROVENT) 0.06 % nasal spray Place 2 sprays into both nostrils 3 (three) times daily.  09/25/13   Historical Provider, MD  levothyroxine (SYNTHROID, LEVOTHROID) 100 MCG tablet Take on an empty stomach with a glass of water at least 30-60 minutes before breakfast. 12/16/13   Historical Provider, MD  levothyroxine (SYNTHROID, LEVOTHROID) 112 MCG tablet Take 112 mcg by mouth daily before breakfast.    Historical Provider, MD  lidocaine (LIDODERM) 5 % Place 1 patch onto the skin every 12 (twelve) hours as needed.     Historical Provider, MD  montelukast (SINGULAIR) 10 MG tablet Take 10 mg by mouth at bedtime. TAKE 1 TABLET DAILY 12/27/13   Historical Provider, MD  mupirocin ointment (BACTROBAN) 2 % Apply three times a day for 5 days. 01/19/16   Marylene Land, NP  Omega-3 1000 MG CAPS Take by mouth.    Historical Provider, MD  omega-3 acid ethyl esters (LOVAZA) 1 G capsule Take 2 g by mouth daily.    Historical Provider, MD  omeprazole (PRILOSEC) 20 MG capsule Take 20 mg by mouth daily.     Historical Provider, MD  potassium chloride SA (KLOR-CON M20) 20 MEQ tablet Take 20 mEq by mouth daily.  01/20/14 01/21/15  Historical Provider, MD  predniSONE (STERAPRED UNI-PAK 48 TAB) 10 MG (48) TBPK tablet Per package instructions. 12 day pack. 02/14/16   Coral Spikes, DO  ranitidine (ZANTAC) 150 MG tablet Take 150 mg by mouth 2 (two) times daily.  07/19/13   Historical Provider, MD  tapentadol (NUCYNTA) 50 MG TABS tablet Take 25-50 mg by mouth 3 (three) times daily as needed. 1/2-1 po tid prn 11/05/13   Historical Provider, MD    Family History History reviewed. No pertinent family history.  Social History Social History  Substance Use Topics  .  Smoking status: Never Smoker  . Smokeless tobacco: Never Used  . Alcohol use 0.0 oz/week     Allergies   Fluticasone-salmeterol; Albuterol; Ciprofloxacin; Codeine; Promethazine; Guaifenesin; and Sulfa antibiotics   Review of Systems Review of Systems  Constitutional: Negative.   Eyes: Positive for redness and itching. Negative for discharge.   Physical Exam Triage Vital Signs ED Triage Vitals  Enc Vitals Group     BP 02/14/16 0929 134/68     Pulse Rate 02/14/16 0929 82     Resp 02/14/16 0929 18     Temp 02/14/16 0929 98 F (36.7 C)     Temp Source 02/14/16 0929 Oral     SpO2 02/14/16 0929 96 %     Weight 02/14/16 0928 149 lb (67.6 kg)     Height 02/14/16 0928 5\' 4"  (1.626 m)     Head Circumference --  Peak Flow --      Pain Score 02/14/16 0929 0     Pain Loc --      Pain Edu? --      Excl. in [place]? --    Updated Vital Signs BP 134/68 (BP Location: Right Arm)   Pulse 82   Temp 98 F (36.7 C) (Oral)   Resp 18   Ht 5\' 4"  (1.626 m)   Wt 149 lb (67.6 kg)   SpO2 96%   BMI 25.58 kg/m   Physical Exam  Constitutional: She is oriented to person, place, and time. She appears well-developed. No distress.  HENT:  Head: Normocephalic and atraumatic.  Eyes:  Right eye with mild periorbital redness and swelling. Left eye with conjunctival injection, and severe periorbital redness and some associated swelling.  Pulmonary/Chest: Effort normal.  Neurological: She is alert and oriented to person, place, and time.  Psychiatric: She has a normal mood and affect.  Vitals reviewed.  UC Treatments / Results  Labs (all labs ordered are listed, but only abnormal results are displayed) Labs Reviewed - No data to display  EKG  EKG Interpretation None       Radiology No results found.  Procedures Procedures (including critical care time)  Medications Ordered in UC Medications - No data to display   Initial Impression / Assessment and Plan / UC Course  I have  reviewed the triage vital signs and the nursing notes.  Pertinent labs & imaging results that were available during my care of the patient were reviewed by me and considered in my medical decision making (see chart for details).  Clinical Course  82 year old female presents with conjunctival injection and periorbital redness/swelling. This appears to be a allergic response. No evidence of infection at this time. Treating with continued Patanol as well as an over-the-counter antihistamine. Adding a course of prednisone as well. Encouraged to call ophthalmologist tomorrow morning for an appointment.  Final Clinical Impressions(s) / UC Diagnoses   Final diagnoses:  Acute viral conjunctivitis of both eyes  Periorbital swelling   New Prescriptions New Prescriptions   PREDNISONE (STERAPRED UNI-PAK 48 TAB) 10 MG (48) TBPK TABLET    Per package instructions. 12 day pack.     Coral Spikes, DO 02/14/16 1020

## 2016-02-14 NOTE — ED Triage Notes (Signed)
Pt presents with bilateral red swollen eyes, She states she has been working in the yard the last couple of days. Itching, watery drainage.

## 2016-02-17 ENCOUNTER — Telehealth: Payer: Self-pay

## 2016-02-17 NOTE — Telephone Encounter (Signed)
Courtesy call back completed today for patients recent visit at [facility]. Patient did not answer, left message on voicemail to call back with any questions or concerns.   

## 2016-03-01 ENCOUNTER — Other Ambulatory Visit: Payer: Self-pay | Admitting: Family Medicine

## 2016-03-01 DIAGNOSIS — Z1231 Encounter for screening mammogram for malignant neoplasm of breast: Secondary | ICD-10-CM

## 2016-03-28 ENCOUNTER — Ambulatory Visit
Admission: RE | Admit: 2016-03-28 | Discharge: 2016-03-28 | Disposition: A | Discharge status: Home / Self Care | Payer: Medicare Other | Source: Ambulatory Visit | Attending: Family Medicine | Admitting: Family Medicine

## 2016-03-28 DIAGNOSIS — Z1231 Encounter for screening mammogram for malignant neoplasm of breast: Secondary | ICD-10-CM | POA: Insufficient documentation

## 2016-05-06 ENCOUNTER — Other Ambulatory Visit: Payer: Self-pay | Admitting: Orthopedic Surgery

## 2016-05-06 ENCOUNTER — Ambulatory Visit
Admission: RE | Admit: 2016-05-06 | Discharge: 2016-05-06 | Disposition: A | Discharge status: Home / Self Care | Payer: Medicare Other | Source: Ambulatory Visit | Attending: Orthopedic Surgery | Admitting: Orthopedic Surgery

## 2016-05-06 DIAGNOSIS — T148XXA Other injury of unspecified body region, initial encounter: Secondary | ICD-10-CM

## 2016-05-06 DIAGNOSIS — S42451A Displaced fracture of lateral condyle of right humerus, initial encounter for closed fracture: Secondary | ICD-10-CM | POA: Insufficient documentation

## 2016-05-06 DIAGNOSIS — X58XXXA Exposure to other specified factors, initial encounter: Secondary | ICD-10-CM | POA: Insufficient documentation

## 2016-05-06 DIAGNOSIS — S42401A Unspecified fracture of lower end of right humerus, initial encounter for closed fracture: Secondary | ICD-10-CM | POA: Diagnosis present

## 2016-06-23 ENCOUNTER — Encounter: Payer: Self-pay | Admitting: Family Medicine

## 2016-06-29 ENCOUNTER — Encounter: Payer: Self-pay | Admitting: Hematology and Oncology

## 2016-06-29 ENCOUNTER — Inpatient Hospital Stay: Payer: Medicare Other | Attending: Hematology and Oncology | Admitting: Hematology and Oncology

## 2016-06-29 ENCOUNTER — Inpatient Hospital Stay: Payer: Medicare Other

## 2016-06-29 VITALS — BP 120/72 | HR 87 | Temp 98.0°F | Resp 18 | Ht 64.0 in | Wt 144.2 lb

## 2016-06-29 DIAGNOSIS — D801 Nonfamilial hypogammaglobulinemia: Secondary | ICD-10-CM | POA: Insufficient documentation

## 2016-06-29 DIAGNOSIS — J45909 Unspecified asthma, uncomplicated: Secondary | ICD-10-CM | POA: Diagnosis not present

## 2016-06-29 DIAGNOSIS — M109 Gout, unspecified: Secondary | ICD-10-CM | POA: Diagnosis not present

## 2016-06-29 DIAGNOSIS — Z950 Presence of cardiac pacemaker: Secondary | ICD-10-CM | POA: Insufficient documentation

## 2016-06-29 DIAGNOSIS — Z8673 Personal history of transient ischemic attack (TIA), and cerebral infarction without residual deficits: Secondary | ICD-10-CM | POA: Insufficient documentation

## 2016-06-29 DIAGNOSIS — D72829 Elevated white blood cell count, unspecified: Secondary | ICD-10-CM | POA: Insufficient documentation

## 2016-06-29 DIAGNOSIS — E079 Disorder of thyroid, unspecified: Secondary | ICD-10-CM | POA: Insufficient documentation

## 2016-06-29 DIAGNOSIS — K219 Gastro-esophageal reflux disease without esophagitis: Secondary | ICD-10-CM | POA: Diagnosis not present

## 2016-06-29 DIAGNOSIS — Z79899 Other long term (current) drug therapy: Secondary | ICD-10-CM | POA: Diagnosis not present

## 2016-06-29 DIAGNOSIS — I4891 Unspecified atrial fibrillation: Secondary | ICD-10-CM | POA: Insufficient documentation

## 2016-06-29 DIAGNOSIS — Z7982 Long term (current) use of aspirin: Secondary | ICD-10-CM | POA: Insufficient documentation

## 2016-06-29 LAB — CBC WITH DIFFERENTIAL/PLATELET
Basophils Absolute: 0.1 10*3/uL (ref 0–0.1)
Basophils Relative: 1 %
Eosinophils Absolute: 0.2 10*3/uL (ref 0–0.7)
Eosinophils Relative: 1 %
HCT: 41.9 % (ref 35.0–47.0)
Hemoglobin: 13.4 g/dL (ref 12.0–16.0)
Lymphocytes Relative: 15 %
Lymphs Abs: 2 10*3/uL (ref 1.0–3.6)
MCH: 27.3 pg (ref 26.0–34.0)
MCHC: 32 g/dL (ref 32.0–36.0)
MCV: 85.5 fL (ref 80.0–100.0)
Monocytes Absolute: 1.6 10*3/uL — ABNORMAL HIGH (ref 0.2–0.9)
Monocytes Relative: 12 %
Neutro Abs: 9.8 10*3/uL — ABNORMAL HIGH (ref 1.4–6.5)
Neutrophils Relative %: 71 %
Platelets: 283 10*3/uL (ref 150–440)
RBC: 4.9 MIL/uL (ref 3.80–5.20)
RDW: 15.5 % — ABNORMAL HIGH (ref 11.5–14.5)
WBC: 13.6 10*3/uL — ABNORMAL HIGH (ref 3.6–11.0)

## 2016-06-29 LAB — SEDIMENTATION RATE: Sed Rate: 15 mm/hr (ref 0–30)

## 2016-06-29 NOTE — Progress Notes (Signed)
[facility] day:  06/29/2016  Chief Complaint: [name] is a 83 y.o. female with leukocytosis who is referred in consultation by Dr. [name] for assessment and management.  HPI:  The patient notes a history of an elevated WBC in 2014 or 2015.  She was seen by Dr. Ma Hillock.  She was felt to have reactive leukocytosis.  Available labs revealed a hematocrit of 41.8, hemoglobin 13.1, platelets 377,000, WBC 13,800 with an ANC of 11,700.  ESR was 5.  CRP was 0.3.  LAP score 20 (25-130).  LDH was 203. BCR-ABL was negative.  Labs in 03/14/2013 revealed a negative SPEP.  IgG was 599 (low).  Flow cytometry revealed no leukemia, but an increased CD4:CD8 ratio.  CBC on 06/14/2016 revealed a hematocrit of 38.9, hemoglobin 12.5, MCV 86.8, platelets 339,000, WBC 11,500 with an ANC of 8500.  Differential included 74% segs, 12% lymphs, and 14% mixed.  CBC on 12/25/2015 revealed a WBC of 10,300.  CBC on 03/06/2014 revealed a WBC of 11,500.  Symptomatically, she feels fine.  She denies any B symptoms.  She describes a right elbow fracture and being in a splint from 04/2016 - 05/2016.  She has tinnitus and plans to go to the vestibular clinic.  She has used steroids (inhaled, topical, and oral) for asthma, pseudogout, and eczema.  She is on oral prednione 1-2 x/year for pseudogout.  She has an asthma flare requriing oral steroids in the spring and fall.  She also uses an inhaled steroid.  She uses clobetasol for eczema every month.   Past Medical History:  Diagnosis Date  . Arthritis   . Asthma   . Atrial fibrillation ([place])   . Cataract   . Eczema   . GERD (gastroesophageal reflux disease)   . Gout of wrist   . Heart murmur   . Pacemaker   . Pseudogout of ankle or foot   . Thyroid disease   . TIA (transient ischemic attack)     Past Surgical History:  Procedure Laterality Date  . ABLATION    . EYE SURGERY    . foot sugery    . HIP  ARTHROPLASTY     right  . PACEMAKER INSERTION      Family History  Problem Relation Age of Onset  . Leukemia Father   . Lung cancer Sister   . Breast cancer Neg Hx     Social History:  reports that she has never smoked. She has never used smokeless tobacco. She reports that she drinks alcohol. She reports that she does not use drugs.  She denies any exposure to radiation or toxins.  She was born in [place].  She previously lived in [place].  Her husband was in the [employer]  She worked for [employer].  She has been in [place] for 5 years.  She lives in [place].  She has no children.  The patient is accompanied by her sister , Malachy Mood, today.  Allergies:  Allergies  Allergen Reactions  . Fluticasone-Salmeterol Anaphylaxis  . Albuterol     Other reaction(s): Other (See Comments) It burns and makes her cough "too strong for me or something"  . Ciprofloxacin Other (See Comments)    Other reaction(s): Unknown Does not remember  . Codeine Hives  . Colchicine Diarrhea  . Promethazine Other (See Comments)    Unable to speak. Loss muscle control (PHENGERAN)  . Valacyclovir     Other reaction(s): Muscle  Pain Could hardly move  . Guaifenesin Palpitations  . Sulfa Antibiotics Rash    Current Medications: Current Outpatient Prescriptions  Medication Sig Dispense Refill  . acetaminophen (TYLENOL) 500 MG tablet Take 1,000 mg by mouth 2 (two) times daily as needed for mild pain or headache.     . albuterol (PROVENTIL HFA;VENTOLIN HFA) 108 (90 Base) MCG/ACT inhaler Inhale into the lungs.    Marland Kitchen albuterol (VENTOLIN HFA) 108 (90 BASE) MCG/ACT inhaler Inhale 2 puffs into the lungs every 4 (four) hours as needed for wheezing.     Marland Kitchen apixaban (ELIQUIS) 5 MG TABS tablet Take by mouth.    Marland Kitchen atorvastatin (LIPITOR) 40 MG tablet Take 40 mg by mouth daily.     . budesonide-formoterol (SYMBICORT) 160-4.5 MCG/ACT inhaler Inhale 2 puffs into the lungs 2 (two) times daily.     . Cholecalciferol  (VITAMIN D-1000 MAX ST) 1000 UNITS tablet Take by mouth.    . clobetasol cream (TEMOVATE) 8.54 % Apply 1 application topically 2 (two) times daily.     Marland Kitchen desonide (DESOWEN) 0.05 % ointment Apply 1 application topically 2 (two) times daily.    Marland Kitchen Desonide POWD Use. Apply as needed    . fluticasone (FLONASE) 50 MCG/ACT nasal spray USE 2 SPRAYS IN EACH NOSTRIL DAILY    . hydrocortisone valerate cream (WESTCORT) 0.2 % Apply topically.    Marland Kitchen ipratropium (ATROVENT) 0.06 % nasal spray Place 2 sprays into both nostrils 3 (three) times daily.     Marland Kitchen levothyroxine (SYNTHROID, LEVOTHROID) 112 MCG tablet Take 112 mcg by mouth daily before breakfast.    . meclizine (ANTIVERT) 12.5 MG tablet Take 12.5 mg by mouth daily.    . montelukast (SINGULAIR) 10 MG tablet Take 10 mg by mouth at bedtime. TAKE 1 TABLET DAILY    . olopatadine (PATANOL) 0.1 % ophthalmic solution     . Omega-3 1000 MG CAPS Take by mouth.    . potassium chloride SA (KLOR-CON M20) 20 MEQ tablet Take 20 mEq by mouth daily.     . predniSONE (STERAPRED UNI-PAK 48 TAB) 10 MG (48) TBPK tablet Per package instructions. 12 day pack. 48 tablet 0  . ranitidine (ZANTAC) 150 MG tablet Take 150 mg by mouth 2 (two) times daily.     . SYMBICORT 80-4.5 MCG/ACT inhaler     . aspirin EC 81 MG tablet Take 81 mg by mouth daily.    . cholecalciferol (VITAMIN D) 1000 UNITS tablet Take 2,000 Units by mouth daily.    . colchicine 0.6 MG tablet Take 0.6 mg by mouth daily.     . diclofenac sodium (VOLTAREN) 1 % GEL Apply 2 grams bid prn    . fexofenadine (ALLEGRA) 180 MG tablet Take 180 mg by mouth daily.     Marland Kitchen lidocaine (LIDODERM) 5 % Place 1 patch onto the skin every 12 (twelve) hours as needed.     . mupirocin ointment (BACTROBAN) 2 % Apply three times a day for 5 days. (Patient not taking: Reported on 06/29/2016) 22 g 0  . omega-3 acid ethyl esters (LOVAZA) 1 G capsule Take 2 g by mouth daily.    Marland Kitchen omeprazole (PRILOSEC) 20 MG capsule Take 20 mg by mouth daily.      . tapentadol (NUCYNTA) 50 MG TABS tablet Take 25-50 mg by mouth 3 (three) times daily as needed. 1/2-1 po tid prn     No current facility-administered medications for this visit.     Review of Systems:  GENERAL:  Feels fine.  No fevers, sweats or weight loss. PERFORMANCE STATUS (ECOG):  0 HEENT:  Tinnitus.  Hearing aide.  o visual changes, runny nose, sore throat, mouth sores or tenderness. Lungs: No shortness of breath or cough.  No hemoptysis. Cardiac:  No chest pain, palpitations, orthopnea, or PND.  Pacemaker.  AV node ablation.   GI:  No nausea, vomiting, diarrhea, constipation, melena or hematochezia. h/o diverticulitis. GU:  No urgency, frequency, dysuria, or hematuria. Musculoskeletal:  h/o right elbow fracture.  Pseudogout in hands and wrists.  h/o hip replacement for osteoarthritis 07/25/2014.  No back pain.  No muscle tenderness. Extremities:  No pain or swelling. Skin:  Eczema.  No rashes or skin changes. Neuro:  No headache, numbness or weakness, or coordination issues.  Balance issues for 1-2 years. Endocrine:  No diabetes, thyroid issues, hot flashes or night sweats. Psych:  No mood changes, depression or anxiety. Pain:  No focal pain. Review of systems:  All other systems reviewed and found to be negative.  Physical Exam: Blood pressure 120/72, pulse 87, temperature 98 F (36.7 C), temperature source Tympanic, resp. rate 18, height '5\' 4"'$  (1.626 m), weight 144 lb 2.9 oz (65.4 kg). GENERAL:  Thin elderly woman sitting comfortably in the exam room in no acute distress. MENTAL STATUS:  Alert and oriented to person, place and time. HEAD:  Styled gray hair.  Normocephalic, atraumatic, face symmetric, no Cushingoid features. EYES:  Glasses.  Blue eyes.  Pupils equal round and reactive to light and accomodation.  No conjunctivitis or scleral icterus. ENT:  Oropharynx clear without lesion.  Tongue normal. Mucous membranes moist.  RESPIRATORY:  Clear to auscultation without  rales, wheezes or rhonchi. CARDIOVASCULAR:  Regular rate and rhythm without murmur, rub or gallop. ABDOMEN:  Soft, non-tender, with active bowel sounds, and no hepatosplenomegaly.  No masses. SKIN:  No rashes, ulcers or lesions. EXTREMITIES: No edema, no skin discoloration or tenderness.  No palpable cords. LYMPH NODES: No palpable cervical, supraclavicular, axillary or inguinal adenopathy  NEUROLOGICAL: Unremarkable. PSYCH:  Appropriate.   Office Visit on 06/29/2016  Component Date Value Ref Range Status  . WBC 06/29/2016 13.6* 3.6 - 11.0 K/uL Final  . RBC 06/29/2016 4.90  3.80 - 5.20 MIL/uL Final  . Hemoglobin 06/29/2016 13.4  12.0 - 16.0 g/dL Final  . HCT 06/29/2016 41.9  35.0 - 47.0 % Final  . MCV 06/29/2016 85.5  80.0 - 100.0 fL Final  . MCH 06/29/2016 27.3  26.0 - 34.0 pg Final  . MCHC 06/29/2016 32.0  32.0 - 36.0 g/dL Final  . RDW 06/29/2016 15.5* 11.5 - 14.5 % Final  . Platelets 06/29/2016 283  150 - 440 K/uL Final  . Neutrophils Relative % 06/29/2016 71  % Final  . Neutro Abs 06/29/2016 9.8* 1.4 - 6.5 K/uL Final  . Lymphocytes Relative 06/29/2016 15  % Final  . Lymphs Abs 06/29/2016 2.0  1.0 - 3.6 K/uL Final  . Monocytes Relative 06/29/2016 12  % Final  . Monocytes Absolute 06/29/2016 1.6* 0.2 - 0.9 K/uL Final  . Eosinophils Relative 06/29/2016 1  % Final  . Eosinophils Absolute 06/29/2016 0.2  0 - 0.7 K/uL Final  . Basophils Relative 06/29/2016 1  % Final  . Basophils Absolute 06/29/2016 0.1  0 - 0.1 K/uL Final  . Path Review 06/29/2016    Final                   Value:Blood smear reviewed. Platelets are adequate and RBCs are  unremarkable. There is leukocytosis with increased neutrophils and monocytes. One metamyelocyte is noted, but no less mature forms are seen and there are no blasts. No morphologic dysplasia is  observed.    . Sed Rate 06/29/2016 15  0 - 30 mm/hr Final  . IgG (Immunoglobin G), Serum 06/29/2016 800  700 - 1,600 mg/dL Final  . IgA 06/29/2016 363  64 -  422 mg/dL Final  . IgM, Serum 06/29/2016 101  26 - 217 mg/dL Final  . Total Protein ELP 06/29/2016 6.5  6.0 - 8.5 g/dL Corrected  . Albumin SerPl Elph-Mcnc 06/29/2016 3.2  2.9 - 4.4 g/dL Corrected  . Alpha 1 06/29/2016 0.3  0.0 - 0.4 g/dL Corrected  . Alpha2 Glob SerPl Elph-Mcnc 06/29/2016 0.9  0.4 - 1.0 g/dL Corrected  . B-Globulin SerPl Elph-Mcnc 06/29/2016 1.0  0.7 - 1.3 g/dL Corrected  . Gamma Glob SerPl Elph-Mcnc 06/29/2016 1.0  0.4 - 1.8 g/dL Corrected  . M Protein SerPl Elph-Mcnc 06/29/2016 Not Observed  Not Observed g/dL Corrected  . Globulin, Total 06/29/2016 3.3  2.2 - 3.9 g/dL Corrected  . Albumin/Glob SerPl 06/29/2016 1.0  0.7 - 1.7 Corrected  . IFE 1 06/29/2016 Comment   Corrected  . Please Note 06/29/2016 Comment   Corrected   Comment: (NOTE) Protein electrophoresis scan will follow via computer, mail, or courier delivery. Performed At: [facility] [address] [place], [place] [id number] Lindon Romp MD [phone]     Assessment:  Britain Anagnos is a 83 y.o. female with mild leukocytosis likely secondary to steroid use.  She has used steroids (inhaled, topical, and oral) for asthma, pseudogout, and eczema.    Workup in 2014/2015 revealed a WBC 13,800 with an [place] of 11,700.  Normal studies included:  ESR, CRP, LAP score, LDH, BCR-ABL, SPEP, and flow cytometry.  IgG was 599 (low).   Recent labs on 06/14/2016 revealed a hematocrit of 38.9, hemoglobin 12.5, MCV 86.8, platelets 339,000, WBC 11,500 with an ANC of 8500.  Differential included 74% segs, 12% lymphs, and 14% mixed.    Symptomatically, she feels fine.  She denies any B symptoms.  Exam reveals no adenopathy of hepatosplenomegaly.  Plan: 1.  Discuss probable mild leukocytosis due to demargination of WBCs secondary to patient's use of steroids.  Differential is predominantly neutrophils.  She has no history of recurrent infections.  There is no evidence of leukemia.  Discuss limited laboratory  work-up.  Discuss follow-up of prior low IgG level. 2.  Labs today:  CBC with diff, ESR, SPEP, immunoglobulins. 3.  Peripheral smear for pathologic review. 4.  Call patient with results. 5.  RTC prn.   Lequita Asal, MD  06/29/2016, 11:27 AM

## 2016-06-29 NOTE — Progress Notes (Signed)
Patient here today as new evaluation, regarding leukocytosis.  Referred by Dr. Kandice Robinsons.  Patient saw Dr. Ma Hillock about 3 years ago (10-06-13) for same dx.  He discharged her and told her she did not have leukocytosis.

## 2016-06-30 LAB — PATHOLOGIST SMEAR REVIEW

## 2016-07-01 LAB — MULTIPLE MYELOMA PANEL, SERUM
Albumin SerPl Elph-Mcnc: 3.2 g/dL (ref 2.9–4.4)
Albumin/Glob SerPl: 1 (ref 0.7–1.7)
Alpha 1: 0.3 g/dL (ref 0.0–0.4)
Alpha2 Glob SerPl Elph-Mcnc: 0.9 g/dL (ref 0.4–1.0)
B-Globulin SerPl Elph-Mcnc: 1 g/dL (ref 0.7–1.3)
Gamma Glob SerPl Elph-Mcnc: 1 g/dL (ref 0.4–1.8)
Globulin, Total: 3.3 g/dL (ref 2.2–3.9)
IgA: 363 mg/dL (ref 64–422)
IgG (Immunoglobin G), Serum: 800 mg/dL (ref 700–1600)
IgM, Serum: 101 mg/dL (ref 26–217)
Total Protein ELP: 6.5 g/dL (ref 6.0–8.5)

## 2016-07-05 ENCOUNTER — Telehealth: Payer: Self-pay | Admitting: *Deleted

## 2016-07-05 ENCOUNTER — Other Ambulatory Visit: Payer: Self-pay | Admitting: *Deleted

## 2016-07-05 DIAGNOSIS — D7219 Other eosinophilia: Secondary | ICD-10-CM

## 2016-07-05 DIAGNOSIS — D721 Eosinophilia: Principal | ICD-10-CM

## 2016-07-05 NOTE — Telephone Encounter (Signed)
Called patient and informed her of her lab results and Dr. Kem Parkinson desire to see her in 6 months with labs to monitored for myeloproliferative disease, discussed with patient to call if she had any unusual symptoms or problems, voiced understanding. Scheduling to call her for appt.

## 2016-07-10 ENCOUNTER — Encounter: Payer: Self-pay | Admitting: Hematology and Oncology

## 2017-01-03 NOTE — Progress Notes (Signed)
[facility] day:  01/04/2017   Chief Complaint: [name] is a 83 y.o. female with leukocytosis who is seen for a 6 month assessment.  HPI:  The patient was last seen in the hematology clinic on 07/05/2016 for an initial evaluation of leukocytosis.  Previously, she had been seen in the hematology clinic by Dr. Ma Hillock.  She was felt to have a reactive leukocytosis since 2014 or 2015. At last visit to the hematology clinic, she felt "fine".  She denied any B symptoms. She had tinnitus and had plans to go to the vestibular clinic. Patient  took intermittent corticosteroid therapy (prednisone) 1-2 times a year for pseudogout, in addition to topical steroids for eczema, and inhaled steroids for his asthma.    Work-up on 06/29/2016 revealed a WBC of 13,600 with an ANC of 9800.  Differential included 71% neutrophils, 15% lymphocytes, and 12% monocytes.  Hemoglobin was 13.4, hematocrit 41.9, platelets 283,000.  Peripheral smear revealed leukocytosis with increased neutrophils and monocytes.  There was one metamyelocyte.   There was no morphologic dysplasia and no blasts.  Sedimentation rate and multiple myeloma panel were unremarkable.  IgG was 800.  Symptomatically, she is doing well. She recently found out that she had "at least two teeth (#12 and #14) that are abscessed and need to be extracted".  She notes that two of her upper teeth are oozing and need to come out.  She also needs to have a root canal in tooth #4.  She denies pain.  She denies any difficulty eating. She denies other recent infections.    Past Medical History:  Diagnosis Date  . Arthritis   . Asthma   . Atrial fibrillation ([place])   . Cataract   . Eczema   . GERD (gastroesophageal reflux disease)   . Gout of wrist   . Heart murmur   . Pacemaker   . Pseudogout of ankle or foot   . Thyroid disease   . TIA (transient ischemic attack)     Past Surgical History:  Procedure  Laterality Date  . ABLATION    . EYE SURGERY    . foot sugery    . HIP ARTHROPLASTY     right  . PACEMAKER INSERTION      Family History  Problem Relation Age of Onset  . Leukemia Father   . Lung cancer Sister   . Breast cancer Neg Hx     Social History:  reports that she has never smoked. She has never used smokeless tobacco. She reports that she drinks alcohol. She reports that she does not use drugs.  She denies any exposure to radiation or toxins.  She was born in [place].  She previously lived in [place].  Her husband was in the [employer].  She worked for [employer].  She has been in [place] for 5 years.  She lives in [place].  She has no children.  The patient is accompanied by her sister, Malachy Mood, today.  Allergies:  Allergies  Allergen Reactions  . Fluticasone-Salmeterol Anaphylaxis  . Albuterol     Other reaction(s): Other (See Comments) It burns and makes her cough "too strong for me or something"  . Ciprofloxacin Other (See Comments)    Other reaction(s): Unknown Does not remember  . Codeine Hives  . Colchicine Diarrhea  . Promethazine Other (See Comments)    Unable to speak. Loss muscle control (PHENGERAN)  . Valacyclovir  Other reaction(s): Muscle Pain Could hardly move  . Guaifenesin Palpitations  . Sulfa Antibiotics Rash    Current Medications: Current Outpatient Prescriptions  Medication Sig Dispense Refill  . acetaminophen (TYLENOL) 500 MG tablet Take 1,000 mg by mouth 2 (two) times daily as needed for mild pain or headache.     . albuterol (PROVENTIL HFA;VENTOLIN HFA) 108 (90 Base) MCG/ACT inhaler Inhale into the lungs.    Marland Kitchen apixaban (ELIQUIS) 5 MG TABS tablet Take by mouth.    Marland Kitchen aspirin EC 81 MG tablet Take 81 mg by mouth daily.    Marland Kitchen atorvastatin (LIPITOR) 40 MG tablet Take 40 mg by mouth daily.     . budesonide-formoterol (SYMBICORT) 160-4.5 MCG/ACT inhaler Inhale 2 puffs into the lungs 2 (two) times daily.     . cholecalciferol (VITAMIN  D) 1000 UNITS tablet Take 2,000 Units by mouth daily.    . Cholecalciferol (VITAMIN D-1000 MAX ST) 1000 UNITS tablet Take by mouth.    . clobetasol cream (TEMOVATE) 0.10 % Apply 1 application topically 2 (two) times daily.     . colchicine 0.6 MG tablet Take 0.6 mg by mouth daily.     Marland Kitchen desonide (DESOWEN) 0.05 % ointment Apply 1 application topically 2 (two) times daily.    Marland Kitchen Desonide POWD Use. Apply as needed    . diclofenac sodium (VOLTAREN) 1 % GEL Apply 2 grams bid prn    . fluticasone (FLONASE) 50 MCG/ACT nasal spray USE 2 SPRAYS IN EACH NOSTRIL DAILY    . hydrocortisone valerate cream (WESTCORT) 0.2 % Apply topically.    Marland Kitchen ipratropium (ATROVENT) 0.06 % nasal spray Place 2 sprays into both nostrils 3 (three) times daily.     Marland Kitchen levothyroxine (SYNTHROID, LEVOTHROID) 112 MCG tablet Take 112 mcg by mouth daily before breakfast.    . lidocaine (LIDODERM) 5 % Place 1 patch onto the skin every 12 (twelve) hours as needed.     . meclizine (ANTIVERT) 12.5 MG tablet Take 12.5 mg by mouth daily.    . montelukast (SINGULAIR) 10 MG tablet Take 10 mg by mouth at bedtime. TAKE 1 TABLET DAILY    . mupirocin ointment (BACTROBAN) 2 % Apply three times a day for 5 days. 22 g 0  . olopatadine (PATANOL) 0.1 % ophthalmic solution     . Omega-3 1000 MG CAPS Take by mouth.    . omega-3 acid ethyl esters (LOVAZA) 1 G capsule Take 2 g by mouth daily.    Marland Kitchen omeprazole (PRILOSEC) 20 MG capsule Take 20 mg by mouth daily.     . predniSONE (STERAPRED UNI-PAK 48 TAB) 10 MG (48) TBPK tablet Per package instructions. 12 day pack. 48 tablet 0  . ranitidine (ZANTAC) 150 MG tablet Take 150 mg by mouth 2 (two) times daily.     . SYMBICORT 80-4.5 MCG/ACT inhaler     . tapentadol (NUCYNTA) 50 MG TABS tablet Take 25-50 mg by mouth 3 (three) times daily as needed. 1/2-1 po tid prn    . albuterol (VENTOLIN HFA) 108 (90 BASE) MCG/ACT inhaler Inhale 2 puffs into the lungs every 4 (four) hours as needed for wheezing.     .  fexofenadine (ALLEGRA) 180 MG tablet Take 180 mg by mouth daily.     . potassium chloride SA (KLOR-CON M20) 20 MEQ tablet Take 20 mEq by mouth daily.      No current facility-administered medications for this visit.     Review of Systems:  GENERAL:  Feels fine.  No fevers or sweats.  Weight down 1 pound. PERFORMANCE STATUS (ECOG):  0 HEENT:  Tinnitus.  Dental issues (see HPI); needs root canal and extractions. Hearing aide.  No visual changes, runny nose, sore throat, mouth sores or tenderness. Lungs: No shortness of breath or cough.  No hemoptysis. Cardiac:  No chest pain, palpitations, orthopnea, or PND.  Pacemaker.  AV node ablation.   GI:  No nausea, vomiting, diarrhea, constipation, melena or hematochezia. h/o diverticulitis. GU:  No urgency, frequency, dysuria, or hematuria. Musculoskeletal:  Pseudogout in hands and wrists.  h/o hip replacement for osteoarthritis.  No back pain.  No muscle tenderness. Extremities:  No pain or swelling. Skin:  Eczema.  No rashes or skin changes. Neuro:  No headache, numbness or weakness, or coordination issues.  Balance issues for 1-2 years. Endocrine:  No diabetes, thyroid issues, hot flashes or night sweats. Psych:  No mood changes, depression or anxiety. Pain:  No focal pain. Review of systems:  All other systems reviewed and found to be negative.  Physical Exam: Blood pressure (!) 144/84, pulse 84, temperature 98.6 F (37 C), temperature source Tympanic, resp. rate 18, weight 143 lb 15.4 oz (65.3 kg). GENERAL:  Thin elderly woman sitting comfortably in the exam room in no acute distress.  She has a rolling walker at her side, MENTAL STATUS:  Alert and oriented to person, place and time. HEAD:  Styled gray hair.  Normocephalic, atraumatic, face symmetric, no Cushingoid features. EYES:  Glasses.  Blue eyes.  Pupils equal round and reactive to light and accomodation.  No conjunctivitis or scleral icterus. ENT:  Left upper dental discomfort on  biting.  Gum tender above tooth.  Oropharynx clear without lesion.  Tongue normal. Mucous membranes moist.  RESPIRATORY:  Clear to auscultation without rales, wheezes or rhonchi. CARDIOVASCULAR:  Regular rate and rhythm without murmur, rub or gallop. CHEST:  Left upper chest pacemaker. ABDOMEN:  Soft, non-tender, with active bowel sounds, and no hepatosplenomegaly.  No masses. SKIN:  No rashes, ulcers or lesions. EXTREMITIES: No edema, no skin discoloration or tenderness.  No palpable cords. LYMPH NODES: No palpable cervical, supraclavicular, axillary or inguinal adenopathy  NEUROLOGICAL: Unremarkable. PSYCH:  Appropriate.   Appointment on 01/04/2017  Component Date Value Ref Range Status  . WBC 01/04/2017 8.4  3.6 - 11.0 K/uL Final  . RBC 01/04/2017 4.87  3.80 - 5.20 MIL/uL Final  . Hemoglobin 01/04/2017 13.9  12.0 - 16.0 g/dL Final  . HCT 01/04/2017 41.7  35.0 - 47.0 % Final  . MCV 01/04/2017 85.6  80.0 - 100.0 fL Final  . MCH 01/04/2017 28.6  26.0 - 34.0 pg Final  . MCHC 01/04/2017 33.4  32.0 - 36.0 g/dL Final  . RDW 01/04/2017 15.5* 11.5 - 14.5 % Final  . Platelets 01/04/2017 254  150 - 440 K/uL Final  . Neutrophils Relative % 01/04/2017 62  % Final  . Neutro Abs 01/04/2017 5.3  1.4 - 6.5 K/uL Final  . Lymphocytes Relative 01/04/2017 23  % Final  . Lymphs Abs 01/04/2017 1.9  1.0 - 3.6 K/uL Final  . Monocytes Relative 01/04/2017 12  % Final  . Monocytes Absolute 01/04/2017 1.0* 0.2 - 0.9 K/uL Final  . Eosinophils Relative 01/04/2017 2  % Final  . Eosinophils Absolute 01/04/2017 0.1  0 - 0.7 K/uL Final  . Basophils Relative 01/04/2017 1  % Final  . Basophils Absolute 01/04/2017 0.1  0 - 0.1 K/uL Final  . Sodium 01/04/2017 135  135 - 145  mmol/L Final  . Potassium 01/04/2017 4.4  3.5 - 5.1 mmol/L Final  . Chloride 01/04/2017 102  101 - 111 mmol/L Final  . CO2 01/04/2017 26  22 - 32 mmol/L Final  . Glucose, Bld 01/04/2017 106* 65 - 99 mg/dL Final  . BUN 01/04/2017 <5* 6 - 20  mg/dL Final  . Creatinine, Ser 01/04/2017 0.67  0.44 - 1.00 mg/dL Final  . Calcium 01/04/2017 8.7* 8.9 - 10.3 mg/dL Final  . Total Protein 01/04/2017 7.0  6.5 - 8.1 g/dL Final  . Albumin 01/04/2017 3.9  3.5 - 5.0 g/dL Final  . AST 01/04/2017 26  15 - 41 U/L Final  . ALT 01/04/2017 15  14 - 54 U/L Final  . Alkaline Phosphatase 01/04/2017 164* 38 - 126 U/L Final  . Total Bilirubin 01/04/2017 1.0  0.3 - 1.2 mg/dL Final  . GFR calc non Af Amer 01/04/2017 >60  >60 mL/min Final  . GFR calc Af Amer 01/04/2017 >60  >60 mL/min Final   Comment: (NOTE) The eGFR has been calculated using the CKD EPI equation. This calculation has not been validated in all clinical situations. eGFR's persistently <60 mL/min signify possible Chronic Kidney Disease.   . Anion gap 01/04/2017 7  5 - 15 Final  . LDH 01/04/2017 151  98 - 192 U/L Final  . Uric Acid, Serum 01/04/2017 5.1  2.3 - 6.6 mg/dL Final    Assessment:  Ashlley Booher is a 83 y.o. female with mild leukocytosis likely secondary to steroid use.  She has used steroids (inhaled, topical, and oral) for asthma, pseudogout, and eczema.    Workup in 2014/2015 revealed a WBC 13,800 with an [place] of 11,700.  Normal studies included:  ESR, CRP, LAP score, LDH, BCR-ABL, SPEP, and flow cytometry.  IgG was 599 (low).   Labs on 06/14/2016 revealed a hematocrit of 38.9, hemoglobin 12.5, MCV 86.8, platelets 339,000, WBC 11,500 with an ANC of 8500.  Differential included 74% segs, 12% lymphs, and 14% mixed.    Work-up on 06/29/2016 labs revealed a WBC of 13,600 with an ANC of 9800. Hemoglobin was 13.4, hematocrit 41.9, platelets 283,000. Peripheral smear revealed leukocytosis with increased neutrophils and monocytes.  There was one metamyelocyte.   There was no morphologic dysplasia and no blasts.  Sedimentation rate and multiple myeloma panel were unremarkable.  IgG was 800.  Symptomatically, she has known dental issues.  Exam reveals 2 tender left upper teeth on  biting and tender gumline.  WBC is 8400 with an [place] of 5300.  Plan: 1.  Labs today:  CBC with diff, CMP, LDH, uric acid. 2.  Discuss mild reactive leukocytosis due to demargination of WBCs secondary to steroid use versus underlying dental infection.  3.  Follow up with dentist/oral surgeon for planned root canal and extractions. 4.  RTC prn.  Addendum:  Labs reveal an elevated alkaline phosphatase.  Labs will be forwarded to Dr. Raechel Ache for follow-up assessment.   Lizabeth Leyden, NP 01/04/2017   I saw and evaluated the patient, participating in the key portions of the service and reviewing pertinent diagnostic studies and records.  I reviewed the nurse practitioner's note and agree with the findings and the plan.  The assessment and plan were discussed with the patient.  A few questions were asked by the patient and answered.   Lequita Asal, MD 01/04/2017,4:55 PM

## 2017-01-04 ENCOUNTER — Inpatient Hospital Stay: Payer: Medicare Other

## 2017-01-04 ENCOUNTER — Encounter: Payer: Self-pay | Admitting: Hematology and Oncology

## 2017-01-04 ENCOUNTER — Inpatient Hospital Stay: Payer: Medicare Other | Attending: Hematology and Oncology | Admitting: Hematology and Oncology

## 2017-01-04 ENCOUNTER — Telehealth: Payer: Self-pay | Admitting: *Deleted

## 2017-01-04 VITALS — BP 144/84 | HR 84 | Temp 98.6°F | Resp 18 | Wt 144.0 lb

## 2017-01-04 DIAGNOSIS — Z7952 Long term (current) use of systemic steroids: Secondary | ICD-10-CM | POA: Diagnosis not present

## 2017-01-04 DIAGNOSIS — Z79899 Other long term (current) drug therapy: Secondary | ICD-10-CM | POA: Diagnosis not present

## 2017-01-04 DIAGNOSIS — I4891 Unspecified atrial fibrillation: Secondary | ICD-10-CM

## 2017-01-04 DIAGNOSIS — Z8673 Personal history of transient ischemic attack (TIA), and cerebral infarction without residual deficits: Secondary | ICD-10-CM | POA: Insufficient documentation

## 2017-01-04 DIAGNOSIS — K219 Gastro-esophageal reflux disease without esophagitis: Secondary | ICD-10-CM | POA: Diagnosis not present

## 2017-01-04 DIAGNOSIS — E079 Disorder of thyroid, unspecified: Secondary | ICD-10-CM | POA: Diagnosis not present

## 2017-01-04 DIAGNOSIS — L309 Dermatitis, unspecified: Secondary | ICD-10-CM | POA: Insufficient documentation

## 2017-01-04 DIAGNOSIS — R748 Abnormal levels of other serum enzymes: Secondary | ICD-10-CM | POA: Diagnosis not present

## 2017-01-04 DIAGNOSIS — J45909 Unspecified asthma, uncomplicated: Secondary | ICD-10-CM | POA: Insufficient documentation

## 2017-01-04 DIAGNOSIS — D72829 Elevated white blood cell count, unspecified: Secondary | ICD-10-CM

## 2017-01-04 DIAGNOSIS — Z950 Presence of cardiac pacemaker: Secondary | ICD-10-CM | POA: Insufficient documentation

## 2017-01-04 DIAGNOSIS — D721 Eosinophilia: Principal | ICD-10-CM

## 2017-01-04 DIAGNOSIS — Z7982 Long term (current) use of aspirin: Secondary | ICD-10-CM | POA: Diagnosis not present

## 2017-01-04 DIAGNOSIS — Z801 Family history of malignant neoplasm of trachea, bronchus and lung: Secondary | ICD-10-CM | POA: Diagnosis not present

## 2017-01-04 DIAGNOSIS — D7219 Other eosinophilia: Secondary | ICD-10-CM

## 2017-01-04 DIAGNOSIS — D72823 Leukemoid reaction: Secondary | ICD-10-CM

## 2017-01-04 LAB — CBC WITH DIFFERENTIAL/PLATELET
Basophils Absolute: 0.1 10*3/uL (ref 0–0.1)
Basophils Relative: 1 %
Eosinophils Absolute: 0.1 10*3/uL (ref 0–0.7)
Eosinophils Relative: 2 %
HCT: 41.7 % (ref 35.0–47.0)
Hemoglobin: 13.9 g/dL (ref 12.0–16.0)
Lymphocytes Relative: 23 %
Lymphs Abs: 1.9 10*3/uL (ref 1.0–3.6)
MCH: 28.6 pg (ref 26.0–34.0)
MCHC: 33.4 g/dL (ref 32.0–36.0)
MCV: 85.6 fL (ref 80.0–100.0)
Monocytes Absolute: 1 10*3/uL — ABNORMAL HIGH (ref 0.2–0.9)
Monocytes Relative: 12 %
Neutro Abs: 5.3 10*3/uL (ref 1.4–6.5)
Neutrophils Relative %: 62 %
Platelets: 254 10*3/uL (ref 150–440)
RBC: 4.87 MIL/uL (ref 3.80–5.20)
RDW: 15.5 % — ABNORMAL HIGH (ref 11.5–14.5)
WBC: 8.4 10*3/uL (ref 3.6–11.0)

## 2017-01-04 LAB — COMPREHENSIVE METABOLIC PANEL
ALT: 15 U/L (ref 14–54)
AST: 26 U/L (ref 15–41)
Albumin: 3.9 g/dL (ref 3.5–5.0)
Alkaline Phosphatase: 164 U/L — ABNORMAL HIGH (ref 38–126)
Anion gap: 7 (ref 5–15)
BUN: <5 mg/dL — ABNORMAL LOW (ref 6–20)
CO2: 26 mmol/L (ref 22–32)
Calcium: 8.7 mg/dL — ABNORMAL LOW (ref 8.9–10.3)
Chloride: 102 mmol/L (ref 101–111)
Creatinine, Ser: 0.67 mg/dL (ref 0.44–1.00)
GFR calc Af Amer: >60 mL/min (ref >60)
GFR calc non Af Amer: >60 mL/min (ref >60)
Glucose, Bld: 106 mg/dL — ABNORMAL HIGH (ref 65–99)
Potassium: 4.4 mmol/L (ref 3.5–5.1)
Sodium: 135 mmol/L (ref 135–145)
Total Bilirubin: 1 mg/dL (ref 0.3–1.2)
Total Protein: 7 g/dL (ref 6.5–8.1)

## 2017-01-04 LAB — LACTATE DEHYDROGENASE: LDH: 151 U/L (ref 98–192)

## 2017-01-04 LAB — URIC ACID: Uric Acid, Serum: 5.1 mg/dL (ref 2.3–6.6)

## 2017-01-04 NOTE — Progress Notes (Signed)
Patient states she recently saw an oral surgeon and she has 2 abcessed teeth that are oozing under the crowns on those teeth.  She is in the process of getting surgery scheduled. Otherwise no complaints.

## 2017-01-04 NOTE — Telephone Encounter (Signed)
Called patient to inform her that her alkaline phosphatase is elevated.  MD recommends patient follow up with PCP.  Called PCP's office and obtain appointment for Wednesday, September 19th @ 8:30.  Called patient and informed her of the appointment date/time.

## 2017-01-04 NOTE — Telephone Encounter (Signed)
-----  Message from Lequita Asal, MD sent at 01/04/2017 11:31 AM EDT ----- Regarding: Please call PCP and fax labs to them  Alk phosphatase is elevated.  Need follow-up.  M  ----- Message ----- From: Interface, Lab In [place] Sent: 01/04/2017  10:53 AM To: Lequita Asal, MD

## 2017-02-25 ENCOUNTER — Encounter: Payer: Self-pay | Admitting: Emergency Medicine

## 2017-02-25 ENCOUNTER — Emergency Department
Admission: EM | Admit: 2017-02-25 | Discharge: 2017-02-25 | Disposition: A | Discharge status: Home / Self Care | Payer: Medicare Other | Attending: Emergency Medicine | Admitting: Emergency Medicine

## 2017-02-25 DIAGNOSIS — I4891 Unspecified atrial fibrillation: Secondary | ICD-10-CM | POA: Insufficient documentation

## 2017-02-25 DIAGNOSIS — Z7982 Long term (current) use of aspirin: Secondary | ICD-10-CM | POA: Insufficient documentation

## 2017-02-25 DIAGNOSIS — J45909 Unspecified asthma, uncomplicated: Secondary | ICD-10-CM | POA: Insufficient documentation

## 2017-02-25 DIAGNOSIS — Z79899 Other long term (current) drug therapy: Secondary | ICD-10-CM | POA: Insufficient documentation

## 2017-02-25 DIAGNOSIS — K1379 Other lesions of oral mucosa: Secondary | ICD-10-CM

## 2017-02-25 DIAGNOSIS — K91840 Postprocedural hemorrhage and hematoma of a digestive system organ or structure following a digestive system procedure: Secondary | ICD-10-CM | POA: Insufficient documentation

## 2017-02-25 DIAGNOSIS — Z8673 Personal history of transient ischemic attack (TIA), and cerebral infarction without residual deficits: Secondary | ICD-10-CM | POA: Diagnosis not present

## 2017-02-25 LAB — CBC WITH DIFFERENTIAL/PLATELET
Basophils Absolute: 0.1 10*3/uL (ref 0–0.1)
Basophils Relative: 1 %
Eosinophils Absolute: 0.1 10*3/uL (ref 0–0.7)
Eosinophils Relative: 1 %
HCT: 44.5 % (ref 35.0–47.0)
Hemoglobin: 14.5 g/dL (ref 12.0–16.0)
Lymphocytes Relative: 19 %
Lymphs Abs: 1.9 10*3/uL (ref 1.0–3.6)
MCH: 29 pg (ref 26.0–34.0)
MCHC: 32.6 g/dL (ref 32.0–36.0)
MCV: 88.9 fL (ref 80.0–100.0)
Monocytes Absolute: 1.1 10*3/uL — ABNORMAL HIGH (ref 0.2–0.9)
Monocytes Relative: 11 %
Neutro Abs: 6.6 10*3/uL — ABNORMAL HIGH (ref 1.4–6.5)
Neutrophils Relative %: 68 %
Platelets: 256 10*3/uL (ref 150–440)
RBC: 5 MIL/uL (ref 3.80–5.20)
RDW: 15.2 % — ABNORMAL HIGH (ref 11.5–14.5)
WBC: 9.8 10*3/uL (ref 3.6–11.0)

## 2017-02-25 LAB — BASIC METABOLIC PANEL
Anion gap: 9 (ref 5–15)
BUN: 11 mg/dL (ref 6–20)
CO2: 28 mmol/L (ref 22–32)
Calcium: 9.2 mg/dL (ref 8.9–10.3)
Chloride: 99 mmol/L — ABNORMAL LOW (ref 101–111)
Creatinine, Ser: 0.55 mg/dL (ref 0.44–1.00)
GFR calc Af Amer: >60 mL/min (ref >60)
GFR calc non Af Amer: >60 mL/min (ref >60)
Glucose, Bld: 101 mg/dL — ABNORMAL HIGH (ref 65–99)
Potassium: 3.7 mmol/L (ref 3.5–5.1)
Sodium: 136 mmol/L (ref 135–145)

## 2017-02-25 LAB — PROTIME-INR
INR: 1.14
Prothrombin Time: 14.5 seconds (ref 11.4–15.2)

## 2017-02-25 MED ORDER — TRANEXAMIC ACID 1000 MG/10ML IV SOLN
500.0000 mg | Freq: Once | INTRAVENOUS | Status: AC
  Administered 2017-02-25: 500 mg via TOPICAL
  Filled 2017-02-25: qty 10

## 2017-02-25 MED ORDER — LIDOCAINE VISCOUS 2 % MT SOLN
15.0000 mL | Freq: Once | OROMUCOSAL | Status: DC
Start: 2017-02-25 — End: 2017-02-25

## 2017-02-25 MED ORDER — LIDOCAINE-EPINEPHRINE-TETRACAINE (LET) SOLUTION
3.0000 mL | Freq: Once | NASAL | Status: AC
  Administered 2017-02-25: 07:00:00 3 mL via TOPICAL
  Filled 2017-02-25: qty 3

## 2017-02-25 NOTE — Discharge Instructions (Addendum)
You were seen for postoperative bleeding after tooth extraction.  Bleeding was controlled with medicated gauze.  Please eat soft foods for the next 3 days; no chips or crunchy foods which may cut your gums.  Hold Eliquis for 1-2 days. Return to the ER for recurrent or worsening symptoms, persistent vomiting, difficulty breathing or other concerns.

## 2017-02-25 NOTE — ED Notes (Signed)
Patient does not appear to be in any acute distress at time of discharge. Patient ambulatory to lobby with steady gate. Patient denies any comments or concerns regarding discharge.  

## 2017-02-25 NOTE — ED Provider Notes (Signed)
[facility] Emergency Department Provider Note   ____________________________________________   First MD Initiated Contact with Patient 02/25/17 [phone]     (approximate)  I have reviewed the triage vital signs and the nursing notes.   HISTORY  Chief Complaint Dental Injury    HPI [name] is a 83 y.o. female who presents to the ED from home with a chief complaint of oral bleeding.  Patient takes Eliquis for atrial fibrillation.  She had 2 teeth extracted on the left side of her mouth on 10/25.  No bleeding until last night around 8:30 PM.  She was able to control the bleeding for a short period of time before it started bleeding again.  States it is bleeding from both the top and bottom extraction sites.  Denies lightheadedness, chest pain, shortness of breath, abdominal pain, nausea, vomiting.  Denies recent travel or trauma.  Nothing makes her symptoms better or worse.   Past Medical History:  Diagnosis Date  . Arthritis   . Asthma   . Atrial fibrillation ([place])   . Cataract   . Eczema   . GERD (gastroesophageal reflux disease)   . Gout of wrist   . Heart murmur   . Pacemaker   . Pseudogout of ankle or foot   . Thyroid disease   . TIA (transient ischemic attack)     Patient Active Problem List   Diagnosis Date Noted  . Leukocytosis 06/29/2016  . Hypogammaglobulinemia ([place]) 06/29/2016  . Temporary cerebral vascular dysfunction 02/28/2014  . DD (diverticular disease) 02/28/2014  . Allergic rhinitis 02/28/2014  . Allergy status to sulfonamides 02/28/2014  . History of anticoagulant therapy 02/28/2014  . Arthritis 02/28/2014  . Airway hyperreactivity 02/28/2014  . A-fib ([place]) 02/28/2014  . Bilateral cataracts 02/28/2014  . Dermatitis, eczematoid 02/28/2014  . Allergy to environmental factors 02/28/2014  . Acid reflux 02/28/2014  . Polypharmacy 02/28/2014  . HLD (hyperlipidemia) 02/28/2014  . Adult hypothyroidism 02/28/2014  .  BP (high blood pressure) 02/28/2014  . Arthritis, degenerative 02/28/2014  . Degenerative arthritis of hip 10/09/2013  . 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 Neck rigid 05/30/2013  . 1st degree AV block 10/10/2012  . History of biliary T-tube placement 06/14/2012  . History of cardioversion 06/05/2012  . Avascular necrosis of femoral head ([place]) 06/01/2012  . Lumbar radiculopathy 06/01/2012  . DDD (degenerative disc disease), lumbar 06/01/2012  . H/O cardiac catheterization 10/20/2009    Past Surgical History:  Procedure Laterality Date  . ABLATION    . EYE SURGERY    . foot sugery    . HIP ARTHROPLASTY     right  . PACEMAKER INSERTION      Prior to Admission medications   Medication Sig Start Date End Date Taking? Authorizing Provider  acetaminophen (TYLENOL) 500 MG tablet Take 1,000 mg by mouth 2 (two) times daily as needed for mild pain or headache.     [provider]  albuterol (PROVENTIL HFA;VENTOLIN HFA) 108 (90 Base) MCG/ACT inhaler Inhale into the lungs. 04/15/11   [provider]  albuterol (VENTOLIN HFA) 108 (90 BASE) MCG/ACT inhaler Inhale 2 puffs into the lungs every 4 (four) hours as needed for wheezing.  01/20/14 06/29/16  [provider]  apixaban (ELIQUIS) 5 MG TABS tablet Take by mouth. 06/26/13   [provider]  aspirin EC 81 MG tablet Take 81 mg by mouth daily.    [provider]  atorvastatin (LIPITOR) 40 MG tablet Take  40 mg by mouth daily.  11/14/13   [provider]  budesonide-formoterol (SYMBICORT) 160-4.5 MCG/ACT inhaler Inhale 2 puffs into the lungs 2 (two) times daily.  09/09/13   [provider]  cholecalciferol (VITAMIN D) 1000 UNITS tablet Take 2,000 Units by mouth daily.    [provider]  Cholecalciferol (VITAMIN D-1000 MAX ST) 1000 UNITS tablet Take by mouth.    [provider]  clobetasol cream (TEMOVATE) 7.89 % Apply 1 application topically 2  (two) times daily.     [provider]  colchicine 0.6 MG tablet Take 0.6 mg by mouth daily.  01/20/14   [provider]  desonide (DESOWEN) 0.05 % ointment Apply 1 application topically 2 (two) times daily.    [provider]  Desonide POWD Use. Apply as needed    [provider]  diclofenac sodium (VOLTAREN) 1 % GEL Apply 2 grams bid prn 08/22/13   [provider]  fexofenadine (ALLEGRA) 180 MG tablet Take 180 mg by mouth daily.  01/02/14 01/19/16  [provider]  fluticasone (FLONASE) 50 MCG/ACT nasal spray USE 2 SPRAYS IN EACH NOSTRIL DAILY 12/27/13   [provider]  hydrocortisone valerate cream (WESTCORT) 0.2 % Apply topically. 12/30/15   [provider]  ipratropium (ATROVENT) 0.06 % nasal spray Place 2 sprays into both nostrils 3 (three) times daily.  09/25/13   [provider]  levothyroxine (SYNTHROID, LEVOTHROID) 112 MCG tablet Take 112 mcg by mouth daily before breakfast.    [provider]  lidocaine (LIDODERM) 5 % Place 1 patch onto the skin every 12 (twelve) hours as needed.     [provider]  meclizine (ANTIVERT) 12.5 MG tablet Take 12.5 mg by mouth daily. 04/27/16   [provider]  montelukast (SINGULAIR) 10 MG tablet Take 10 mg by mouth at bedtime. TAKE 1 TABLET DAILY 12/27/13   [provider]  mupirocin ointment (BACTROBAN) 2 % Apply three times a day for 5 days. 01/19/16   Marylene Land, NP  olopatadine (PATANOL) 0.1 % ophthalmic solution  07/22/15   [provider]  Omega-3 1000 MG CAPS Take by mouth.    [provider]  omega-3 acid ethyl esters (LOVAZA) 1 G capsule Take 2 g by mouth daily.    [provider]  omeprazole (PRILOSEC) 20 MG capsule Take 20 mg by mouth daily.     [provider]  potassium chloride SA (KLOR-CON M20) 20 MEQ tablet Take 20 mEq by mouth daily.  01/20/14 06/29/16  [provider]  predniSONE (STERAPRED  UNI-PAK 48 TAB) 10 MG (48) TBPK tablet Per package instructions. 12 day pack. 02/14/16   Coral Spikes, DO  ranitidine (ZANTAC) 150 MG tablet Take 150 mg by mouth 2 (two) times daily.  07/19/13   [provider]  SYMBICORT 80-4.5 MCG/ACT inhaler  05/16/16   [provider]  tapentadol (NUCYNTA) 50 MG TABS tablet Take 25-50 mg by mouth 3 (three) times daily as needed. 1/2-1 po tid prn 11/05/13   [provider]    Allergies Fluticasone-salmeterol; Albuterol; Ciprofloxacin; Codeine; Colchicine; Promethazine; Valacyclovir; Guaifenesin; and Sulfa antibiotics  Family History  Problem Relation Age of Onset  . Leukemia Father   . Lung cancer Sister   . Breast cancer Neg Hx     Social History Social History  Substance Use Topics  . Smoking status: Never Smoker  . Smokeless tobacco: Never Used  . Alcohol use 0.0 oz/week    Review of  Systems  Constitutional: No fever/chills. Eyes: No visual changes. ENT: Positive for oral bleeding.  No sore throat. Cardiovascular: Denies chest pain. Respiratory: Denies shortness of breath. Gastrointestinal: No abdominal pain.  No nausea, no vomiting.  No diarrhea.  No constipation. Genitourinary: Negative for dysuria. Musculoskeletal: Negative for back pain. Skin: Negative for rash. Neurological: Negative for headaches, focal weakness or numbness.   ____________________________________________   PHYSICAL EXAM:  VITAL SIGNS: ED Triage Vitals  Enc Vitals Group     BP 02/25/17 0201 (!) 149/85     Pulse Rate 02/25/17 0201 80     Resp 02/25/17 0201 18     Temp 02/25/17 0201 98.7 F (37.1 C)     Temp Source 02/25/17 0201 Oral     SpO2 02/25/17 0201 98 %     Weight 02/25/17 0201 145 lb (65.8 kg)     Height 02/25/17 0201 5\' 4"  (1.626 m)     Head Circumference --      Peak Flow --      Pain Score 02/25/17 0435 1     Pain Loc --      Pain Edu? --      Excl. in [place]? --     Constitutional: Alert and oriented. Well  appearing and in no acute distress. Eyes: Conjunctivae are normal. PERRL. EOMI. Head: Atraumatic. Nose: No congestion/rhinnorhea. Mouth/Throat: Evidence of old blood in mouth and on tongue.  Reexamined after patient rinsed her mouth with water.  There is currently no active bleeding.  Postoperative sites intact.  Area of irritation to left upper gumline above incisor. Neck: No stridor.   Cardiovascular: Normal rate, irregular rhythm. Grossly normal heart sounds.  Good peripheral circulation. Respiratory: Normal respiratory effort.  No retractions. Lungs CTAB. Gastrointestinal: Soft and nontender. No distention. No abdominal bruits. No CVA tenderness. Musculoskeletal: No lower extremity tenderness nor edema.  No joint effusions. Neurologic:  Normal speech and language. No gross focal neurologic deficits are appreciated. No gait instability. Skin:  Skin is warm, dry and intact. No rash noted. Psychiatric: Mood and affect are normal. Speech and behavior are normal.  ____________________________________________   LABS (all labs ordered are listed, but only abnormal results are displayed)  Labs Reviewed  CBC WITH DIFFERENTIAL/PLATELET  BASIC METABOLIC PANEL  PROTIME-INR   ____________________________________________  EKG  None ____________________________________________  RADIOLOGY  No results found.  ____________________________________________   PROCEDURES  Procedure(s) performed: None  Procedures  Critical Care performed: No  ____________________________________________   INITIAL IMPRESSION / ASSESSMENT AND PLAN / ED COURSE  As part of my medical decision making, I reviewed the following data within the electronic MEDICAL RECORD NUMBER History obtained from family, Nursing notes reviewed and incorporated and Notes from prior ED visits.   83 year old female on Eliquis who presents with bleeding from post extraction sites.  She has been bleeding for several hours.   Although she is not complaining of lightheadedness, chest pain or shortness of breath, will check basic lab work.   Clinical Course as of Feb 26 727  Sat Feb 25, 2017  0657 Although patient was not actively bleeding, an irritated source along her left upper gumline above her incisor was identified.  LeT applied for comfort.  TXA soaked gauze applied with direct pressure.  Will reassess in 15-20 minutes.  [JS]  0727 Removed packing.  No active bleeding.  Updated patient and family member of laboratory results.  She is feeling overall better.  She is comfortable holding Eliquis for at least today and also  possibly tomorrow.  Will discharge at 7:40 AM if there is no recurrence of bleeding.  Strict return precautions given.  Both verbalized understanding and agree with plan of care.  [JS]    Clinical Course User Index [JS] Paulette Blanch, MD     ____________________________________________   FINAL CLINICAL IMPRESSION(S) / ED DIAGNOSES  Final diagnoses:  Oral bleeding      NEW MEDICATIONS STARTED DURING THIS VISIT:  New Prescriptions   No medications on file     Note:  This document was prepared using Dragon voice recognition software and may include unintentional dictation errors.    Paulette Blanch, MD 02/25/17 [phone]

## 2017-02-25 NOTE — ED Triage Notes (Signed)
Pt had 2 teeth removed on the left side of her mouth on 10/25. Pt reports no bleeding until last night at 2030. Pt has been unable to contain bleeding since. Upon arrival pt still actively bleeding. Gauze and pressure applied to the area.

## 2017-04-10 ENCOUNTER — Other Ambulatory Visit: Payer: Self-pay | Admitting: Internal Medicine

## 2017-04-10 DIAGNOSIS — Z1231 Encounter for screening mammogram for malignant neoplasm of breast: Secondary | ICD-10-CM

## 2017-05-03 ENCOUNTER — Other Ambulatory Visit: Payer: Self-pay | Admitting: Internal Medicine

## 2017-05-03 ENCOUNTER — Ambulatory Visit
Admission: RE | Admit: 2017-05-03 | Discharge: 2017-05-03 | Disposition: A | Discharge status: Home / Self Care | Payer: Medicare Other | Source: Ambulatory Visit | Attending: Internal Medicine | Admitting: Internal Medicine

## 2017-05-03 DIAGNOSIS — Z1231 Encounter for screening mammogram for malignant neoplasm of breast: Secondary | ICD-10-CM

## 2017-10-31 ENCOUNTER — Other Ambulatory Visit: Payer: Self-pay | Admitting: Nurse Practitioner

## 2017-10-31 DIAGNOSIS — Z7901 Long term (current) use of anticoagulants: Secondary | ICD-10-CM

## 2017-10-31 DIAGNOSIS — W19XXXA Unspecified fall, initial encounter: Secondary | ICD-10-CM

## 2017-11-01 ENCOUNTER — Other Ambulatory Visit: Payer: Self-pay | Admitting: Nurse Practitioner

## 2017-11-01 ENCOUNTER — Ambulatory Visit
Admission: RE | Admit: 2017-11-01 | Discharge: 2017-11-01 | Disposition: A | Discharge status: Home / Self Care | Payer: Medicare Other | Source: Ambulatory Visit | Attending: Nurse Practitioner | Admitting: Nurse Practitioner

## 2017-11-01 DIAGNOSIS — W19XXXA Unspecified fall, initial encounter: Secondary | ICD-10-CM

## 2017-11-01 DIAGNOSIS — R51 Headache: Secondary | ICD-10-CM | POA: Diagnosis not present

## 2017-11-01 DIAGNOSIS — Y92009 Unspecified place in unspecified non-institutional (private) residence as the place of occurrence of the external cause: Secondary | ICD-10-CM

## 2017-11-01 DIAGNOSIS — R519 Headache, unspecified: Secondary | ICD-10-CM

## 2017-11-01 DIAGNOSIS — R9089 Other abnormal findings on diagnostic imaging of central nervous system: Secondary | ICD-10-CM

## 2017-11-01 DIAGNOSIS — Z7901 Long term (current) use of anticoagulants: Secondary | ICD-10-CM

## 2018-03-17 ENCOUNTER — Other Ambulatory Visit: Payer: Self-pay

## 2018-03-17 ENCOUNTER — Emergency Department: Payer: Medicare Other

## 2018-03-17 ENCOUNTER — Encounter: Payer: Self-pay | Admitting: Emergency Medicine

## 2018-03-17 ENCOUNTER — Emergency Department
Admission: EM | Admit: 2018-03-17 | Discharge: 2018-03-17 | Disposition: A | Discharge status: Home / Self Care | Payer: Medicare Other | Attending: Emergency Medicine | Admitting: Emergency Medicine

## 2018-03-17 ENCOUNTER — Ambulatory Visit (INDEPENDENT_AMBULATORY_CARE_PROVIDER_SITE_OTHER)
Admission: EM | Admit: 2018-03-17 | Discharge: 2018-03-17 | Disposition: A | Discharge status: Home / Self Care | Payer: Medicare Other | Source: Home / Self Care | Attending: Family Medicine | Admitting: Family Medicine

## 2018-03-17 DIAGNOSIS — E039 Hypothyroidism, unspecified: Secondary | ICD-10-CM | POA: Diagnosis not present

## 2018-03-17 DIAGNOSIS — Y999 Unspecified external cause status: Secondary | ICD-10-CM | POA: Insufficient documentation

## 2018-03-17 DIAGNOSIS — Z79899 Other long term (current) drug therapy: Secondary | ICD-10-CM | POA: Insufficient documentation

## 2018-03-17 DIAGNOSIS — Z950 Presence of cardiac pacemaker: Secondary | ICD-10-CM | POA: Diagnosis not present

## 2018-03-17 DIAGNOSIS — Y929 Unspecified place or not applicable: Secondary | ICD-10-CM | POA: Insufficient documentation

## 2018-03-17 DIAGNOSIS — J45909 Unspecified asthma, uncomplicated: Secondary | ICD-10-CM | POA: Diagnosis not present

## 2018-03-17 DIAGNOSIS — W1839XA Other fall on same level, initial encounter: Secondary | ICD-10-CM | POA: Diagnosis not present

## 2018-03-17 DIAGNOSIS — Z7982 Long term (current) use of aspirin: Secondary | ICD-10-CM | POA: Insufficient documentation

## 2018-03-17 DIAGNOSIS — Z7901 Long term (current) use of anticoagulants: Secondary | ICD-10-CM | POA: Diagnosis not present

## 2018-03-17 DIAGNOSIS — Y9301 Activity, walking, marching and hiking: Secondary | ICD-10-CM | POA: Insufficient documentation

## 2018-03-17 DIAGNOSIS — S0990XA Unspecified injury of head, initial encounter: Secondary | ICD-10-CM | POA: Insufficient documentation

## 2018-03-17 DIAGNOSIS — W010XXA Fall on same level from slipping, tripping and stumbling without subsequent striking against object, initial encounter: Secondary | ICD-10-CM | POA: Insufficient documentation

## 2018-03-17 NOTE — ED Notes (Signed)
Pt ambulatory in room with no c/o of pain. Full movement x4 and no AMS.

## 2018-03-17 NOTE — ED Triage Notes (Addendum)
States fell backwards about 10am while opening gate and backing up lost balance. No LOC. Denies headache at present. States takes Eloquis for atrial fibrillation.

## 2018-03-17 NOTE — Discharge Instructions (Addendum)
Recommend go directly to the emergency room for further evaluation.

## 2018-03-17 NOTE — ED Provider Notes (Signed)
[facility] ____________________________________________  Time seen: Approximately 1:04 PM  I have reviewed the triage vital signs and the nursing notes.   HISTORY  Chief Complaint Fall and Head Injury  HPI [name] is a 84 y.o. female presenting for evaluation after head injury that occurred at approximately 10 AM this morning.  Patient states that she was outside at home, opening a gate.  States when she open the gate she stepped backwards into the flower area causing her to lose her balance and fall backwards.  States that when she fell she did hit her head on a mulch area.  States that since this time she has had a mild headache with accompanying "woozy "feeling.  States she has frequent falls and is supposed to use her cane at all times.  States that she did not have her cane with her at that time.  Patient is currently on Eliquis.  Denies any known break in skin.  States initially she was having some back pain as well as right hip pain after the fall but reports this is since resolved.  Has continued to ambulate.  Denies changes in vision, lasting neck or back or hip pain, chest pain, shortness of breath, loss of consciousness, syncope.  Reports her current woozy feeling is atypical for her.  No alleviating measures attempted.  Denies aggravating factors.  Reports her nephew brought her to urgent care.  Ezequiel Kayser, MD: PCP   Past Medical History:  Diagnosis Date  . Arthritis   . Asthma   . Atrial fibrillation ([place])   . Cataract   . Eczema   . GERD (gastroesophageal reflux disease)   . Gout of wrist   . Heart murmur   . Pacemaker   . Pseudogout of ankle or foot   . Thyroid disease   . TIA (transient ischemic attack)     Patient Active Problem List   Diagnosis Date Noted  . Leukocytosis 06/29/2016  . Hypogammaglobulinemia ([place]) 06/29/2016  . Temporary cerebral vascular dysfunction 02/28/2014  . DD (diverticular disease) 02/28/2014  . Allergic  rhinitis 02/28/2014  . Allergy status to sulfonamides 02/28/2014  . History of anticoagulant therapy 02/28/2014  . Arthritis 02/28/2014  . Airway hyperreactivity 02/28/2014  . A-fib ([place]) 02/28/2014  . Bilateral cataracts 02/28/2014  . Dermatitis, eczematoid 02/28/2014  . Allergy to environmental factors 02/28/2014  . Acid reflux 02/28/2014  . Polypharmacy 02/28/2014  . HLD (hyperlipidemia) 02/28/2014  . Adult hypothyroidism 02/28/2014  . BP (high blood pressure) 02/28/2014  . Arthritis, degenerative 02/28/2014  . Degenerative arthritis of hip 10/09/2013  . 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 Neck rigid 05/30/2013  . 1st degree AV block 10/10/2012  . History of biliary T-tube placement 06/14/2012  . History of cardioversion 06/05/2012  . Avascular necrosis of femoral head ([place]) 06/01/2012  . Lumbar radiculopathy 06/01/2012  . DDD (degenerative disc disease), lumbar 06/01/2012  . H/O cardiac catheterization 10/20/2009    Past Surgical History:  Procedure Laterality Date  . ABLATION    . EYE SURGERY    . foot sugery    . HIP ARTHROPLASTY     right  . PACEMAKER INSERTION       No current facility-administered medications for this encounter.   Current Outpatient Medications:  .  albuterol (PROVENTIL HFA;VENTOLIN HFA) 108 (90 Base) MCG/ACT inhaler, Inhale into the lungs., Disp: , Rfl:  .  apixaban (ELIQUIS) 5 MG TABS tablet, Take by mouth., Disp: , Rfl:  .  aspirin EC 81 MG tablet, Take 81 mg by mouth daily., Disp: , Rfl:  .  atorvastatin (LIPITOR) 40 MG tablet, Take 40 mg by mouth daily. , Disp: , Rfl:  .  budesonide-formoterol (SYMBICORT) 160-4.5 MCG/ACT inhaler, Inhale 2 puffs into the lungs 2 (two) times daily. , Disp: , Rfl:  .  cholecalciferol (VITAMIN D) 1000 UNITS tablet, Take 2,000 Units by mouth daily., Disp: , Rfl:  .  Cholecalciferol (VITAMIN D-1000 MAX ST) 1000 UNITS tablet, Take by mouth., Disp: , Rfl:  .  clobetasol cream  (TEMOVATE) 1.69 %, Apply 1 application topically 2 (two) times daily. , Disp: , Rfl:  .  colchicine 0.6 MG tablet, Take 0.6 mg by mouth daily. , Disp: , Rfl:  .  desonide (DESOWEN) 0.05 % ointment, Apply 1 application topically 2 (two) times daily., Disp: , Rfl:  .  diclofenac sodium (VOLTAREN) 1 % GEL, Apply 2 grams bid prn, Disp: , Rfl:  .  fluticasone (FLONASE) 50 MCG/ACT nasal spray, USE 2 SPRAYS IN EACH NOSTRIL DAILY, Disp: , Rfl:  .  hydrocortisone valerate cream (WESTCORT) 0.2 %, Apply topically., Disp: , Rfl:  .  ipratropium (ATROVENT) 0.06 % nasal spray, Place 2 sprays into both nostrils 3 (three) times daily. , Disp: , Rfl:  .  levothyroxine (SYNTHROID, LEVOTHROID) 112 MCG tablet, Take 112 mcg by mouth daily before breakfast., Disp: , Rfl:  .  montelukast (SINGULAIR) 10 MG tablet, Take 10 mg by mouth at bedtime. TAKE 1 TABLET DAILY, Disp: , Rfl:  .  Omega-3 1000 MG CAPS, Take by mouth., Disp: , Rfl:  .  omeprazole (PRILOSEC) 20 MG capsule, Take 20 mg by mouth daily. , Disp: , Rfl:  .  ranitidine (ZANTAC) 150 MG tablet, Take 150 mg by mouth 2 (two) times daily. , Disp: , Rfl:  .  SYMBICORT 80-4.5 MCG/ACT inhaler, , Disp: , Rfl:  .  acetaminophen (TYLENOL) 500 MG tablet, Take 1,000 mg by mouth 2 (two) times daily as needed for mild pain or headache. , Disp: , Rfl:  .  HYDROcodone-acetaminophen (NORCO/VICODIN) 5-325 MG tablet, Take 1 tablet by mouth every 6 (six) hours as needed for pain., Disp: , Rfl:  .  lidocaine (LIDODERM) 5 %, Place 1 patch onto the skin every 12 (twelve) hours as needed. , Disp: , Rfl:  .  meclizine (ANTIVERT) 12.5 MG tablet, Take 12.5 mg by mouth daily., Disp: , Rfl:  .  Olopatadine HCl (PAZEO) 0.7 % SOLN, Apply 1 drop to eye daily., Disp: , Rfl:  .  potassium chloride SA (KLOR-CON M20) 20 MEQ tablet, Take 20 mEq by mouth daily. , Disp: , Rfl:   Allergies Fluticasone-salmeterol; Albuterol; Ciprofloxacin; Codeine; Colchicine; Promethazine; Valacyclovir; Guaifenesin;  and Sulfa antibiotics  Family History  Problem Relation Age of Onset  . Leukemia Father   . Lung cancer Sister   . Breast cancer Neg Hx     Social History Social History   Tobacco Use  . Smoking status: Never Smoker  . Smokeless tobacco: Never Used  Substance Use Topics  . Alcohol use: Yes    Alcohol/week: 0.0 standard drinks  . Drug use: No    Review of Systems Constitutional: No fever Eyes: No visual changes. Cardiovascular: Denies chest pain. Respiratory: Denies shortness of breath. Gastrointestinal: No abdominal pain.  No nausea, no vomiting.   Musculoskeletal: As above.  Skin: Negative for rash. Neurological: Positive for headaches. Negative focal weakness or numbness.    ____________________________________________   PHYSICAL EXAM:  VITAL SIGNS: ED Triage Vitals  Enc Vitals Group     BP 03/17/18 1230 (!) 166/85     Pulse Rate 03/17/18 1230 72     Resp 03/17/18 1230 14     Temp 03/17/18 1230 97.6 F (36.4 C)     Temp Source 03/17/18 1230 Oral     SpO2 03/17/18 1230 100 %     Weight 03/17/18 1228 145 lb (65.8 kg)     Height 03/17/18 1228 5\' 4"  (1.626 m)     Head Circumference --      Peak Flow --      Pain Score 03/17/18 1228 1     Pain Loc --      Pain Edu? --      Excl. in [place]? --     Constitutional: Alert and oriented. Well appearing and in no acute distress. Eyes: Conjunctivae are normal. PERRL.  ENT      Head: Normocephalic and atraumatic.Nontender.  Cardiovascular: Normal rate, regular rhythm. Grossly normal heart sounds.  Good peripheral circulation. Respiratory: Normal respiratory effort without tachypnea nor retractions. Breath sounds are clear and equal bilaterally. No wheezes, rales, rhonchi. Gastrointestinal: Soft and nontender. No CVA tenderness. Musculoskeletal: No midline cervical, thoracic or lumbar tenderness to palpation. No hip tenderness to palpation. Neurologic:  Normal speech and language. No gross focal neurologic deficits  are appreciated. Speech is normal.  Skin:  Skin is warm, dry and intact. No rash noted. Psychiatric: Mood and affect are normal. Speech and behavior are normal. Patient exhibits appropriate insight and judgment   ___________________________________________   LABS (all labs ordered are listed, but only abnormal results are displayed)  Labs Reviewed - No data to display ____________________________________________  PROCEDURES Procedures   INITIAL IMPRESSION / ASSESSMENT AND PLAN / ED COURSE  Pertinent labs & imaging results that were available during my care of the patient were reviewed by me and considered in my medical decision making (see chart for details).  Overall well-appearing patient.  No acute distress.  Suspect mechanical fall.  However patient reports a typical headache and woozy feeling, multiple morbidities and is on Eliquis.  Recommend further evaluation and head imaging in emergency room at this time.  Patient agrees this plan.  Patient states that her nephew will take her directly to [facility].  ____________________________________________   FINAL CLINICAL IMPRESSION(S) / ED DIAGNOSES  Final diagnoses:  Injury of head, initial encounter     ED Discharge Orders    None       Note: This dictation was prepared with Dragon dictation along with smaller phrase technology. Any transcriptional errors that result from this process are unintentional.         Marylene Land, NP 03/17/18 1708

## 2018-03-17 NOTE — ED Triage Notes (Signed)
Patient states that she fell in her yard couple of hours ago.  Patient states that she did hit her head in the mulch in her yard.  Patient c/o HAs.

## 2018-03-17 NOTE — ED Notes (Signed)
CT head and neck no new injury, to flex wait.

## 2018-03-17 NOTE — ED Notes (Signed)
Pt verbalized understanding of discharge instructions. NAD at this time. 

## 2018-03-17 NOTE — Discharge Instructions (Signed)
Please return to the ER for symptoms of concern.

## 2018-03-18 NOTE — ED Provider Notes (Signed)
[facility] Emergency Department Provider Note  ____________________________________________   None    (approximate)  I have reviewed the triage vital signs and the nursing notes.   HISTORY  Chief Complaint Fall   HPI [name] is a 84 y.o. female who presents to the emergency department for treatment and evaluation after mechanical, non-syncopal fall this morning at about 10:00.  She was opening gait and fell backward into a mulched flower bed.  She hit her head on the ground.  She is had a mild headache with some "wooziness."  She is currently on Eliquis.  At this time, she denies any other pain or symptoms of concern.  She was evaluated by urgent care and sent to the emergency department for CT.  Past Medical History:  Diagnosis Date  . Arthritis   . Asthma   . Atrial fibrillation ([place])   . Cataract   . Eczema   . GERD (gastroesophageal reflux disease)   . Gout of wrist   . Heart murmur   . Pacemaker   . Pseudogout of ankle or foot   . Thyroid disease   . TIA (transient ischemic attack)     Patient Active Problem List   Diagnosis Date Noted  . Leukocytosis 06/29/2016  . Hypogammaglobulinemia ([place]) 06/29/2016  . Temporary cerebral vascular dysfunction 02/28/2014  . DD (diverticular disease) 02/28/2014  . Allergic rhinitis 02/28/2014  . Allergy status to sulfonamides 02/28/2014  . History of anticoagulant therapy 02/28/2014  . Arthritis 02/28/2014  . Airway hyperreactivity 02/28/2014  . A-fib ([place]) 02/28/2014  . Bilateral cataracts 02/28/2014  . Dermatitis, eczematoid 02/28/2014  . Allergy to environmental factors 02/28/2014  . Acid reflux 02/28/2014  . Polypharmacy 02/28/2014  . HLD (hyperlipidemia) 02/28/2014  . Adult hypothyroidism 02/28/2014  . BP (high blood pressure) 02/28/2014  . Arthritis, degenerative 02/28/2014  . Degenerative arthritis of hip 10/09/2013  . 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 Neck rigid 05/30/2013  . 1st degree AV block 10/10/2012  . History of biliary T-tube placement 06/14/2012  . History of cardioversion 06/05/2012  . Avascular necrosis of femoral head ([place]) 06/01/2012  . Lumbar radiculopathy 06/01/2012  . DDD (degenerative disc disease), lumbar 06/01/2012  . H/O cardiac catheterization 10/20/2009    Past Surgical History:  Procedure Laterality Date  . ABLATION    . EYE SURGERY    . foot sugery    . HIP ARTHROPLASTY     right  . PACEMAKER INSERTION      Prior to Admission medications   Medication Sig Start Date End Date Taking? Authorizing Provider  acetaminophen (TYLENOL) 500 MG tablet Take 1,000 mg by mouth 2 (two) times daily as needed for mild pain or headache.     [provider]  albuterol (PROVENTIL HFA;VENTOLIN HFA) 108 (90 Base) MCG/ACT inhaler Inhale into the lungs. 04/15/11   [provider]  apixaban (ELIQUIS) 5 MG TABS tablet Take by mouth. 06/26/13   [provider]  aspirin EC 81 MG tablet Take 81 mg by mouth daily.    [provider]  atorvastatin (LIPITOR) 40 MG tablet Take 40 mg by mouth daily.  11/14/13   [provider]  budesonide-formoterol (SYMBICORT) 160-4.5 MCG/ACT inhaler Inhale 2 puffs into the lungs 2 (two) times daily.  09/09/13   [provider]  cholecalciferol (VITAMIN D) 1000 UNITS tablet Take 2,000 Units by mouth daily.    [provider]  Cholecalciferol (VITAMIN D-1000 MAX ST) 1000  UNITS tablet Take by mouth.    [provider]  clobetasol cream (TEMOVATE) 9.38 % Apply 1 application topically 2 (two) times daily.     [provider]  colchicine 0.6 MG tablet Take 0.6 mg by mouth daily.  01/20/14   [provider]  desonide (DESOWEN) 0.05 % ointment Apply 1 application topically 2 (two) times daily.    [provider]  diclofenac sodium (VOLTAREN) 1 % GEL Apply 2 grams bid prn 08/22/13   [provider]   fluticasone (FLONASE) 50 MCG/ACT nasal spray USE 2 SPRAYS IN EACH NOSTRIL DAILY 12/27/13   [provider]  HYDROcodone-acetaminophen (NORCO/VICODIN) 5-325 MG tablet Take 1 tablet by mouth every 6 (six) hours as needed for pain.    [provider]  hydrocortisone valerate cream (WESTCORT) 0.2 % Apply topically. 12/30/15   [provider]  ipratropium (ATROVENT) 0.06 % nasal spray Place 2 sprays into both nostrils 3 (three) times daily.  09/25/13   [provider]  levothyroxine (SYNTHROID, LEVOTHROID) 112 MCG tablet Take 112 mcg by mouth daily before breakfast.    [provider]  lidocaine (LIDODERM) 5 % Place 1 patch onto the skin every 12 (twelve) hours as needed.     [provider]  meclizine (ANTIVERT) 12.5 MG tablet Take 12.5 mg by mouth daily. 04/27/16   [provider]  montelukast (SINGULAIR) 10 MG tablet Take 10 mg by mouth at bedtime. TAKE 1 TABLET DAILY 12/27/13   [provider]  Olopatadine HCl (PAZEO) 0.7 % SOLN Apply 1 drop to eye daily.    [provider]  Omega-3 1000 MG CAPS Take by mouth.    [provider]  omeprazole (PRILOSEC) 20 MG capsule Take 20 mg by mouth daily.     [provider]  potassium chloride SA (KLOR-CON M20) 20 MEQ tablet Take 20 mEq by mouth daily.  01/20/14 06/29/16  [provider]  ranitidine (ZANTAC) 150 MG tablet Take 150 mg by mouth 2 (two) times daily.  07/19/13   [provider]  SYMBICORT 80-4.5 MCG/ACT inhaler  05/16/16   [provider]    Allergies Fluticasone-salmeterol; Albuterol; Ciprofloxacin; Codeine; Colchicine; Promethazine; Valacyclovir; Guaifenesin; and Sulfa antibiotics  Family History  Problem Relation Age of Onset  . Leukemia Father   . Lung cancer Sister   . Breast cancer Neg Hx     Social History Social History   Tobacco Use  . Smoking status: Never Smoker  . Smokeless tobacco: Never Used  Substance Use  Topics  . Alcohol use: Yes    Alcohol/week: 0.0 standard drinks  . Drug use: No    Review of Systems  Constitutional: No fever/chills Eyes: No visual changes. ENT: No sore throat. Cardiovascular: Denies chest pain. Respiratory: Denies shortness of breath. Gastrointestinal: No abdominal pain.  No nausea, no vomiting.  No diarrhea.  No constipation. Genitourinary: Negative for dysuria. Musculoskeletal: Negative for back pain. Skin: Negative for rash. Neurological: Negative for headaches, focal weakness or numbness. ____________________________________________   PHYSICAL EXAM:  VITAL SIGNS: ED Triage Vitals  Enc Vitals Group     BP 03/17/18 1329 (!) 181/92     Pulse Rate 03/17/18 1329 73     Resp 03/17/18 1329 18     Temp 03/17/18 1329 (!) 97.4 F (36.3 C)     Temp Source 03/17/18 1329 Oral     SpO2 03/17/18 1329 95 %     Weight 03/17/18 1330 144 lb (65.3 kg)  Height 03/17/18 1330 5\' 4"  (1.626 m)     Head Circumference --      Peak Flow --      Pain Score 03/17/18 1330 0     Pain Loc --      Pain Edu? --      Excl. in [place]? --     Constitutional: Alert and oriented. Well appearing and in no acute distress. Eyes: Conjunctivae are normal. PERRL. EOMI. Head: Atraumatic. Nose: No congestion/rhinnorhea. Mouth/Throat: Mucous membranes are moist.  Oropharynx non-erythematous. Neck: No stridor.   Cardiovascular: Normal rate, regular rhythm. Grossly normal heart sounds.  Good peripheral circulation. Respiratory: Normal respiratory effort.  No retractions. Lungs CTAB. Gastrointestinal: Soft and nontender. No distention. No abdominal bruits. No CVA tenderness. Musculoskeletal: No lower extremity tenderness nor edema.  No joint effusions. Neurologic:  Normal speech and language. No gross focal neurologic deficits are appreciated. No gait instability. Skin:  Skin is warm, dry and intact. No rash noted. Psychiatric: Mood and affect are normal. Speech and behavior are  normal.  ____________________________________________   LABS (all labs ordered are listed, but only abnormal results are displayed)  Labs Reviewed - No data to display ____________________________________________  EKG  Not indicated. ____________________________________________  RADIOLOGY  ED MD interpretation: CT of the head and cervical spine are negative for acute findings per radiology.  Official radiology report(s): No results found.  ____________________________________________   PROCEDURES  Procedure(s) performed: None  Procedures  Critical Care performed: No  ____________________________________________   INITIAL IMPRESSION / ASSESSMENT AND PLAN / ED COURSE  As part of my medical decision making, I reviewed the following data within the electronic MEDICAL RECORD NUMBER    84 year old female presenting to the emergency department for treatment and evaluation after mechanical, non-syncopal fall.  CT images of the head and cervical spine are negative for acute findings.  Patient will be discharged home and encouraged to return to the emergency department immediately if she begins to have increase in headache, unsteady gait, or other symptoms of concern.      ____________________________________________   FINAL CLINICAL IMPRESSION(S) / ED DIAGNOSES  Final diagnoses:  Minor head injury, initial encounter     ED Discharge Orders    None       Note:  This document was prepared using Dragon voice recognition software and may include unintentional dictation errors.    Victorino Dike, FNP 03/18/18 Min[place]eding    Lavonia Drafts, MD 03/19/18 1754

## 2018-05-15 ENCOUNTER — Other Ambulatory Visit: Payer: Self-pay

## 2018-05-15 ENCOUNTER — Encounter: Payer: Self-pay | Admitting: Emergency Medicine

## 2018-05-15 ENCOUNTER — Emergency Department: Payer: Medicare Other

## 2018-05-15 ENCOUNTER — Emergency Department
Admission: EM | Admit: 2018-05-15 | Discharge: 2018-05-15 | Disposition: A | Discharge status: Home / Self Care | Payer: Medicare Other | Attending: Student in an Organized Health Care Education/Training Program | Admitting: Student in an Organized Health Care Education/Training Program

## 2018-05-15 DIAGNOSIS — Z79899 Other long term (current) drug therapy: Secondary | ICD-10-CM | POA: Diagnosis not present

## 2018-05-15 DIAGNOSIS — Z7901 Long term (current) use of anticoagulants: Secondary | ICD-10-CM | POA: Insufficient documentation

## 2018-05-15 DIAGNOSIS — E079 Disorder of thyroid, unspecified: Secondary | ICD-10-CM | POA: Diagnosis not present

## 2018-05-15 DIAGNOSIS — R002 Palpitations: Secondary | ICD-10-CM | POA: Insufficient documentation

## 2018-05-15 DIAGNOSIS — Z7982 Long term (current) use of aspirin: Secondary | ICD-10-CM | POA: Diagnosis not present

## 2018-05-15 DIAGNOSIS — Z96641 Presence of right artificial hip joint: Secondary | ICD-10-CM | POA: Insufficient documentation

## 2018-05-15 DIAGNOSIS — J45909 Unspecified asthma, uncomplicated: Secondary | ICD-10-CM | POA: Insufficient documentation

## 2018-05-15 DIAGNOSIS — Z8673 Personal history of transient ischemic attack (TIA), and cerebral infarction without residual deficits: Secondary | ICD-10-CM | POA: Insufficient documentation

## 2018-05-15 DIAGNOSIS — I498 Other specified cardiac arrhythmias: Secondary | ICD-10-CM | POA: Diagnosis not present

## 2018-05-15 LAB — BASIC METABOLIC PANEL
Anion gap: 5 (ref 5–15)
BUN: 19 mg/dL (ref 8–23)
CO2: 30 mmol/L (ref 22–32)
Calcium: 9 mg/dL (ref 8.9–10.3)
Chloride: 103 mmol/L (ref 98–111)
Creatinine, Ser: 0.62 mg/dL (ref 0.44–1.00)
GFR calc Af Amer: >60 mL/min (ref >60)
GFR calc non Af Amer: >60 mL/min (ref >60)
Glucose, Bld: 94 mg/dL (ref 70–99)
Potassium: 3.8 mmol/L (ref 3.5–5.1)
Sodium: 138 mmol/L (ref 135–145)

## 2018-05-15 LAB — CBC
HCT: 45.4 % (ref 36.0–46.0)
Hemoglobin: 14.5 g/dL (ref 12.0–15.0)
MCH: 28.7 pg (ref 26.0–34.0)
MCHC: 31.9 g/dL (ref 30.0–36.0)
MCV: 89.7 fL (ref 80.0–100.0)
Platelets: 235 10*3/uL (ref 150–400)
RBC: 5.06 MIL/uL (ref 3.87–5.11)
RDW: 13.2 % (ref 11.5–15.5)
WBC: 7.8 10*3/uL (ref 4.0–10.5)
nRBC: 0 % (ref 0.0–0.2)

## 2018-05-15 MED ORDER — SODIUM CHLORIDE 0.9% FLUSH: 3.0000 mL | Freq: Once | INTRAVENOUS | Status: DC

## 2018-05-15 NOTE — ED Notes (Signed)
Sent rainbow to lab. Gave pt urine cup with bag for urine specimen.

## 2018-05-15 NOTE — ED Notes (Signed)
First Nurse Note: Patient complaining of feeling like her "heart is racing", taken to Triage 2 and EKG performed.

## 2018-05-15 NOTE — ED Notes (Signed)
Pt is a/ox4. Pt denies any pain at this time. Discharge instructions given top pt. Pt doesn't have any questions/concerns regarding discharge.  Family member in the room at the time of discharge.

## 2018-05-15 NOTE — ED Notes (Signed)
Patient states the sx she is experiencing began after her sister died in 13-Apr-2023.  Had pacemaker check last week.  BP 128/74 and HR 69 at this time.

## 2018-05-15 NOTE — ED Notes (Signed)
Pacemaker interrogated. 

## 2018-05-15 NOTE — ED Triage Notes (Signed)
Patient states she has been waking up between 3 and 5 AM with a "like a thumping" in my whole body, may whole body felt funny and it feels like pressure.  States it started last Friday and occurs each morning.  She takes her BP.  States sx resolve after approx and hour.  Here because "its recurring".  States she also feels dizzy when this happens.  Patient states she has a Chiropractor X 5 years.  Apical HR 74 and regular.

## 2018-05-15 NOTE — ED Provider Notes (Signed)
[facility] Emergency Department Provider Note    First MD Initiated Contact with Patient 05/15/18 1014     (approximate)  I have reviewed the triage vital signs and the nursing notes.   HISTORY  Chief Complaint Tachycardia    HPI [name] is a 85 y.o. female below listed past medical history presents the ER for evaluation of palpitations and thumping in her chest that occurs pretty dominantly at night around 3 AM and has happened over the past 3-4 nights.  Again had another episode this morning at 5 AM she came to the ER to be evaluated as she is worried about the increasing frequency.  She denies any chest pain.  No shortness of breath.  No palpitations at this time.  Does admit that she is quite stressed out dealing with the death of her sister in Mar 30, 2023 and is having to manage the estate finances which is stressing her out quite a bit.    Past Medical History:  Diagnosis Date  . Arthritis   . Asthma   . Atrial fibrillation ([place])   . Cataract   . Eczema   . GERD (gastroesophageal reflux disease)   . Gout of wrist   . Heart murmur   . Pacemaker   . Pseudogout of ankle or foot   . Thyroid disease   . TIA (transient ischemic attack)    Family History  Problem Relation Age of Onset  . Leukemia Father   . Lung cancer Sister   . Breast cancer Neg Hx    Past Surgical History:  Procedure Laterality Date  . ABLATION    . EYE SURGERY    . foot sugery    . HIP ARTHROPLASTY     right  . PACEMAKER INSERTION     Patient Active Problem List   Diagnosis Date Noted  . Leukocytosis 06/29/2016  . Hypogammaglobulinemia ([place]) 06/29/2016  . Temporary cerebral vascular dysfunction 02/28/2014  . DD (diverticular disease) 02/28/2014  . Allergic rhinitis 02/28/2014  . Allergy status to sulfonamides 02/28/2014  . History of anticoagulant therapy 02/28/2014  . Arthritis 02/28/2014  . Airway hyperreactivity 02/28/2014  . A-fib ([place])  02/28/2014  . Bilateral cataracts 02/28/2014  . Dermatitis, eczematoid 02/28/2014  . Allergy to environmental factors 02/28/2014  . Acid reflux 02/28/2014  . Polypharmacy 02/28/2014  . HLD (hyperlipidemia) 02/28/2014  . Adult hypothyroidism 02/28/2014  . BP (high blood pressure) 02/28/2014  . Arthritis, degenerative 02/28/2014  . Degenerative arthritis of hip 10/09/2013  . 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 Neck rigid 05/30/2013  . 1st degree AV block 10/10/2012  . History of biliary T-tube placement 06/14/2012  . History of cardioversion 06/05/2012  . Avascular necrosis of femoral head ([place]) 06/01/2012  . Lumbar radiculopathy 06/01/2012  . DDD (degenerative disc disease), lumbar 06/01/2012  . H/O cardiac catheterization 10/20/2009      Prior to Admission medications   Medication Sig Start Date End Date Taking? Authorizing Provider  acetaminophen (TYLENOL) 650 MG CR tablet Take 650 mg by mouth every 8 (eight) hours as needed for pain.   Yes [provider]  albuterol (PROVENTIL HFA;VENTOLIN HFA) 108 (90 Base) MCG/ACT inhaler Inhale 2 puffs into the lungs every 6 (six) hours as needed.  04/15/11  Yes [provider]  apixaban (ELIQUIS) 5 MG TABS tablet Take 5 mg by mouth 2 (two) times daily.  06/26/13  Yes [provider]  atorvastatin (LIPITOR) 40 MG tablet Take 40  mg by mouth daily.  11/14/13  Yes [provider]  budesonide-formoterol (SYMBICORT) 160-4.5 MCG/ACT inhaler Inhale 2 puffs into the lungs 2 (two) times daily.  09/09/13  Yes [provider]  cholecalciferol (VITAMIN D) 1000 UNITS tablet Take 1,000 Units by mouth daily.    Yes [provider]  clobetasol cream (TEMOVATE) 2.83 % Apply 1 application topically 2 (two) times daily.    Yes [provider]  desonide (DESOWEN) 0.05 % ointment Apply 1 application topically 2 (two) times daily.   Yes [provider]  levothyroxine  (SYNTHROID, LEVOTHROID) 112 MCG tablet Take 112 mcg by mouth daily before breakfast.   Yes [provider]  lidocaine (LIDODERM) 5 % Place 1 patch onto the skin every 12 (twelve) hours as needed.    Yes [provider]  montelukast (SINGULAIR) 10 MG tablet Take 10 mg by mouth at bedtime. TAKE 1 TABLET DAILY 12/27/13  Yes [provider]  Olopatadine HCl (PAZEO) 0.7 % SOLN Apply 1 drop to eye daily.   Yes [provider]  Omega-3 1000 MG CAPS Take 1,000 mg by mouth daily.    Yes [provider]  SYMBICORT 80-4.5 MCG/ACT inhaler Inhale 2 puffs into the lungs daily as needed.  05/16/16  Yes [provider]  acetaminophen (TYLENOL) 500 MG tablet Take 1,000 mg by mouth 2 (two) times daily as needed for mild pain or headache.     [provider]  aspirin EC 81 MG tablet Take 81 mg by mouth daily.    [provider]  Cholecalciferol (VITAMIN D-1000 MAX ST) 1000 UNITS tablet Take by mouth.    [provider]  colchicine 0.6 MG tablet Take 0.6 mg by mouth daily.  01/20/14   [provider]  diclofenac sodium (VOLTAREN) 1 % GEL Apply 2 grams bid prn 08/22/13   [provider]  fluticasone (FLONASE) 50 MCG/ACT nasal spray USE 2 SPRAYS IN EACH NOSTRIL DAILY 12/27/13   [provider]  HYDROcodone-acetaminophen (NORCO/VICODIN) 5-325 MG tablet Take 1 tablet by mouth every 6 (six) hours as needed for pain.    [provider]  hydrocortisone valerate cream (WESTCORT) 0.2 % Apply topically. 12/30/15   [provider]  ipratropium (ATROVENT) 0.06 % nasal spray Place 2 sprays into both nostrils 3 (three) times daily.  09/25/13   [provider]  meclizine (ANTIVERT) 12.5 MG tablet Take 12.5 mg by mouth daily. 04/27/16   [provider]  omeprazole (PRILOSEC) 20 MG capsule Take 20 mg by mouth daily.     [provider]  potassium chloride SA (KLOR-CON M20) 20 MEQ tablet Take 20 mEq  by mouth daily.  01/20/14 06/29/16  [provider]  ranitidine (ZANTAC) 150 MG tablet Take 150 mg by mouth 2 (two) times daily.  07/19/13   [provider]    Allergies Fluticasone-salmeterol; Albuterol; Ciprofloxacin; Codeine; Colchicine; Promethazine; Valacyclovir; Guaifenesin; and Sulfa antibiotics    Social History Social History   Tobacco Use  . Smoking status: Never Smoker  . Smokeless tobacco: Never Used  Substance Use Topics  . Alcohol use: Not Currently    Alcohol/week: 0.0 standard drinks  . Drug use: No    Review of Systems Patient denies headaches, rhinorrhea, blurry vision, numbness, shortness of breath, chest pain, edema, cough, abdominal pain, nausea, vomiting, diarrhea, dysuria, fevers, rashes or hallucinations unless otherwise stated above in HPI. ____________________________________________   PHYSICAL EXAM:  VITAL SIGNS: Vitals:   05/15/18 1130 05/15/18 1232  BP: 116/70 (!) 168/91  Pulse:  74  Resp: 18 20  Temp:    SpO2:  96%   Constitutional: Alert and oriented.  Eyes: Conjunctivae are normal.  Head: Atraumatic. Nose: No congestion/rhinnorhea. Mouth/Throat: Mucous membranes are moist.   Neck: No stridor. Painless ROM.  Cardiovascular: Normal rate, regular rhythm. Grossly normal heart sounds.  Good peripheral circulation. Respiratory: Normal respiratory effort.  No retractions. Lungs CTAB. Gastrointestinal: Soft and nontender. No distention. No abdominal bruits. No CVA tenderness. Genitourinary:  Musculoskeletal: No lower extremity tenderness nor edema.  No joint effusions. Neurologic:  Normal speech and language. No gross focal neurologic deficits are appreciated. No facial droop Skin:  Skin is warm, dry and intact. No rash noted. Psychiatric: Mood and affect are normal. Speech and behavior are normal.  ____________________________________________   LABS (all labs ordered are listed, but only abnormal results are  displayed)  Results for orders placed or performed during the hospital encounter of 05/15/18 (from the past 24 hour(s))  Basic metabolic panel     Status: None   Collection Time: 05/15/18  8:37 AM  Result Value Ref Range   Sodium 138 135 - 145 mmol/L   Potassium 3.8 3.5 - 5.1 mmol/L   Chloride 103 98 - 111 mmol/L   CO2 30 22 - 32 mmol/L   Glucose, Bld 94 70 - 99 mg/dL   BUN 19 8 - 23 mg/dL   Creatinine, Ser 0.62 0.44 - 1.00 mg/dL   Calcium 9.0 8.9 - 10.3 mg/dL   GFR calc non Af Amer >60 >60 mL/min   GFR calc Af Amer >60 >60 mL/min   Anion gap 5 5 - 15  CBC     Status: None   Collection Time: 05/15/18  8:37 AM  Result Value Ref Range   WBC 7.8 4.0 - 10.5 K/uL   RBC 5.06 3.87 - 5.11 MIL/uL   Hemoglobin 14.5 12.0 - 15.0 g/dL   HCT 45.4 36.0 - 46.0 %   MCV 89.7 80.0 - 100.0 fL   MCH 28.7 26.0 - 34.0 pg   MCHC 31.9 30.0 - 36.0 g/dL   RDW 13.2 11.5 - 15.5 %   Platelets 235 150 - 400 K/uL   nRBC 0.0 0.0 - 0.2 %   ____________________________________________  EKG My review and personal interpretation at Time: 8:29   Indication: palpitations  Rate: 80  Rhythm: v paced Axis: left Other: abn ekg ____________________________________________  RADIOLOGY  I personally reviewed all radiographic images ordered to evaluate for the above acute complaints and reviewed radiology reports and findings.  These findings were personally discussed with the patient.  Please see medical record for radiology report.  ____________________________________________   PROCEDURES  Procedure(s) performed:  Procedures    Critical Care performed: no ____________________________________________   INITIAL IMPRESSION / ASSESSMENT AND PLAN / ED COURSE  Pertinent labs & imaging results that were available during my care of the patient were reviewed by me and considered in my medical decision making (see chart for details).   DDX: Dysrhythmia, electrolyte abnormality, congestive heart failure,  anxiety, panic  [name] is a 85 y.o. who presents to the ED with intermittent symptoms as described above.  Patient nontoxic-appearing afebrile and hemodynamically stable.  Well-appearing.  Does admit to quite a bit of stressors as listed above.  EKG stable.  Given her reported palpitations will interrogate pacer.  Does not seem clinically consistent with heart failure ACS, PE or dissection.  The patient will be placed on continuous  pulse oximetry and telemetry for monitoring.  Laboratory evaluation will be sent to evaluate for the above complaints.     Clinical Course as of May 16 1503  Tue May 15, 2018  1147 Pacer interrogation shows no evidence of dysrhythmia.  She denies any pain.  This not consistent with ACS dissection or PE.  Patient stable and appropriate for outpatient follow-up.  Have discussed with the patient and available family all diagnostics and treatments performed thus far and all questions were answered to the best of my ability. The patient demonstrates understanding and agreement with plan.    [PR]    Clinical Course User Index [PR] Merlyn Lot, MD     As part of my medical decision making, I reviewed the following data within the [place] notes reviewed and incorporated, Labs reviewed, notes from prior ED visits   ____________________________________________   FINAL CLINICAL IMPRESSION(S) / ED DIAGNOSES  Final diagnoses:  Palpitations      NEW MEDICATIONS STARTED DURING THIS VISIT:  Discharge Medication List as of 05/15/2018 11:50 AM       Note:  This document was prepared using Dragon voice recognition software and may include unintentional dictation errors.    Merlyn Lot, MD 05/15/18 [phone]

## 2018-05-15 NOTE — ED Notes (Signed)
Pt states she felt a light dizziness this morning; pt states  her BP this morning was "high". Pt states she felt like something was wrong and her heart was going fast" Pt denies CP or SOB. NAD noted at this time.

## 2018-09-18 ENCOUNTER — Other Ambulatory Visit: Payer: Self-pay | Admitting: Podiatry

## 2018-09-18 ENCOUNTER — Other Ambulatory Visit (HOSPITAL_COMMUNITY): Payer: Self-pay | Admitting: Podiatry

## 2018-09-18 DIAGNOSIS — M25571 Pain in right ankle and joints of right foot: Secondary | ICD-10-CM

## 2018-09-18 DIAGNOSIS — M79671 Pain in right foot: Secondary | ICD-10-CM

## 2018-09-20 ENCOUNTER — Other Ambulatory Visit: Payer: Self-pay

## 2018-09-20 ENCOUNTER — Ambulatory Visit
Admission: RE | Admit: 2018-09-20 | Discharge: 2018-09-20 | Disposition: A | Discharge status: Home / Self Care | Payer: Medicare Other | Source: Ambulatory Visit | Attending: Podiatry | Admitting: Podiatry

## 2018-09-20 DIAGNOSIS — M79671 Pain in right foot: Secondary | ICD-10-CM | POA: Insufficient documentation

## 2018-09-20 DIAGNOSIS — M25571 Pain in right ankle and joints of right foot: Secondary | ICD-10-CM | POA: Diagnosis present

## 2019-01-11 ENCOUNTER — Other Ambulatory Visit: Payer: Self-pay

## 2019-01-11 ENCOUNTER — Emergency Department
Admission: EM | Admit: 2019-01-11 | Discharge: 2019-01-11 | Disposition: A | Discharge status: Home / Self Care | Payer: Medicare Other | Attending: Emergency Medicine | Admitting: Emergency Medicine

## 2019-01-11 ENCOUNTER — Emergency Department: Payer: Medicare Other

## 2019-01-11 DIAGNOSIS — Z950 Presence of cardiac pacemaker: Secondary | ICD-10-CM | POA: Insufficient documentation

## 2019-01-11 DIAGNOSIS — Z79899 Other long term (current) drug therapy: Secondary | ICD-10-CM | POA: Insufficient documentation

## 2019-01-11 DIAGNOSIS — I4891 Unspecified atrial fibrillation: Secondary | ICD-10-CM | POA: Diagnosis not present

## 2019-01-11 DIAGNOSIS — Z7982 Long term (current) use of aspirin: Secondary | ICD-10-CM | POA: Insufficient documentation

## 2019-01-11 DIAGNOSIS — J45909 Unspecified asthma, uncomplicated: Secondary | ICD-10-CM | POA: Diagnosis not present

## 2019-01-11 DIAGNOSIS — R079 Chest pain, unspecified: Secondary | ICD-10-CM | POA: Insufficient documentation

## 2019-01-11 DIAGNOSIS — E039 Hypothyroidism, unspecified: Secondary | ICD-10-CM | POA: Insufficient documentation

## 2019-01-11 DIAGNOSIS — Z7901 Long term (current) use of anticoagulants: Secondary | ICD-10-CM | POA: Diagnosis not present

## 2019-01-11 LAB — BASIC METABOLIC PANEL
Anion gap: 9 (ref 5–15)
BUN: 13 mg/dL (ref 8–23)
CO2: 27 mmol/L (ref 22–32)
Calcium: 9 mg/dL (ref 8.9–10.3)
Chloride: 99 mmol/L (ref 98–111)
Creatinine, Ser: 0.51 mg/dL (ref 0.44–1.00)
GFR calc Af Amer: >60 mL/min (ref >60)
GFR calc non Af Amer: >60 mL/min (ref >60)
Glucose, Bld: 100 mg/dL — ABNORMAL HIGH (ref 70–99)
Potassium: 4.3 mmol/L (ref 3.5–5.1)
Sodium: 135 mmol/L (ref 135–145)

## 2019-01-11 LAB — CBC
HCT: 43.1 % (ref 36.0–46.0)
Hemoglobin: 13.7 g/dL (ref 12.0–15.0)
MCH: 28.1 pg (ref 26.0–34.0)
MCHC: 31.8 g/dL (ref 30.0–36.0)
MCV: 88.3 fL (ref 80.0–100.0)
Platelets: 241 10*3/uL (ref 150–400)
RBC: 4.88 MIL/uL (ref 3.87–5.11)
RDW: 13 % (ref 11.5–15.5)
WBC: 8.1 10*3/uL (ref 4.0–10.5)
nRBC: 0 % (ref 0.0–0.2)

## 2019-01-11 LAB — TROPONIN I (HIGH SENSITIVITY)
Troponin I (High Sensitivity): 5 ng/L (ref <18)
Troponin I (High Sensitivity): 5 ng/L (ref <18)

## 2019-01-11 MED ORDER — SODIUM CHLORIDE 0.9% FLUSH: 3.0000 mL | Freq: Once | INTRAVENOUS | Status: DC

## 2019-01-11 NOTE — ED Triage Notes (Signed)
Pt states sometime after midnight she woke up with left sided chest pain radiating into her left arm. States it is better, now just having the pain with deep breathing, states it just doesn't feel normal, now feeling weak and dizzy.

## 2019-01-11 NOTE — ED Notes (Signed)
Pt nephew Lorraine Lax called per pt request and will be coming to room to visit with pt

## 2019-01-11 NOTE — ED Notes (Signed)
Pt is agitated that she has had to wait today and is complaining that she has been here to long - she states that she is going to complain on the ED - Dr Charna Archer is at bedside trying to convince her to stay for 2nd troponin and pt is argumentative and not wanting to stay for the results - pt advised of concern about her leaving without 2nd troponin - pt keeps arguing that she does not have a choice - she is advised that she does have a choice as to stay or leave - at this time she is willing to stay for labs but still very argumentative

## 2019-01-11 NOTE — ED Provider Notes (Signed)
-----------------------------------------   3:06 PM on 01/11/2019 -----------------------------------------  Blood pressure (!) 145/82, pulse 69, temperature 97.6 F (36.4 C), temperature source Oral, resp. rate (!) 24, height 5\' 3"  (1.6 m), weight 63.5 kg, SpO2 96 %.  Assuming care from Dr. Joni Fears.  In short, Ursala Kubit is a 84 y.o. female with a chief complaint of Chest Pain .  Refer to the original H&P for additional details.  The current plan of care is to recheck troponin, if negative plan for d/c home given atypical chest pain.  Repeat troponin within normal limits, patient appropriate for discharge home with PCP follow-up.  Counseled patient to return to the ED for new or worsening symptoms, patient agrees with plan.    Blake Divine, MD 01/11/19 [phone]

## 2019-01-11 NOTE — ED Notes (Signed)
Pt refuses vitals for discharge

## 2019-01-11 NOTE — ED Notes (Signed)
Pt refuses to be connected back to monitors and has gotten up and dressed herself - she is still argumentative and not wanting to stay - lab drawn

## 2019-01-11 NOTE — ED Notes (Signed)
Pt called out and ready to go home. Will let MD know

## 2019-01-11 NOTE — ED Provider Notes (Signed)
[facility] Emergency Department Provider Note  ____________________________________________  Time seen: Approximately 3:42 PM  I have reviewed the triage vital signs and the nursing notes.   HISTORY  Chief Complaint Chest Pain    HPI [name] is a 85 y.o. female with a history of atrial fibrillation GERD pacemaker and gout  who comes the ED complaining of left-sided chest pain that started about 3 AM today, resolved around 6 AM.  Described as tightness, radiating to her left arm.  Worse with movement of the left arm.  No alleviating factors.  No associated diaphoresis vomiting or shortness of breath, not exertional.  Seem to go away after she got up at 5 AM and started walking around and ate breakfast.  She does have a history of GERD for which she takes Pepcid twice daily.  No pleuritic symptoms, no recent illness fevers chills or body aches.     Past Medical History:  Diagnosis Date  . Arthritis   . Asthma   . Atrial fibrillation ([place])   . Cataract   . Eczema   . GERD (gastroesophageal reflux disease)   . Gout of wrist   . Heart murmur   . Pacemaker   . Pseudogout of ankle or foot   . Thyroid disease   . TIA (transient ischemic attack)      Patient Active Problem List   Diagnosis Date Noted  . Leukocytosis 06/29/2016  . Hypogammaglobulinemia ([place]) 06/29/2016  . Temporary cerebral vascular dysfunction 02/28/2014  . DD (diverticular disease) 02/28/2014  . Allergic rhinitis 02/28/2014  . Allergy status to sulfonamides 02/28/2014  . History of anticoagulant therapy 02/28/2014  . Arthritis 02/28/2014  . Airway hyperreactivity 02/28/2014  . A-fib ([place]) 02/28/2014  . Bilateral cataracts 02/28/2014  . Dermatitis, eczematoid 02/28/2014  . Allergy to environmental factors 02/28/2014  . Acid reflux 02/28/2014  . Polypharmacy 02/28/2014  . HLD (hyperlipidemia) 02/28/2014  . Adult hypothyroidism 02/28/2014  . BP (high blood pressure)  02/28/2014  . Arthritis, degenerative 02/28/2014  . Degenerative arthritis of hip 10/09/2013  . 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 Neck rigid 05/30/2013  . 1st degree AV block 10/10/2012  . History of biliary T-tube placement 06/14/2012  . History of cardioversion 06/05/2012  . Avascular necrosis of femoral head ([place]) 06/01/2012  . Lumbar radiculopathy 06/01/2012  . DDD (degenerative disc disease), lumbar 06/01/2012  . H/O cardiac catheterization 10/20/2009     Past Surgical History:  Procedure Laterality Date  . ABLATION    . EYE SURGERY    . foot sugery    . HIP ARTHROPLASTY     right  . PACEMAKER INSERTION       Prior to Admission medications   Medication Sig Start Date End Date Taking? Authorizing Provider  apixaban (ELIQUIS) 5 MG TABS tablet Take 5 mg by mouth 2 (two) times daily.  06/26/13  Yes [provider]  atorvastatin (LIPITOR) 40 MG tablet Take 40 mg by mouth daily.  11/14/13  Yes [provider]  cholecalciferol (VITAMIN D) 1000 UNITS tablet Take 1,000 Units by mouth daily.    Yes [provider]  levothyroxine (SYNTHROID, LEVOTHROID) 112 MCG tablet Take 112 mcg by mouth daily before breakfast.   Yes [provider]  metoprolol succinate (TOPROL-XL) 25 MG 24 hr tablet Take 25 mg by mouth 2 (two) times daily. 05/17/18  Yes [provider]  montelukast (SINGULAIR) 10 MG tablet Take 10 mg by mouth at bedtime. TAKE  1 TABLET DAILY 12/27/13  Yes [provider]  Olopatadine HCl (PAZEO) 0.7 % SOLN Apply 1 drop to eye daily.   Yes [provider]  Omega-3 1000 MG CAPS Take 1,000 mg by mouth daily.    Yes [provider]  acetaminophen (TYLENOL) 500 MG tablet Take 1,000 mg by mouth 2 (two) times daily as needed for mild pain or headache.     [provider]  acetaminophen (TYLENOL) 650 MG CR tablet Take 650 mg by mouth every 8 (eight) hours as needed for pain.    [provider]  albuterol (PROVENTIL HFA;VENTOLIN HFA) 108 (90 Base) MCG/ACT inhaler Inhale 2 puffs into the lungs every 6 (six) hours as needed.  04/15/11   [provider]  aspirin EC 81 MG tablet Take 81 mg by mouth daily.    [provider]  budesonide-formoterol (SYMBICORT) 160-4.5 MCG/ACT inhaler Inhale 2 puffs into the lungs 2 (two) times daily.  09/09/13   [provider]  Cholecalciferol (VITAMIN D-1000 MAX ST) 1000 UNITS tablet Take by mouth.    [provider]  clobetasol cream (TEMOVATE) [id number]-123456789 % Apply 1 application topically 2 (two) times daily.     [provider]  colchicine 0.6 MG tablet Take 0.6 mg by mouth daily.  01/20/14   [provider]  desonide (DESOWEN) 0.05 % ointment Apply 1 application topically 2 (two) times daily.    [provider]  diclofenac sodium (VOLTAREN) 1 % GEL Apply 2 grams bid prn 08/22/13   [provider]  fluticasone (FLONASE) 50 MCG/ACT nasal spray USE 2 SPRAYS IN EACH NOSTRIL DAILY 12/27/13   [provider]  HYDROcodone-acetaminophen (NORCO/VICODIN) 5-325 MG tablet Take 1 tablet by mouth every 6 (six) hours as needed for pain.    [provider]  hydrocortisone valerate cream (WESTCORT) 0.2 % Apply topically. 12/30/15   [provider]  ipratropium (ATROVENT) 0.06 % nasal spray Place 2 sprays into both nostrils 3 (three) times daily.  09/25/13   [provider]  lidocaine (LIDODERM) 5 % Place 1 patch onto the skin every 12 (twelve) hours as needed.     [provider]  meclizine (ANTIVERT) 12.5 MG tablet Take 12.5 mg by mouth daily. 04/27/16   [provider]  omeprazole (PRILOSEC) 20 MG capsule Take 20 mg by mouth daily.     [provider]  potassium chloride SA (KLOR-CON M20) 20 MEQ tablet Take 20 mEq by mouth daily.  01/20/14 06/29/16  [provider]  SYMBICORT 80-4.5 MCG/ACT inhaler Inhale 2 puffs into the lungs daily as  needed.  05/16/16   [provider]     Allergies Fluticasone-salmeterol, Albuterol, Ciprofloxacin, Codeine, Colchicine, Promethazine, Valacyclovir, Guaifenesin, and Sulfa antibiotics   Family History  Problem Relation Age of Onset  . Leukemia Father   . Lung cancer Sister   . Breast cancer Neg Hx     Social History Social History   Tobacco Use  . Smoking status: Never Smoker  . Smokeless tobacco: Never Used  Substance Use Topics  . Alcohol use: Not Currently    Alcohol/week: 0.0 standard drinks  . Drug use: No    Review of Systems  Constitutional:   No fever or chills.  ENT:   No sore throat. No rhinorrhea. Cardiovascular:   Positive as above chest pain without syncope. Respiratory:   No dyspnea or cough. Gastrointestinal:   Negative for abdominal pain, vomiting and diarrhea.  Musculoskeletal:   Negative  for focal pain or swelling All other systems reviewed and are negative except as documented above in ROS and HPI.  ____________________________________________   PHYSICAL EXAM:  VITAL SIGNS: ED Triage Vitals  Enc Vitals Group     BP 01/11/19 1045 133/66     Pulse Rate 01/11/19 1045 71     Resp 01/11/19 1045 18     Temp 01/11/19 1045 97.6 F (36.4 C)     Temp Source 01/11/19 1045 Oral     SpO2 01/11/19 1045 97 %     Weight 01/11/19 1048 140 lb (63.5 kg)     Height 01/11/19 1048 5\' 3"  (1.6 m)     Head Circumference --      Peak Flow --      Pain Score 01/11/19 1048 0     Pain Loc --      Pain Edu? --      Excl. in [place]? --     Vital signs reviewed, nursing assessments reviewed.   Constitutional:   Alert and oriented. Non-toxic appearance. Eyes:   Conjunctivae are normal. EOMI. PERRL. ENT      Head:   Normocephalic and atraumatic.          Neck:   No meningismus. Full ROM. Hematological/Lymphatic/Immunilogical:   No cervical lymphadenopathy. Cardiovascular:   RRR. Symmetric bilateral radial and DP pulses.  No murmurs. Cap refill less than 2  seconds. Respiratory:   Normal respiratory effort without tachypnea/retractions. Breath sounds are clear and equal bilaterally. No wheezes/rales/rhonchi. Gastrointestinal:   Soft and nontender. Non distended. There is no CVA tenderness.  No rebound, rigidity, or guarding. Musculoskeletal:   Normal range of motion in all extremities. No joint effusions.  No lower extremity tenderness.  No edema.  Chest wall nontender Neurologic:   Normal speech and language.  Motor grossly intact. No acute focal neurologic deficits are appreciated.  Skin:    Skin is warm, dry and intact. No rash noted.  No petechiae, purpura, or bullae.  ____________________________________________    LABS (pertinent positives/negatives) (all labs ordered are listed, but only abnormal results are displayed) Labs Reviewed  BASIC METABOLIC PANEL - Abnormal; Notable for the following components:      Result Value   Glucose, Bld 100 (*)    All other components within normal limits  CBC  TROPONIN I (HIGH SENSITIVITY)  TROPONIN I (HIGH SENSITIVITY)   ____________________________________________   EKG  Interpreted by me Ventricular paced rhythm, rate of 71, left axis, right bundle branch block, no acute ischemic changes.  ____________________________________________    RADIOLOGY  Dg Chest 2 View  Result Date: 01/11/2019 CLINICAL DATA:  Chest pain EXAM: CHEST - 2 VIEW COMPARISON:  05/15/2018 FINDINGS: Biventricular pacer with stable looping of the left ventricular lead. Generous lung volumes. There is no edema, consolidation, effusion, or pneumothorax. Mitral annular calcification with normal heart size and mild aortic tortuosity. No acute osseous finding. Remote proximal right humerus fracture. IMPRESSION: No evidence of active disease. Electronically Signed   By: Monte Fantasia M.D.   On: 01/11/2019 11:41     ____________________________________________   PROCEDURES Procedures  ____________________________________________  DIFFERENTIAL DIAGNOSIS   GERD, esophageal spasm, non-STEMI  CLINICAL IMPRESSION / ASSESSMENT AND PLAN / ED COURSE  Medications ordered in the ED: Medications  sodium chloride flush (NS) 0.9 % injection 3 mL (has no administration in time range)    Pertinent labs & imaging results that were available during my care of the patient were reviewed by me and considered  in my medical decision making (see chart for details).  [name] was evaluated in Emergency Department on 01/11/2019 for the symptoms described in the history of present illness. She was evaluated in the context of the global COVID-19 pandemic, which necessitated consideration that the patient might be at risk for infection with the SARS-CoV-2 virus that causes COVID-19. Institutional protocols and algorithms that pertain to the evaluation of patients at risk for COVID-19 are in a state of rapid change based on information released by regulatory bodies including the CDC and federal and state organizations. These policies and algorithms were followed during the patient's care in the ED.   Patient presents with nonspecific chest pain.  Vital signs are normal, EKG is unremarkable, paced rhythm.  Symptoms have been resolved since 6 AM today.  Initial chest x-ray and labs are unremarkable.  Advised patient that we will get a second troponin and if remains reassuring that she will be able to go home, add Maalox for next few days and follow-up with her doctor.  She is on Eliquis as well which should lower risk of any acute vascular event.  She has an appointment from cardiology to have her pacemaker interrogated in the next week.     ____________________________________________   FINAL CLINICAL IMPRESSION(S) / ED DIAGNOSES    Final diagnoses:  Nonspecific chest pain     ED Discharge Orders     None      Portions of this note were generated with dragon dictation software. Dictation errors may occur despite best attempts at proofreading.   Carrie Mew, MD 01/11/19 [phone]

## 2019-03-28 ENCOUNTER — Ambulatory Visit: Payer: Medicare Other | Admitting: Physical Therapy

## 2019-04-02 ENCOUNTER — Encounter: Payer: Self-pay | Admitting: Physical Therapy

## 2019-04-02 ENCOUNTER — Other Ambulatory Visit: Payer: Self-pay

## 2019-04-02 ENCOUNTER — Ambulatory Visit: Payer: Medicare Other | Attending: Neurology | Admitting: Physical Therapy

## 2019-04-02 DIAGNOSIS — R2689 Other abnormalities of gait and mobility: Secondary | ICD-10-CM | POA: Insufficient documentation

## 2019-04-02 DIAGNOSIS — M6281 Muscle weakness (generalized): Secondary | ICD-10-CM | POA: Diagnosis present

## 2019-04-02 DIAGNOSIS — R269 Unspecified abnormalities of gait and mobility: Secondary | ICD-10-CM | POA: Insufficient documentation

## 2019-04-04 ENCOUNTER — Encounter: Payer: Medicare Other | Admitting: Physical Therapy

## 2019-04-04 ENCOUNTER — Ambulatory Visit: Payer: Medicare Other | Admitting: Physical Therapy

## 2019-04-04 ENCOUNTER — Other Ambulatory Visit: Payer: Self-pay

## 2019-04-04 DIAGNOSIS — R269 Unspecified abnormalities of gait and mobility: Secondary | ICD-10-CM

## 2019-04-04 DIAGNOSIS — M6281 Muscle weakness (generalized): Secondary | ICD-10-CM

## 2019-04-04 DIAGNOSIS — R2689 Other abnormalities of gait and mobility: Secondary | ICD-10-CM

## 2019-04-06 NOTE — Therapy (Signed)
[facility] [address] [place], [place], [address] Phone: [phone]   Fax:  [phone]  Physical Therapy Evaluation  Patient Details  Name: [name] MRN: [id number] Date of Birth: [birth year] Referring Provider (PT): Dr. Melrose Nakayama   Encounter Date: 04/02/2019  PT End of Session - 04/06/19 1150    Visit Number  1    Number of Visits  8    Date for PT Re-Evaluation  04/30/19    Authorization - Visit Number  1    Authorization - Number of Visits  10    PT Start Time  1601    PT Stop Time  1655    PT Time Calculation (min)  54 min    Equipment Utilized During Treatment  Gait belt    Activity Tolerance  Patient tolerated treatment well    Behavior During Therapy  [facility] for tasks assessed/performed       Past Medical History:  Diagnosis Date  . Arthritis   . Asthma   . Atrial fibrillation ([place])   . Cataract   . Eczema   . GERD (gastroesophageal reflux disease)   . Gout of wrist   . Heart murmur   . Pacemaker   . Pseudogout of ankle or foot   . Thyroid disease   . TIA (transient ischemic attack)     Past Surgical History:  Procedure Laterality Date  . ABLATION    . EYE SURGERY    . foot sugery    . HIP ARTHROPLASTY     right  . PACEMAKER INSERTION      There were no vitals filed for this visit.   Subjective Assessment - 04/06/19 1146    Subjective  Pt. states she has been having several recent episodes of LOB.  Pt. reports she was at Cardiologist and turned quickly resulting in dizziness and near fall.  Pt. has a SPC and rollator at home.  Pt. states she tends to fall backwards and has fallen too many times in past year (no specific number reported).    Patient is accompained by:  Family member    Limitations  Lifting;Standing;Walking;House hold activities    Patient Stated Goals  Improve balance/ gait/ decrease fall risk.    Currently in Pain?  No/denies         [facility] PT Assessment - 04/06/19  0001      Assessment   Medical Diagnosis  Unsteady gait/ Sensory Ataxia    Referring Provider (PT)  Dr. Melrose Nakayama    Onset Date/Surgical Date  11/23/16    Prior Therapy  yes      Precautions   Precautions  Fall      Balance Screen   Has the patient fallen in the past 6 months  Yes    How many times?  numerous      [place] residence      Prior Function   Level of Independence  Independent      Cognition   Overall Cognitive Status  Within Functional Limits for tasks assessed    Attention  Focused        B LE muscle strength grossly 5/5 MMT except hip flexion 4/5 MMT  see Berg balance (UE assist with transfers)   Objective measurements completed on examination: See above findings.       Gait training: amb. In clinic with instruction to maintain consistent BOSU/ heel strike while walking with [facility]  in hallway.  PT sized SPC for proper height.     PT Education - 04/06/19 1150    Education Details  Discussed fall risk/ importance of using rollator.    Person(s) Educated  Patient    Methods  Explanation;Demonstration    Comprehension  Verbalized understanding;Returned demonstration          PT Long Term Goals - 04/06/19 1203      PT LONG TERM GOAL #1   Title  Pt. will increase FOTO to 60 to improve functional mobility.    Baseline  Initial FOTO: 57.    Time  4    Period  Weeks    Status  New    Target Date  04/30/19      PT LONG TERM GOAL #2   Title  Pt. will increase B LE/hip flexion strength 1/2 muscle grade to improve gait/ balance/ safety.    Baseline  B LE muscle strength grossly 5/5 MMT except hip flexion 4/5 MMT (previous R hip surgery)    Time  4    Period  Weeks    Status  New    Target Date  04/30/19      PT LONG TERM GOAL #3   Title  Pt. will increase Berg balance test to >45/56 to promote safety/ decrease fall risk.    Baseline  Berg balance test:  39/56 (signifcant fall risk)    Time  4    Period  Weeks     Status  New    Target Date  04/30/19      PT LONG TERM GOAL #4   Title  Pt. will demonstrate consistent 2-point gait pattern with use of SPC to decrease fall risk/ promote safety.    Baseline  Pt. ambulates with inconsistent gait pattern with/ without use of SPC    Time  4    Period  Weeks    Status  New    Target Date  04/30/19          Plan - 04/06/19 1151    Clinical Impression Statement  Pt. is a pleasant 85 y/o with unstable gait and sensory ataxia.  Pt. reports several recent falls and numerous falls in past.  Pt. currently ambulates with inconsistent use of SPC and has a rollator at home.  Pt. reports no pain and presents with functional UE/LE AROM.  B LE muscle strength grossly 5/5 MMT except hip flexion 4/5 MMT.  Pt. ambulates with ataxic gait pattern with occasional sway with use of SPC in clinic.  Pt. requires extra time to stand to prevent dizziness/ LOB.  Berg balance test: 39/56 (significant fall risk).  Pt. requires UE assist with all aspects of standing/ transfers/ turning.  FOTO: initial 57/ goal 60.  Pt. will benefit from skilled PT services to increase LE muscle strength to improve gait/ balance and decrease fall risk.    Stability/Clinical Decision Making  Evolving/Moderate complexity    Clinical Decision Making  Moderate    Rehab Potential  Fair    PT Frequency  2x / week    PT Duration  4 weeks    PT Treatment/Interventions  ADLs/Self Care Home Management;Gait training;Stair training;Functional mobility training;Neuromuscular re-education;Balance training;Therapeutic exercise;Therapeutic activities;Patient/family education;Manual techniques    PT Next Visit Plan  Issue HEP       Patient will benefit from skilled therapeutic intervention in order to improve the following deficits and impairments:  Abnormal gait, Decreased balance, Decreased endurance, Decreased mobility, Difficulty walking, Dizziness,  Decreased activity tolerance, Decreased coordination, Decreased  strength  Visit Diagnosis: Gait difficulty  Muscle weakness (generalized)  Balance problems     Problem List Patient Active Problem List   Diagnosis Date Noted  . Leukocytosis 06/29/2016  . Hypogammaglobulinemia ([place]) 06/29/2016  . Temporary cerebral vascular dysfunction 02/28/2014  . DD (diverticular disease) 02/28/2014  . Allergic rhinitis 02/28/2014  . Allergy status to sulfonamides 02/28/2014  . History of anticoagulant therapy 02/28/2014  . Arthritis 02/28/2014  . Airway hyperreactivity 02/28/2014  . A-fib ([place]) 02/28/2014  . Bilateral cataracts 02/28/2014  . Dermatitis, eczematoid 02/28/2014  . Allergy to environmental factors 02/28/2014  . Acid reflux 02/28/2014  . Polypharmacy 02/28/2014  . HLD (hyperlipidemia) 02/28/2014  . Adult hypothyroidism 02/28/2014  . BP (high blood pressure) 02/28/2014  . Arthritis, degenerative 02/28/2014  . Degenerative arthritis of hip 10/09/2013  . 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 Neck rigid 05/30/2013  . 1st degree AV block 10/10/2012  . History of biliary T-tube placement 06/14/2012  . History of cardioversion 06/05/2012  . Avascular necrosis of femoral head ([place]) 06/01/2012  . Lumbar radiculopathy 06/01/2012  . DDD (degenerative disc disease), lumbar 06/01/2012  . H/O cardiac catheterization 10/20/2009   Pura Spice, PT, DPT # [id number] 04/06/2019, 12:08 PM  [facility] [address] [place], [place], [address] Phone: [phone]   Fax:  [phone]  Name: [name] MRN: [id number] Date of Birth: 1924-05-31

## 2019-04-07 ENCOUNTER — Encounter: Payer: Self-pay | Admitting: Physical Therapy

## 2019-04-07 NOTE — Therapy (Signed)
[place] [facility] [address] [place], [place], [address] Phone: [phone]   Fax:  [phone]  Physical Therapy Treatment  Patient Details  Name: [name] MRN: [id number] Date of Birth: 1924/12/16 Referring Provider (PT): Dr. Melrose Nakayama   Encounter Date: 04/04/2019  PT End of Session - 04/07/19 1320    Visit Number  2    Number of Visits  8    Date for PT Re-Evaluation  04/30/19    Authorization - Visit Number  2    Authorization - Number of Visits  10    PT Start Time  [id number]    PT Stop Time  1556    PT Time Calculation (min)  49 min    Equipment Utilized During Treatment  Gait belt    Activity Tolerance  Patient tolerated treatment well    Behavior During Therapy  [facility] for tasks assessed/performed       Past Medical History:  Diagnosis Date  . Arthritis   . Asthma   . Atrial fibrillation ([place])   . Cataract   . Eczema   . GERD (gastroesophageal reflux disease)   . Gout of wrist   . Heart murmur   . Pacemaker   . Pseudogout of ankle or foot   . Thyroid disease   . TIA (transient ischemic attack)     Past Surgical History:  Procedure Laterality Date  . ABLATION    . EYE SURGERY    . foot sugery    . HIP ARTHROPLASTY     right  . PACEMAKER INSERTION      There were no vitals filed for this visit.  Subjective Assessment - 04/07/19 1308    Subjective  Pt. entered PT with use of SPC.  Pt. states she is having some balance issues but no falls since initial evaluation.    Patient is accompained by:  Family member    Limitations  Lifting;Standing;Walking;House hold activities    Patient Stated Goals  Improve balance/ gait/ decrease fall risk.    Currently in Pain?  No/denies         There.ex.:  Nustep L3 B UE/LE 10 min. (consistent cadence) Walking in //-bars forward with increase hip flexion/ lateral walking with light UE assist 10x each.   Sit to stands from green chair with posture correction/ arm  rest assist  Neuro.:  Walking in PT clinic with use of SPC and cuing to correct BOS/ upright posture/ head position.  Pt. Benefits from SBA/ CGA for safety.   Walking in //-bars with alt. UE/ LE 6x (unable to complete without 1 UE assist on //-bar for balance) Standing cone taps/ walking cone touches in //-bars Walking in //-bars with no UE assist working on using blue line as a guide for BOS/ consistent heel strike Amb. Outside to car with use of [facility]    PT Long Term Goals - 04/06/19 1203      PT LONG TERM GOAL #1   Title  Pt. will increase FOTO to 60 to improve functional mobility.    Baseline  Initial FOTO: 57.    Time  4    Period  Weeks    Status  New    Target Date  04/30/19      PT LONG TERM GOAL #2   Title  Pt. will increase B LE/hip flexion strength 1/2 muscle grade to improve gait/ balance/ safety.    Baseline  B LE muscle strength grossly 5/5  MMT except hip flexion 4/5 MMT (previous R hip surgery)    Time  4    Period  Weeks    Status  New    Target Date  04/30/19      PT LONG TERM GOAL #3   Title  Pt. will increase Berg balance test to >45/56 to promote safety/ decrease fall risk.    Baseline  Berg balance test:  39/56 (signifcant fall risk)    Time  4    Period  Weeks    Status  New    Target Date  04/30/19      PT LONG TERM GOAL #4   Title  Pt. will demonstrate consistent 2-point gait pattern with use of SPC to decrease fall risk/ promote safety.    Baseline  Pt. ambulates with inconsistent gait pattern with/ without use of SPC    Time  4    Period  Weeks    Status  New    Target Date  04/30/19            Plan - 04/07/19 1321    Clinical Impression Statement  Pt. requires light UE assist with more dynamic balance tasks in //-bars, esp. alt. UE and LE marching.  Pt. has generalized fatigue during Nustep but O2 sat. 93% and HR 84 bpm remained stable.  Pt. benefits from use of SPC and cuing to focus on BOS/ step pattern to decrease fall risk.  PT will  issued LE HEP next tx. session.    Stability/Clinical Decision Making  Evolving/Moderate complexity    Clinical Decision Making  Moderate    Rehab Potential  Fair    PT Frequency  2x / week    PT Duration  4 weeks    PT Treatment/Interventions  ADLs/Self Care Home Management;Gait training;Stair training;Functional mobility training;Neuromuscular re-education;Balance training;Therapeutic exercise;Therapeutic activities;Patient/family education;Manual techniques    PT Next Visit Plan  Issue HEP       Patient will benefit from skilled therapeutic intervention in order to improve the following deficits and impairments:  Abnormal gait, Decreased balance, Decreased endurance, Decreased mobility, Difficulty walking, Dizziness, Decreased activity tolerance, Decreased coordination, Decreased strength  Visit Diagnosis: Gait difficulty  Muscle weakness (generalized)  Balance problems     Problem List Patient Active Problem List   Diagnosis Date Noted  . Leukocytosis 06/29/2016  . Hypogammaglobulinemia ([place]) 06/29/2016  . Temporary cerebral vascular dysfunction 02/28/2014  . DD (diverticular disease) 02/28/2014  . Allergic rhinitis 02/28/2014  . Allergy status to sulfonamides 02/28/2014  . History of anticoagulant therapy 02/28/2014  . Arthritis 02/28/2014  . Airway hyperreactivity 02/28/2014  . A-fib ([place]) 02/28/2014  . Bilateral cataracts 02/28/2014  . Dermatitis, eczematoid 02/28/2014  . Allergy to environmental factors 02/28/2014  . Acid reflux 02/28/2014  . Polypharmacy 02/28/2014  . HLD (hyperlipidemia) 02/28/2014  . Adult hypothyroidism 02/28/2014  . BP (high blood pressure) 02/28/2014  . Arthritis, degenerative 02/28/2014  . Degenerative arthritis of hip 10/09/2013  . 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 Neck rigid 05/30/2013  . 1st degree AV block 10/10/2012  . History of biliary T-tube placement 06/14/2012  . History of cardioversion  06/05/2012  . Avascular necrosis of femoral head ([place]) 06/01/2012  . Lumbar radiculopathy 06/01/2012  . DDD (degenerative disc disease), lumbar 06/01/2012  . H/O cardiac catheterization 10/20/2009   Pura Spice, PT, DPT # [phone] 04/07/2019, 1:24 PM  [place] [facility] [address] [place], [place], [address]  Phone: [phone]   Fax:  [phone]  Name: [name] MRN: [id number] Date of Birth: 08/07/1924

## 2019-04-09 ENCOUNTER — Encounter: Payer: Self-pay | Admitting: Physical Therapy

## 2019-04-09 ENCOUNTER — Ambulatory Visit: Payer: Medicare Other | Admitting: Physical Therapy

## 2019-04-09 ENCOUNTER — Other Ambulatory Visit: Payer: Self-pay

## 2019-04-09 DIAGNOSIS — R2689 Other abnormalities of gait and mobility: Secondary | ICD-10-CM

## 2019-04-09 DIAGNOSIS — R269 Unspecified abnormalities of gait and mobility: Secondary | ICD-10-CM

## 2019-04-09 DIAGNOSIS — M6281 Muscle weakness (generalized): Secondary | ICD-10-CM

## 2019-04-09 NOTE — Therapy (Signed)
[place] [facility] [address] [place], [place], [address] Phone: [phone]   Fax:  [phone]  Physical Therapy Treatment  Patient Details  Name: [name] MRN: [id number] Date of Birth: May 20, 1924 Referring Provider (PT): Dr. Melrose Nakayama   Encounter Date: 04/09/2019  PT End of Session - 04/09/19 1035    Visit Number  3    Number of Visits  8    Date for PT Re-Evaluation  04/30/19    Authorization - Visit Number  3    Authorization - Number of Visits  10    PT Start Time  1024    PT Stop Time  1116    PT Time Calculation (min)  52 min    Equipment Utilized During Treatment  Gait belt    Activity Tolerance  Patient tolerated treatment well;Patient limited by pain    Behavior During Therapy  [facility] for tasks assessed/performed       Past Medical History:  Diagnosis Date  . Arthritis   . Asthma   . Atrial fibrillation ([place])   . Cataract   . Eczema   . GERD (gastroesophageal reflux disease)   . Gout of wrist   . Heart murmur   . Pacemaker   . Pseudogout of ankle or foot   . Thyroid disease   . TIA (transient ischemic attack)     Past Surgical History:  Procedure Laterality Date  . ABLATION    . EYE SURGERY    . foot sugery    . HIP ARTHROPLASTY     right  . PACEMAKER INSERTION      There were no vitals filed for this visit.  Subjective Assessment - 04/09/19 1030    Subjective  Pt. reports she fell back onto commode 2 days ago while trying to stand from commode.  Pt. states while she was standing she started to fall backwards and hit L ribs/ R glut on commode.  Pt. did not fall onto ground but was shaken up.  Pt. did not hit her head but brusing on L side/ribs noted.    Patient is accompained by:  Family member    Limitations  Lifting;Standing;Walking;House hold activities    Patient Stated Goals  Improve balance/ gait/ decrease fall risk.    Currently in Pain?  Yes    Pain Score  2     Pain Location  Hip     Pain Orientation  Right          There.ex.:  Supine hip/LE stretches (8 min.)- reassessment of R hip/ L ribs. Supine marching/ SLR/ partial bridging/ trunk rotation/ hip abduction 20x each.    Nustep L3 B UE/LE 10 min. (consistent cadence) Walking in //-bars forward with increase hip flexion/ lateral walking with light UE assist 10x each.    Neuro.:  Walking in PT clinic with use of SPC and cuing to correct BOS/ upright posture/ head position.  Pt. Benefits from SBA/ CGA for safety.   Sit to stands for mirror feedback for posture correction/ technique.  Amb. Outside to car with use of [facility]    PT Long Term Goals - 04/06/19 1203      PT LONG TERM GOAL #1   Title  Pt. will increase FOTO to 60 to improve functional mobility.    Baseline  Initial FOTO: 57.    Time  4    Period  Weeks    Status  New    Target Date  04/30/19      PT LONG TERM GOAL #2   Title  Pt. will increase B LE/hip flexion strength 1/2 muscle grade to improve gait/ balance/ safety.    Baseline  B LE muscle strength grossly 5/5 MMT except hip flexion 4/5 MMT (previous R hip surgery)    Time  4    Period  Weeks    Status  New    Target Date  04/30/19      PT LONG TERM GOAL #3   Title  Pt. will increase Berg balance test to >45/56 to promote safety/ decrease fall risk.    Baseline  Berg balance test:  39/56 (signifcant fall risk)    Time  4    Period  Weeks    Status  New    Target Date  04/30/19      PT LONG TERM GOAL #4   Title  Pt. will demonstrate consistent 2-point gait pattern with use of SPC to decrease fall risk/ promote safety.    Baseline  Pt. ambulates with inconsistent gait pattern with/ without use of SPC    Time  4    Period  Weeks    Status  New    Target Date  04/30/19            Plan - 04/09/19 1036    Clinical Impression Statement  Tx. progression with dynamic balance tasks limited today due to rib/hip discomfort after recent incident at home/commode.  Pt. benefits from  gentle LE stretches/ ther.ex. in supine position today.  L rib bruising noted with tenderness, as well as R hip pain with R sidelying position.  Pt. is able to ambulate with antalgic gait and use of SPC for safety.  No LOB during tx. session.  Pt. instructed to contact MD if symptoms worsen.    Stability/Clinical Decision Making  Evolving/Moderate complexity    Clinical Decision Making  Moderate    Rehab Potential  Fair    PT Frequency  2x / week    PT Duration  4 weeks    PT Treatment/Interventions  ADLs/Self Care Home Management;Gait training;Stair training;Functional mobility training;Neuromuscular re-education;Balance training;Therapeutic exercise;Therapeutic activities;Patient/family education;Manual techniques    PT Next Visit Plan  Issue HEP next tx./ Reassess L ribs and R hip       Patient will benefit from skilled therapeutic intervention in order to improve the following deficits and impairments:  Abnormal gait, Decreased balance, Decreased endurance, Decreased mobility, Difficulty walking, Dizziness, Decreased activity tolerance, Decreased coordination, Decreased strength  Visit Diagnosis: Gait difficulty  Muscle weakness (generalized)  Balance problems     Problem List Patient Active Problem List   Diagnosis Date Noted  . Leukocytosis 06/29/2016  . Hypogammaglobulinemia ([place]) 06/29/2016  . Temporary cerebral vascular dysfunction 02/28/2014  . DD (diverticular disease) 02/28/2014  . Allergic rhinitis 02/28/2014  . Allergy status to sulfonamides 02/28/2014  . History of anticoagulant therapy 02/28/2014  . Arthritis 02/28/2014  . Airway hyperreactivity 02/28/2014  . A-fib ([place]) 02/28/2014  . Bilateral cataracts 02/28/2014  . Dermatitis, eczematoid 02/28/2014  . Allergy to environmental factors 02/28/2014  . Acid reflux 02/28/2014  . Polypharmacy 02/28/2014  . HLD (hyperlipidemia) 02/28/2014  . Adult hypothyroidism 02/28/2014  . BP (high blood pressure) 02/28/2014   . Arthritis, degenerative 02/28/2014  . Degenerative arthritis of hip 10/09/2013  . 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 Neck rigid 05/30/2013  . 1st degree AV block 10/10/2012  . History of biliary T-tube placement 06/14/2012  .  History of cardioversion 06/05/2012  . Avascular necrosis of femoral head ([place]) 06/01/2012  . Lumbar radiculopathy 06/01/2012  . DDD (degenerative disc disease), lumbar 06/01/2012  . H/O cardiac catheterization 10/20/2009   Pura Spice, PT, DPT # [phone] 04/10/2019, 9:24 AM  [place] [address] [place], [place], [address] Phone: [phone]   Fax:  [phone]  Name: Lucye Bissette MRN: [id number] Date of Birth: 01-19-1925

## 2019-04-11 ENCOUNTER — Encounter: Payer: Medicare Other | Admitting: Physical Therapy

## 2019-04-16 ENCOUNTER — Encounter: Payer: Self-pay | Admitting: Physical Therapy

## 2019-04-16 ENCOUNTER — Other Ambulatory Visit: Payer: Self-pay

## 2019-04-16 ENCOUNTER — Ambulatory Visit: Payer: Medicare Other | Admitting: Physical Therapy

## 2019-04-16 DIAGNOSIS — R269 Unspecified abnormalities of gait and mobility: Secondary | ICD-10-CM | POA: Diagnosis not present

## 2019-04-16 DIAGNOSIS — R2689 Other abnormalities of gait and mobility: Secondary | ICD-10-CM

## 2019-04-16 DIAGNOSIS — M6281 Muscle weakness (generalized): Secondary | ICD-10-CM

## 2019-04-16 NOTE — Therapy (Signed)
[place] [facility] [address] [place], [place], [address] Phone: [phone]   Fax:  [phone]  Physical Therapy Treatment  Patient Details  Name: [name] MRN: [id number] Date of Birth: [birth year] Referring Provider (PT): Dr. Melrose Nakayama   Encounter Date: 04/16/2019  PT End of Session - 04/16/19 1212    Visit Number  4    Number of Visits  8    Date for PT Re-Evaluation  04/30/19    Authorization - Visit Number  4    Authorization - Number of Visits  10    PT Start Time  [id number]    PT Stop Time  1114    PT Time Calculation (min)  59 min    Equipment Utilized During Treatment  Gait belt    Activity Tolerance  Patient tolerated treatment well    Behavior During Therapy  [facility] for tasks assessed/performed       Past Medical History:  Diagnosis Date  . Arthritis   . Asthma   . Atrial fibrillation ([place])   . Cataract   . Eczema   . GERD (gastroesophageal reflux disease)   . Gout of wrist   . Heart murmur   . Pacemaker   . Pseudogout of ankle or foot   . Thyroid disease   . TIA (transient ischemic attack)     Past Surgical History:  Procedure Laterality Date  . ABLATION    . EYE SURGERY    . foot sugery    . HIP ARTHROPLASTY     right  . PACEMAKER INSERTION      There were no vitals filed for this visit.  Subjective Assessment - 04/16/19 1031    Subjective  Pt. reports no falls.  Pt. states she is fearful of falling.  Pt. states she is having some dizziness issues and questions about BP medications.  Pt. returns to Cardiolgist 04/30/2019.  PT recommends pt. contact MD to discuss medication questions/ issues.    Patient is accompained by:  Family member    Limitations  Lifting;Standing;Walking;House hold activities    Patient Stated Goals  Improve balance/ gait/ decrease fall risk.    Currently in Pain?  No/denies         BP: 135/76   HR: 72 bpm    There.ex.:  Nustep L3 B UE/LE 10 min. (consistent  cadence)  Walking in //-bars forward with increase hip flexion/ lateral walking with light UE assist 10x each.   Standing marching/ hip abduction/ knee flexion 20x each.  Neuro.:  Walking in PT clinic with use of SPC and cuing to correct BOS/ upright posture/ head position. Pt. Benefits from SBA/ CGA for safety.   Sit to stands 10x with min. UE assist 10x with cuing to correct.  Transfer training  Airex step ups/ overs with light to no UE assist.  CGA for safety/ cuing.   Amb. Outside to car with use of SPC. Up/down curb and over grassy terrain.        PT Long Term Goals - 04/06/19 1203      PT LONG TERM GOAL #1   Title  Pt. will increase FOTO to 60 to improve functional mobility.    Baseline  Initial FOTO: 57.    Time  4    Period  Weeks    Status  New    Target Date  04/30/19      PT LONG TERM GOAL #2   Title  Pt. will  increase B LE/hip flexion strength 1/2 muscle grade to improve gait/ balance/ safety.    Baseline  B LE muscle strength grossly 5/5 MMT except hip flexion 4/5 MMT (previous R hip surgery)    Time  4    Period  Weeks    Status  New    Target Date  04/30/19      PT LONG TERM GOAL #3   Title  Pt. will increase Berg balance test to >45/56 to promote safety/ decrease fall risk.    Baseline  Berg balance test:  39/56 (signifcant fall risk)    Time  4    Period  Weeks    Status  New    Target Date  04/30/19      PT LONG TERM GOAL #4   Title  Pt. will demonstrate consistent 2-point gait pattern with use of SPC to decrease fall risk/ promote safety.    Baseline  Pt. ambulates with inconsistent gait pattern with/ without use of SPC    Time  4    Period  Weeks    Status  New    Target Date  04/30/19            Plan - 04/16/19 1213    Clinical Impression Statement  No c/o pain reported during tx. session today.  Pt. fearful/ hesitant with ambulation around PT clinic.  Pt. benefits from use of SPC and upright posture with gait.  Pt. balance  worsens with forward posture/ head position and narrow BOS.  Pt. works hard during PT tx. session and progressing well with sit to stands/ transfers.  Moderate cuing for proper technique with standing/ transfers without assistive device.    Stability/Clinical Decision Making  Evolving/Moderate complexity    Clinical Decision Making  Moderate    Rehab Potential  Fair    PT Frequency  2x / week    PT Duration  4 weeks    PT Treatment/Interventions  ADLs/Self Care Home Management;Gait training;Stair training;Functional mobility training;Neuromuscular re-education;Balance training;Therapeutic exercise;Therapeutic activities;Patient/family education;Manual techniques    PT Next Visit Plan  Issue HEP next tx.       Patient will benefit from skilled therapeutic intervention in order to improve the following deficits and impairments:  Abnormal gait, Decreased balance, Decreased endurance, Decreased mobility, Difficulty walking, Dizziness, Decreased activity tolerance, Decreased coordination, Decreased strength  Visit Diagnosis: Gait difficulty  Muscle weakness (generalized)  Balance problems     Problem List Patient Active Problem List   Diagnosis Date Noted  . Leukocytosis 06/29/2016  . Hypogammaglobulinemia ([place]) 06/29/2016  . Temporary cerebral vascular dysfunction 02/28/2014  . DD (diverticular disease) 02/28/2014  . Allergic rhinitis 02/28/2014  . Allergy status to sulfonamides 02/28/2014  . History of anticoagulant therapy 02/28/2014  . Arthritis 02/28/2014  . Airway hyperreactivity 02/28/2014  . A-fib ([place]) 02/28/2014  . Bilateral cataracts 02/28/2014  . Dermatitis, eczematoid 02/28/2014  . Allergy to environmental factors 02/28/2014  . Acid reflux 02/28/2014  . Polypharmacy 02/28/2014  . HLD (hyperlipidemia) 02/28/2014  . Adult hypothyroidism 02/28/2014  . BP (high blood pressure) 02/28/2014  . Arthritis, degenerative 02/28/2014  . Degenerative arthritis of hip 10/09/2013   . 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 Neck rigid 05/30/2013  . 1st degree AV block 10/10/2012  . History of biliary T-tube placement 06/14/2012  . History of cardioversion 06/05/2012  . Avascular necrosis of femoral head ([place]) 06/01/2012  . Lumbar radiculopathy 06/01/2012  . DDD (degenerative disc disease), lumbar 06/01/2012  .  H/O cardiac catheterization 10/20/2009   Pura Spice, PT, DPT # [phone] 04/16/2019, 12:18 PM  [place] [facility] [address] [place], [place], [address] Phone: [phone]   Fax:  [phone]  Name: [name] MRN: [id number] Date of Birth: 09-Mar-1925

## 2019-04-18 ENCOUNTER — Ambulatory Visit: Payer: Medicare Other | Admitting: Physical Therapy

## 2019-04-18 ENCOUNTER — Other Ambulatory Visit: Payer: Self-pay

## 2019-04-23 ENCOUNTER — Encounter: Payer: Medicare Other | Admitting: Physical Therapy

## 2019-04-24 ENCOUNTER — Other Ambulatory Visit: Payer: Self-pay

## 2019-04-24 ENCOUNTER — Emergency Department: Payer: Medicare Other

## 2019-04-24 ENCOUNTER — Emergency Department
Admission: EM | Admit: 2019-04-24 | Discharge: 2019-04-24 | Disposition: A | Discharge status: Home / Self Care | Payer: Medicare Other | Attending: Emergency Medicine | Admitting: Emergency Medicine

## 2019-04-24 DIAGNOSIS — Z950 Presence of cardiac pacemaker: Secondary | ICD-10-CM | POA: Insufficient documentation

## 2019-04-24 DIAGNOSIS — Z7901 Long term (current) use of anticoagulants: Secondary | ICD-10-CM | POA: Insufficient documentation

## 2019-04-24 DIAGNOSIS — Z96641 Presence of right artificial hip joint: Secondary | ICD-10-CM | POA: Diagnosis not present

## 2019-04-24 DIAGNOSIS — I10 Essential (primary) hypertension: Secondary | ICD-10-CM

## 2019-04-24 DIAGNOSIS — J45909 Unspecified asthma, uncomplicated: Secondary | ICD-10-CM | POA: Diagnosis not present

## 2019-04-24 DIAGNOSIS — Z8673 Personal history of transient ischemic attack (TIA), and cerebral infarction without residual deficits: Secondary | ICD-10-CM | POA: Diagnosis not present

## 2019-04-24 DIAGNOSIS — Z7982 Long term (current) use of aspirin: Secondary | ICD-10-CM | POA: Insufficient documentation

## 2019-04-24 DIAGNOSIS — R030 Elevated blood-pressure reading, without diagnosis of hypertension: Secondary | ICD-10-CM | POA: Insufficient documentation

## 2019-04-24 DIAGNOSIS — Z79899 Other long term (current) drug therapy: Secondary | ICD-10-CM | POA: Diagnosis not present

## 2019-04-24 DIAGNOSIS — R002 Palpitations: Secondary | ICD-10-CM | POA: Insufficient documentation

## 2019-04-24 DIAGNOSIS — E039 Hypothyroidism, unspecified: Secondary | ICD-10-CM | POA: Insufficient documentation

## 2019-04-24 LAB — CBC
HCT: 41.7 % (ref 36.0–46.0)
Hemoglobin: 13.5 g/dL (ref 12.0–15.0)
MCH: 28.1 pg (ref 26.0–34.0)
MCHC: 32.4 g/dL (ref 30.0–36.0)
MCV: 86.7 fL (ref 80.0–100.0)
Platelets: 261 10*3/uL (ref 150–400)
RBC: 4.81 MIL/uL (ref 3.87–5.11)
RDW: 13.5 % (ref 11.5–15.5)
WBC: 9 10*3/uL (ref 4.0–10.5)
nRBC: 0 % (ref 0.0–0.2)

## 2019-04-24 LAB — BASIC METABOLIC PANEL
Anion gap: 9 (ref 5–15)
BUN: 17 mg/dL (ref 8–23)
CO2: 29 mmol/L (ref 22–32)
Calcium: 9.1 mg/dL (ref 8.9–10.3)
Chloride: 102 mmol/L (ref 98–111)
Creatinine, Ser: 0.45 mg/dL (ref 0.44–1.00)
GFR calc Af Amer: >60 mL/min (ref >60)
GFR calc non Af Amer: >60 mL/min (ref >60)
Glucose, Bld: 90 mg/dL (ref 70–99)
Potassium: 4.3 mmol/L (ref 3.5–5.1)
Sodium: 140 mmol/L (ref 135–145)

## 2019-04-24 LAB — TROPONIN I (HIGH SENSITIVITY): Troponin I (High Sensitivity): 7 ng/L (ref <18)

## 2019-04-24 MED ORDER — SODIUM CHLORIDE 0.9% FLUSH: 3.0000 mL | Freq: Once | INTRAVENOUS | Status: DC

## 2019-04-24 NOTE — ED Triage Notes (Signed)
Pt c/o HTN and feeling like her heart is racing with SOB since last night, pt has a pacemaker. Pt is in NAD, respirations WNL.

## 2019-04-24 NOTE — Discharge Instructions (Signed)
Please follow-up with Dr. Nehemiah Massed within the next week or so for recheck/reevaluation.  Return to the emergency department if you develop any chest pain, shortness of breath, or any other symptom personally concerning to yourself.

## 2019-04-24 NOTE — ED Provider Notes (Signed)
[facility] Emergency Department Provider Note  Time seen: 12:48 PM  I have reviewed the triage vital signs and the nursing notes.   HISTORY  Chief Complaint Palpitations and Hypertension   HPI [name] is a 85 y.o. female with a past medical history of gastric reflux, asthma, pacemaker, presents to the emergency department with concern over hypertension and palpitations.  According to the patient they are currently scheduling her to have her pacemaker generator replaced.  Patient states yesterday she felt like she was having palpitations, but states they went away throughout the day this morning she felt she was having palpitations once again and checked her blood pressure and it was [id number] systolic.  This concerned the patient so she came to the emergency department for evaluation.  Patient denies any chest pain at any point.  Patient states she felt very mildly short of breath this morning but states she was "panicking."  Denies any shortness of breath currently.  Denies any cough or fever.  Patient states she feels normal now and wants to eat.   Past Medical History:  Diagnosis Date  . Arthritis   . Asthma   . Atrial fibrillation ([place])   . Cataract   . Eczema   . GERD (gastroesophageal reflux disease)   . Gout of wrist   . Heart murmur   . Pacemaker   . Pseudogout of ankle or foot   . Thyroid disease   . TIA (transient ischemic attack)     Patient Active Problem List   Diagnosis Date Noted  . Leukocytosis 06/29/2016  . Hypogammaglobulinemia ([place]) 06/29/2016  . Temporary cerebral vascular dysfunction 02/28/2014  . DD (diverticular disease) 02/28/2014  . Allergic rhinitis 02/28/2014  . Allergy status to sulfonamides 02/28/2014  . History of anticoagulant therapy 02/28/2014  . Arthritis 02/28/2014  . Airway hyperreactivity 02/28/2014  . A-fib ([place]) 02/28/2014  . Bilateral cataracts 02/28/2014  . Dermatitis, eczematoid 02/28/2014  . Allergy  to environmental factors 02/28/2014  . Acid reflux 02/28/2014  . Polypharmacy 02/28/2014  . HLD (hyperlipidemia) 02/28/2014  . Adult hypothyroidism 02/28/2014  . BP (high blood pressure) 02/28/2014  . Arthritis, degenerative 02/28/2014  . Degenerative arthritis of hip 10/09/2013  . 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 Neck rigid 05/30/2013  . 1st degree AV block 10/10/2012  . History of biliary T-tube placement 06/14/2012  . History of cardioversion 06/05/2012  . Avascular necrosis of femoral head ([place]) 06/01/2012  . Lumbar radiculopathy 06/01/2012  . DDD (degenerative disc disease), lumbar 06/01/2012  . H/O cardiac catheterization 10/20/2009    Past Surgical History:  Procedure Laterality Date  . ABLATION    . EYE SURGERY    . foot sugery    . HIP ARTHROPLASTY     right  . PACEMAKER INSERTION      Prior to Admission medications   Medication Sig Start Date End Date Taking? Authorizing Provider  acetaminophen (TYLENOL) 500 MG tablet Take 1,000 mg by mouth 2 (two) times daily as needed for mild pain or headache.     [provider]  acetaminophen (TYLENOL) 650 MG CR tablet Take 650 mg by mouth every 8 (eight) hours as needed for pain.    [provider]  albuterol (PROVENTIL HFA;VENTOLIN HFA) 108 (90 Base) MCG/ACT inhaler Inhale 2 puffs into the lungs every 6 (six) hours as needed.  04/15/11   [provider]  apixaban (ELIQUIS) 5 MG TABS tablet Take 5 mg by mouth 2 (  two) times daily.  06/26/13   [provider]  aspirin EC 81 MG tablet Take 81 mg by mouth daily.    [provider]  atorvastatin (LIPITOR) 40 MG tablet Take 40 mg by mouth daily.  11/14/13   [provider]  budesonide-formoterol (SYMBICORT) 160-4.5 MCG/ACT inhaler Inhale 2 puffs into the lungs 2 (two) times daily.  09/09/13   [provider]  cholecalciferol (VITAMIN D) 1000 UNITS tablet Take 1,000 Units by mouth daily.      [provider]  Cholecalciferol (VITAMIN D-1000 MAX ST) 1000 UNITS tablet Take by mouth.    [provider]  clobetasol cream (TEMOVATE) [id number] % Apply 1 application topically 2 (two) times daily.     [provider]  colchicine 0.6 MG tablet Take 0.6 mg by mouth daily.  01/20/14   [provider]  desonide (DESOWEN) 0.05 % ointment Apply 1 application topically 2 (two) times daily.    [provider]  diclofenac sodium (VOLTAREN) 1 % GEL Apply 2 grams bid prn 08/22/13   [provider]  fluticasone (FLONASE) 50 MCG/ACT nasal spray USE 2 SPRAYS IN EACH NOSTRIL DAILY 12/27/13   [provider]  HYDROcodone-acetaminophen (NORCO/VICODIN) 5-325 MG tablet Take 1 tablet by mouth every 6 (six) hours as needed for pain.    [provider]  hydrocortisone valerate cream (WESTCORT) 0.2 % Apply topically. 12/30/15   [provider]  ipratropium (ATROVENT) 0.06 % nasal spray Place 2 sprays into both nostrils 3 (three) times daily.  09/25/13   [provider]  levothyroxine (SYNTHROID, LEVOTHROID) 112 MCG tablet Take 112 mcg by mouth daily before breakfast.    [provider]  lidocaine (LIDODERM) 5 % Place 1 patch onto the skin every 12 (twelve) hours as needed.     [provider]  meclizine (ANTIVERT) 12.5 MG tablet Take 12.5 mg by mouth daily. 04/27/16   [provider]  metoprolol succinate (TOPROL-XL) 25 MG 24 hr tablet Take 25 mg by mouth 2 (two) times daily. 05/17/18   [provider]  montelukast (SINGULAIR) 10 MG tablet Take 10 mg by mouth at bedtime. TAKE 1 TABLET DAILY 12/27/13   [provider]  Olopatadine HCl (PAZEO) 0.7 % SOLN Apply 1 drop to eye daily.    [provider]  Omega-3 1000 MG CAPS Take 1,000 mg by mouth daily.     [provider]  omeprazole (PRILOSEC) 20 MG capsule Take 20 mg by mouth daily.     [provider]  potassium chloride SA  (KLOR-CON M20) 20 MEQ tablet Take 20 mEq by mouth daily.  01/20/14 06/29/16  [provider]  SYMBICORT 80-4.5 MCG/ACT inhaler Inhale 2 puffs into the lungs daily as needed.  05/16/16   [provider]    Allergies  Allergen Reactions  . Fluticasone-Salmeterol Anaphylaxis  . Albuterol     Other reaction(s): Other (See Comments) It burns and makes her cough "too strong for me or something"  . Ciprofloxacin Other (See Comments)    Other reaction(s): Unknown Does not remember  . Codeine Hives  . Colchicine Diarrhea  . Promethazine Other (See Comments)    Unable to speak. Loss muscle control (PHENGERAN)  . Valacyclovir     Other reaction(s): Muscle Pain Could hardly move  . Guaifenesin Palpitations  . Sulfa Antibiotics Rash    Family History  Problem Relation Age of Onset  . Leukemia Father   . Lung cancer Sister   .  Breast cancer Neg Hx     Social History Social History   Tobacco Use  . Smoking status: Never Smoker  . Smokeless tobacco: Never Used  Substance Use Topics  . Alcohol use: Not Currently    Alcohol/week: 0.0 standard drinks  . Drug use: No    Review of Systems Constitutional: Negative for fever. Cardiovascular: Negative for chest pain. Respiratory: Minimal shortness of breath this morning now resolved. Gastrointestinal: Negative for abdominal pain Musculoskeletal: Negative for musculoskeletal complaints Neurological: Negative for headache All other ROS negative  ____________________________________________   PHYSICAL EXAM:  VITAL SIGNS: ED Triage Vitals [04/24/19 0846]  Enc Vitals Group     BP (!) 145/78     Pulse Rate 73     Resp 16     Temp 98.4 F (36.9 C)     Temp Source Oral     SpO2 100 %     Weight 140 lb (63.5 kg)     Height 5\' 5"  (1.651 m)     Head Circumference      Peak Flow      Pain Score 0     Pain Loc      Pain Edu?      Excl. in [place]?     Constitutional: Alert and oriented. Well appearing and in no  distress.  Appears younger than stated age. Eyes: Normal exam ENT      Head: Normocephalic and atraumatic.      Mouth/Throat: Mucous membranes are moist. Cardiovascular: Normal rate, regular rhythm.  Respiratory: Normal respiratory effort without tachypnea nor retractions. Breath sounds are clear Gastrointestinal: Soft and nontender. No distention.   Musculoskeletal: Nontender with normal range of motion in all extremities.  Neurologic:  Normal speech and language. No gross focal neurologic deficits  Skin:  Skin is warm, dry and intact.  Psychiatric: Mood and affect are normal.  ____________________________________________    EKG  EKG viewed and interpreted by myself shows a ventricular paced rhythm at 71 bpm with a widened QRS, nonspecific ST changes.  ____________________________________________    RADIOLOGY  Chest x-ray clear  ____________________________________________   INITIAL IMPRESSION / ASSESSMENT AND PLAN / ED COURSE  Pertinent labs & imaging results that were available during my care of the patient were reviewed by me and considered in my medical decision making (see chart for details).   Patient presents to the emergency department for hypertension and believes she was having some palpitations as well.  Patient's main concern was that her pacemaker battery had died and states she "panicked."  Currently the patient appears well, very well-appearing on physical exam, reassuring vitals including blood pressure 145/78.  EKG shows a ventricular paced rhythm appears to be functioning correctly.  Patient's lab work including cardiac enzymes are normal.  Given the patient's reassuring work-up reassuring physical exam and reassuring EKG I believe the patient is safe for discharge home with cardiology follow-up.  I discussed return precautions.  Patient agreeable to plan of care.  [name] was evaluated in Emergency Department on 04/24/2019 for the symptoms  described in the history of present illness. She was evaluated in the context of the global COVID-19 pandemic, which necessitated consideration that the patient might be at risk for infection with the SARS-CoV-2 virus that causes COVID-19. Institutional protocols and algorithms that pertain to the evaluation of patients at risk for COVID-19 are in a state of rapid change based on information released by regulatory bodies including the CDC and federal and state organizations. These  policies and algorithms were followed during the patient's care in the ED.  ____________________________________________   FINAL CLINICAL IMPRESSION(S) / ED DIAGNOSES  Palpitations Hypertension   Harvest Dark, MD 04/24/19 1251

## 2019-04-25 ENCOUNTER — Encounter: Payer: Medicare Other | Admitting: Physical Therapy

## 2019-04-29 ENCOUNTER — Other Ambulatory Visit: Payer: Self-pay

## 2019-04-29 ENCOUNTER — Ambulatory Visit: Payer: Medicare Other | Attending: Neurology | Admitting: Physical Therapy

## 2019-04-29 DIAGNOSIS — R2689 Other abnormalities of gait and mobility: Secondary | ICD-10-CM | POA: Diagnosis present

## 2019-04-29 DIAGNOSIS — R269 Unspecified abnormalities of gait and mobility: Secondary | ICD-10-CM

## 2019-04-29 DIAGNOSIS — M6281 Muscle weakness (generalized): Secondary | ICD-10-CM

## 2019-04-29 NOTE — Therapy (Signed)
[facility] [address] [place], [place], [address] Phone: [phone]   Fax:  [phone]  Physical Therapy Treatment  Patient Details  Name: [name] MRN: [id number] Date of Birth: March 20, 1925 Referring Provider (PT): Dr. Melrose Nakayama   Encounter Date: 04/29/2019  PT End of Session - 05/03/19 1607    Visit Number  5    Number of Visits  8    Date for PT Re-Evaluation  04/30/19    Authorization - Visit Number  5    Authorization - Number of Visits  10    PT Start Time  1111    PT Stop Time  1201    PT Time Calculation (min)  50 min    Equipment Utilized During Treatment  Gait belt    Activity Tolerance  Patient tolerated treatment well    Behavior During Therapy  [facility] for tasks assessed/performed       Past Medical History:  Diagnosis Date  . Arthritis   . Asthma   . Atrial fibrillation ([place])   . Cataract   . Eczema   . GERD (gastroesophageal reflux disease)   . Gout of wrist   . Heart murmur   . Pacemaker   . Pseudogout of ankle or foot   . Thyroid disease   . TIA (transient ischemic attack)     Past Surgical History:  Procedure Laterality Date  . ABLATION    . EYE SURGERY    . foot sugery    . HIP ARTHROPLASTY     right  . PACEMAKER INSERTION      There were no vitals filed for this visit.  Subjective Assessment - 05/03/19 1605    Subjective  Pt. states she is having some dizziness issues.  Pt. went to ED due to BP but everything was okay.   Pt. returns to Cardiolgist 04/30/2019.    Patient is accompained by:  Family member    Limitations  Lifting;Standing;Walking;House hold activities    Patient Stated Goals  Improve balance/ gait/ decrease fall risk.    Currently in Pain?  No/denies          There.ex.:  Nustep L3 B UE/LE 10 min. (consistent cadence)  Walking in //-bars forward with increase hip flexion/ lateral walking with light UE assist 10x each.   Standing marching/ hip  abduction/ knee flexion 20x each.   Neuro.:  Walking in PT clinic with use of SPC and cuing to correct BOS/ upright posture/ head position. Pt. Benefits from SBA/ CGA for safety.  Sit to stands 10x with min. UE assist 10x with cuing to correct.  Transfer training  Airex step ups/ overs with light to no UE assist.  CGA for safety/ cuing.   Amb. Outside to car with use of SPC. Up/down curb and over grassy terrain.      PT Long Term Goals - 04/06/19 1203      PT LONG TERM GOAL #1   Title  Pt. will increase FOTO to 60 to improve functional mobility.    Baseline  Initial FOTO: 57.    Time  4    Period  Weeks    Status  New    Target Date  04/30/19      PT LONG TERM GOAL #2   Title  Pt. will increase B LE/hip flexion strength 1/2 muscle grade to improve gait/ balance/ safety.    Baseline  B LE muscle strength grossly 5/5 MMT except hip  flexion 4/5 MMT (previous R hip surgery)    Time  4    Period  Weeks    Status  New    Target Date  04/30/19      PT LONG TERM GOAL #3   Title  Pt. will increase Berg balance test to >45/56 to promote safety/ decrease fall risk.    Baseline  Berg balance test:  39/56 (signifcant fall risk)    Time  4    Period  Weeks    Status  New    Target Date  04/30/19      PT LONG TERM GOAL #4   Title  Pt. will demonstrate consistent 2-point gait pattern with use of SPC to decrease fall risk/ promote safety.    Baseline  Pt. ambulates with inconsistent gait pattern with/ without use of SPC    Time  4    Period  Weeks    Status  New    Target Date  04/30/19         Plan - 05/03/19 1607    Clinical Impression Statement  Moderate cuing during tx./ gait to promote improved pattern and BOS.  Pt. benefits from assistive device with all aspects of standing/ gait for safety.  Several episodes of increase dizziness/ balance issues with position changes.  Pt. is planning to discuss issues with Cardiologist tomorrow.  Pt. feels her medications (Beta  blockers) are causing the issues.    Stability/Clinical Decision Making  Evolving/Moderate complexity    Clinical Decision Making  Moderate    Rehab Potential  Fair    PT Frequency  2x / week    PT Duration  4 weeks    PT Treatment/Interventions  ADLs/Self Care Home Management;Gait training;Stair training;Functional mobility training;Neuromuscular re-education;Balance training;Therapeutic exercise;Therapeutic activities;Patient/family education;Manual techniques    PT Next Visit Plan  Issue HEP next tx.   RECERT NEXT TX       Patient will benefit from skilled therapeutic intervention in order to improve the following deficits and impairments:  Abnormal gait, Decreased balance, Decreased endurance, Decreased mobility, Difficulty walking, Dizziness, Decreased activity tolerance, Decreased coordination, Decreased strength  Visit Diagnosis: Gait difficulty  Muscle weakness (generalized)  Balance problems     Problem List Patient Active Problem List   Diagnosis Date Noted  . Leukocytosis 06/29/2016  . Hypogammaglobulinemia ([place]) 06/29/2016  . Temporary cerebral vascular dysfunction 02/28/2014  . DD (diverticular disease) 02/28/2014  . Allergic rhinitis 02/28/2014  . Allergy status to sulfonamides 02/28/2014  . History of anticoagulant therapy 02/28/2014  . Arthritis 02/28/2014  . Airway hyperreactivity 02/28/2014  . A-fib ([place]) 02/28/2014  . Bilateral cataracts 02/28/2014  . Dermatitis, eczematoid 02/28/2014  . Allergy to environmental factors 02/28/2014  . Acid reflux 02/28/2014  . Polypharmacy 02/28/2014  . HLD (hyperlipidemia) 02/28/2014  . Adult hypothyroidism 02/28/2014  . BP (high blood pressure) 02/28/2014  . Arthritis, degenerative 02/28/2014  . Degenerative arthritis of hip 10/09/2013  . 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 Neck rigid 05/30/2013  . 1st degree AV block 10/10/2012  . History of biliary T-tube placement 06/14/2012  .  History of cardioversion 06/05/2012  . Avascular necrosis of femoral head ([place]) 06/01/2012  . Lumbar radiculopathy 06/01/2012  . DDD (degenerative disc disease), lumbar 06/01/2012  . H/O cardiac catheterization 10/20/2009   Pura Spice, PT, DPT # [id number] 05/03/2019, 4:11 PM  [facility] [address] [place], [place], [address] Phone: [phone]   Fax:  [phone]  Name: Dazariah Natter MRN: [id number] Date of Birth: [birth year]

## 2019-05-02 ENCOUNTER — Ambulatory Visit: Payer: Medicare Other | Admitting: Physical Therapy

## 2019-05-03 ENCOUNTER — Encounter: Payer: Self-pay | Admitting: Physical Therapy

## 2019-05-06 ENCOUNTER — Other Ambulatory Visit: Payer: Self-pay

## 2019-05-06 ENCOUNTER — Encounter: Payer: Self-pay | Admitting: Physical Therapy

## 2019-05-06 ENCOUNTER — Ambulatory Visit: Payer: Medicare Other | Admitting: Physical Therapy

## 2019-05-06 DIAGNOSIS — R269 Unspecified abnormalities of gait and mobility: Secondary | ICD-10-CM

## 2019-05-06 DIAGNOSIS — M6281 Muscle weakness (generalized): Secondary | ICD-10-CM

## 2019-05-06 DIAGNOSIS — R2689 Other abnormalities of gait and mobility: Secondary | ICD-10-CM

## 2019-05-06 NOTE — Therapy (Deleted)
[facility] [address] [place], [place], [address] Phone: [phone]   Fax:  [phone]  Physical Therapy Treatment  Patient Details  Name: [name] MRN: [id number] Date of Birth: [birth year] Referring Provider (PT): Dr. Melrose Nakayama   Encounter Date: 05/06/2019  PT End of Session - 05/06/19 1352    Visit Number  6    Number of Visits  14    Date for PT Re-Evaluation  06/03/19    Authorization - Visit Number  1    Authorization - Number of Visits  10    PT Start Time  1031    PT Stop Time  1120    PT Time Calculation (min)  49 min    Equipment Utilized During Treatment  Gait belt    Activity Tolerance  Patient tolerated treatment well    Behavior During Therapy  [facility] for tasks assessed/performed       Past Medical History:  Diagnosis Date  . Arthritis   . Asthma   . Atrial fibrillation ([place])   . Cataract   . Eczema   . GERD (gastroesophageal reflux disease)   . Gout of wrist   . Heart murmur   . Pacemaker   . Pseudogout of ankle or foot   . Thyroid disease   . TIA (transient ischemic attack)     Past Surgical History:  Procedure Laterality Date  . ABLATION    . EYE SURGERY    . foot sugery    . HIP ARTHROPLASTY     right  . PACEMAKER INSERTION      There were no vitals filed for this visit.  Subjective Assessment - 05/06/19 1045    Subjective  Pt. reports dizziness is getting better after taking herself off of her medication. Pt. reports she will see her cardiologist tomorrow. Pt. reports feeling more off balance than dizziness.    Patient is accompained by:  Family member    Limitations  Lifting;Standing;Walking;House hold activities    Patient Stated Goals  Improve balance/ gait/ decrease fall risk.    Currently in Pain?  No/denies         Objective:  Therex:   Nustep B UE/LE 10 min (warm-up/ discuss mobility at home) Sit to stand at 19 inches height 10 reps x 2 sets Reviewed  HEP  Neuro:   Ambulating within //-bars x 4 rounds (no UE assist/ mirror feedback) Ambulating on grassy and hard surfaces 50 ft (outside walking with use of SPC and SBA for cuing/ safety).  Descend 1 curb outside to car.  Berg Balance Test 42/56       PT Long Term Goals - 05/07/19 0856      PT LONG TERM GOAL #1   Title  Pt. will increase FOTO to 60 to improve functional mobility.    Baseline  Initial FOTO: 57.    Time  4    Period  Weeks    Status  On-going    Target Date  06/03/19      PT LONG TERM GOAL #2   Title  Pt. will increase B LE/hip flexion strength 1/2 muscle grade to improve gait/ balance/ safety.    Baseline  B LE muscle strength grossly 5/5 MMT except hip flexion 4/5 MMT (previous R hip surgery)    Time  4    Period  Weeks    Status  On-going    Target Date  06/03/19  PT LONG TERM GOAL #3   Title  Pt. will increase Berg balance test to >45/56 to promote safety/ decrease fall risk.    Baseline  Berg balance test:  39/56 (signifcant fall risk).  1/11: 42/56    Time  4    Period  Weeks    Status  Partially Met    Target Date  06/03/19      PT LONG TERM GOAL #4   Title  Pt. will demonstrate consistent 2-point gait pattern with use of SPC to decrease fall risk/ promote safety.    Baseline  Pt. ambulates with inconsistent gait pattern with/ without use of SPC    Time  4    Period  Weeks    Status  On-going    Target Date  06/03/19            Plan - 05/06/19 1331    Clinical Impression Statement  Pt required vebral cueing to decend from stand to sit controlling with minimum use of hands. Pt needed light UE assist to maintain her balance during tandem stance and moderate UE assist during single leg stance within //-bars. Pt was able to perform sit to stand with proper techniques at blue mat table at 19 inches height without compensating after vebral cueing. Berg test scores were 42/56 (improvement noted).  See update goals.    Stability/Clinical  Decision Making  Evolving/Moderate complexity    Clinical Decision Making  Moderate    Rehab Potential  Fair    PT Frequency  2x / week    PT Duration  4 weeks    PT Treatment/Interventions  ADLs/Self Care Home Management;Gait training;Stair training;Functional mobility training;Neuromuscular re-education;Balance training;Therapeutic exercise;Therapeutic activities;Patient/family education;Manual techniques    PT Next Visit Plan  Issue HEP next tx.       Patient will benefit from skilled therapeutic intervention in order to improve the following deficits and impairments:  Abnormal gait, Decreased balance, Decreased endurance, Decreased mobility, Difficulty walking, Dizziness, Decreased activity tolerance, Decreased coordination, Decreased strength  Visit Diagnosis: Gait difficulty  Muscle weakness (generalized)  Balance problems     Problem List Patient Active Problem List   Diagnosis Date Noted  . Leukocytosis 06/29/2016  . Hypogammaglobulinemia ([place]) 06/29/2016  . Temporary cerebral vascular dysfunction 02/28/2014  . DD (diverticular disease) 02/28/2014  . Allergic rhinitis 02/28/2014  . Allergy status to sulfonamides 02/28/2014  . History of anticoagulant therapy 02/28/2014  . Arthritis 02/28/2014  . Airway hyperreactivity 02/28/2014  . A-fib ([place]) 02/28/2014  . Bilateral cataracts 02/28/2014  . Dermatitis, eczematoid 02/28/2014  . Allergy to environmental factors 02/28/2014  . Acid reflux 02/28/2014  . Polypharmacy 02/28/2014  . HLD (hyperlipidemia) 02/28/2014  . Adult hypothyroidism 02/28/2014  . BP (high blood pressure) 02/28/2014  . Arthritis, degenerative 02/28/2014  . Degenerative arthritis of hip 10/09/2013  . 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 Neck rigid 05/30/2013  . 1st degree AV block 10/10/2012  . History of biliary T-tube placement 06/14/2012  . History of cardioversion 06/05/2012  . Avascular necrosis of femoral head  ([place]) 06/01/2012  . Lumbar radiculopathy 06/01/2012  . DDD (degenerative disc disease), lumbar 06/01/2012  . H/O cardiac catheterization 10/20/2009   Pura Spice, PT, DPT # [place], SPT 05/07/2019, 9:01 AM  [facility] [address] [place], [place], [address] Phone: [phone]   Fax:  [phone]  Name: Shellene Sweigert MRN: [id number] Date of Birth: 1925/03/03

## 2019-05-08 ENCOUNTER — Other Ambulatory Visit: Payer: Self-pay | Admitting: Cardiology

## 2019-05-08 ENCOUNTER — Ambulatory Visit: Payer: Medicare Other | Admitting: Physical Therapy

## 2019-05-08 ENCOUNTER — Other Ambulatory Visit: Payer: Self-pay

## 2019-05-08 ENCOUNTER — Encounter: Payer: Self-pay | Admitting: Physical Therapy

## 2019-05-08 DIAGNOSIS — R269 Unspecified abnormalities of gait and mobility: Secondary | ICD-10-CM

## 2019-05-08 DIAGNOSIS — R2689 Other abnormalities of gait and mobility: Secondary | ICD-10-CM

## 2019-05-08 DIAGNOSIS — M6281 Muscle weakness (generalized): Secondary | ICD-10-CM

## 2019-05-08 NOTE — Therapy (Signed)
[facility] [address] [place], [place], [address] Phone: [phone]   Fax:  [phone]  Physical Therapy Treatment  Patient Details  Name: [name] MRN: [id number] Date of Birth: [birth year] Referring Provider (PT): Dr. Melrose Nakayama   Encounter Date: 05/08/2019  PT End of Session - 05/08/19 1314    Visit Number  7    Number of Visits  14    Date for PT Re-Evaluation  06/03/19    Authorization - Visit Number  2    Authorization - Number of Visits  10    PT Start Time  1016    PT Stop Time  1104    PT Time Calculation (min)  48 min    Equipment Utilized During Treatment  Gait belt    Activity Tolerance  Other (comment)   Pt. was afraid of falling during obstacle course training. The balance training was modified accordingly.   Behavior During Therapy  [facility] for tasks assessed/performed       Past Medical History:  Diagnosis Date  . Arthritis   . Asthma   . Atrial fibrillation ([place])   . Cataract   . Eczema   . GERD (gastroesophageal reflux disease)   . Gout of wrist   . Heart murmur   . Pacemaker   . Pseudogout of ankle or foot   . Thyroid disease   . TIA (transient ischemic attack)     Past Surgical History:  Procedure Laterality Date  . ABLATION    . EYE SURGERY    . foot sugery    . HIP ARTHROPLASTY     right  . PACEMAKER INSERTION      There were no vitals filed for this visit.  Subjective Assessment - 05/08/19 1309    Subjective  Pt. saw Cardiologist yesterday on 05/06/2018 and stopped BP medication.  Pt. reported her cardiologist informed her that her pacemaker's battery needed to be replaced and the procedure will happen in a few weeks. Pt. reported she is nervous about the upcoming procedure due to her age/ previous surgery.    Patient is accompained by:  Family member    Limitations  Lifting;Standing;Walking;House hold activities    Patient Stated Goals  Improve balance/ gait/ decrease fall  risk.    Currently in Pain?  No/denies        Objective:  Therex: (<7 min.)  Seated GTB hip abduction 10 reps x 2 sets See new HEP  Neuro:  Obstacle course (tandem walk on narrowed step, step over a step, step over two yoga blocks, reach cones without moving feet cones x 20) with CBA x 2 rounds Forward marching/lateral walking with SBA within //-bars x 3 rounds Backward walking with SBA within //-bars x 4 rounds Tandem stance SBA/CBA within //-bars x 30 s x 1 round bilaterally Gait activities in clinic/ outside to car with use of SPC.        PT Education - 05/09/19 1213    Education Details  See new HEP    Person(s) Educated  Patient    Methods  Explanation;Demonstration;Handout    Comprehension  Verbalized understanding;Returned demonstration          PT Long Term Goals - 05/07/19 0856      PT LONG TERM GOAL #1   Title  Pt. will increase FOTO to 60 to improve functional mobility.    Baseline  Initial FOTO: 57.    Time  4    Period  Weeks    Status  On-going    Target Date  06/03/19      PT LONG TERM GOAL #2   Title  Pt. will increase B LE/hip flexion strength 1/2 muscle grade to improve gait/ balance/ safety.    Baseline  B LE muscle strength grossly 5/5 MMT except hip flexion 4/5 MMT (previous R hip surgery)    Time  4    Period  Weeks    Status  On-going    Target Date  06/03/19      PT LONG TERM GOAL #3   Title  Pt. will increase Berg balance test to >45/56 to promote safety/ decrease fall risk.    Baseline  Berg balance test:  39/56 (signifcant fall risk).  1/11: 42/56    Time  4    Period  Weeks    Status  Partially Met    Target Date  06/03/19      PT LONG TERM GOAL #4   Title  Pt. will demonstrate consistent 2-point gait pattern with use of SPC to decrease fall risk/ promote safety.    Baseline  Pt. ambulates with inconsistent gait pattern with/ without use of SPC    Time  4    Period  Weeks    Status  On-going    Target Date  06/03/19          Plan - 05/08/19 1325    Clinical Impression Statement  Pt. was not able to do the obstacle course without use one hand to touch the wall or use her cane due to fear of falling. Pt. was nervous about the upcoming procedure to replace her pacemaker's battery, which was an influencing factor during tx. session (per pts report).  The balance training was modified from obstacle course in the hallway to forward/backward/lateral walking without holding bars and tandem stance with touching bars when needed for maintaining balance within //-bars.  Pt. tolerated GTB seated hip abduction well.  See update new HEP (reviewed and demonstrated).    Stability/Clinical Decision Making  Evolving/Moderate complexity    Clinical Decision Making  Moderate    Rehab Potential  Fair    PT Frequency  2x / week    PT Duration  4 weeks    PT Treatment/Interventions  ADLs/Self Care Home Management;Gait training;Stair training;Functional mobility training;Neuromuscular re-education;Balance training;Therapeutic exercise;Therapeutic activities;Patient/family education;Manual techniques    PT Next Visit Plan  Review HEP/ progress balance (discuss appt for sx).    PT Home Exercise Plan  Access Code: [email or web address]       Patient will benefit from skilled therapeutic intervention in order to improve the following deficits and impairments:  Abnormal gait, Decreased balance, Decreased endurance, Decreased mobility, Difficulty walking, Dizziness, Decreased activity tolerance, Decreased coordination, Decreased strength  Visit Diagnosis: Gait difficulty  Muscle weakness (generalized)  Balance problems     Problem List Patient Active Problem List   Diagnosis Date Noted  . Leukocytosis 06/29/2016  . Hypogammaglobulinemia ([place]) 06/29/2016  . Temporary cerebral vascular dysfunction 02/28/2014  . DD (diverticular disease) 02/28/2014  . Allergic rhinitis 02/28/2014  . Allergy status to sulfonamides 02/28/2014  . History of  anticoagulant therapy 02/28/2014  . Arthritis 02/28/2014  . Airway hyperreactivity 02/28/2014  . A-fib ([place]) 02/28/2014  . Bilateral cataracts 02/28/2014  . Dermatitis, eczematoid 02/28/2014  . Allergy to environmental factors 02/28/2014  . Acid reflux 02/28/2014  . Polypharmacy 02/28/2014  . HLD (hyperlipidemia) 02/28/2014  . Adult hypothyroidism 02/28/2014  . BP (high blood  pressure) 02/28/2014  . Arthritis, degenerative 02/28/2014  . Degenerative arthritis of hip 10/09/2013  . 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 Neck rigid 05/30/2013  . 1st degree AV block 10/10/2012  . History of biliary T-tube placement 06/14/2012  . History of cardioversion 06/05/2012  . Avascular necrosis of femoral head ([place]) 06/01/2012  . Lumbar radiculopathy 06/01/2012  . DDD (degenerative disc disease), lumbar 06/01/2012  . H/O cardiac catheterization 10/20/2009   Pura Spice, PT, DPT # [place], SPT 05/09/2019, 12:19 PM  [facility] [address] [place], [place], [address] Phone: [phone]   Fax:  [phone]  Name: [name] MRN: [id number] Date of Birth: 06/12/1924

## 2019-05-08 NOTE — Patient Instructions (Signed)
Access Code: [email or web address]  URL: [email or web address]  Date: 05/08/2019  Prepared by: Dorcas Carrow   Exercises  Standing Tandem Balance with Counter Support - 3 reps - 1 sets - 1 min hold - 1x daily - 7x weekly  Standing Hip Abduction with Counter Support - 10 reps - 3 sets - 1x daily - 7x weekly  Standing March with Counter Support - 10 reps - 3 sets - 3 sec hold - 1x daily - 7x weekly  Seated Hip Abduction with Resistance - 10 reps - 3 sets - 1x daily - 7x weekly  Seated Long Arc Quad - 10 reps - 3 sets - 3 sec hold - 1x daily - 7x weekly

## 2019-05-09 ENCOUNTER — Encounter: Payer: Self-pay | Admitting: Physical Therapy

## 2019-05-13 ENCOUNTER — Ambulatory Visit: Payer: Medicare Other | Admitting: Physical Therapy

## 2019-05-13 ENCOUNTER — Encounter: Payer: Self-pay | Admitting: Physical Therapy

## 2019-05-13 ENCOUNTER — Other Ambulatory Visit: Payer: Self-pay

## 2019-05-13 DIAGNOSIS — R2689 Other abnormalities of gait and mobility: Secondary | ICD-10-CM

## 2019-05-13 DIAGNOSIS — M6281 Muscle weakness (generalized): Secondary | ICD-10-CM

## 2019-05-13 DIAGNOSIS — R269 Unspecified abnormalities of gait and mobility: Secondary | ICD-10-CM

## 2019-05-13 NOTE — Therapy (Signed)
[place] [facility] [address] [place], [place], [address] Phone: [phone]   Fax:  [phone]  Physical Therapy Treatment  Patient Details  Name: [name] MRN: [id number] Date of Birth: 12/14/1924 Referring Provider (PT): Dr. Melrose Nakayama   Encounter Date: 05/13/2019  PT End of Session - 05/13/19 1206    Visit Number  8    Number of Visits  14    Date for PT Re-Evaluation  06/03/19    Authorization - Visit Number  3    Authorization - Number of Visits  10    PT Start Time  1109    PT Stop Time  1157    PT Time Calculation (min)  48 min    Equipment Utilized During Treatment  Gait belt    Activity Tolerance  Patient tolerated treatment well    Behavior During Therapy  [facility] for tasks assessed/performed       Past Medical History:  Diagnosis Date  . Arthritis   . Asthma   . Atrial fibrillation ([place])   . Cataract   . Eczema   . GERD (gastroesophageal reflux disease)   . Gout of wrist   . Heart murmur   . Pacemaker   . Pseudogout of ankle or foot   . Thyroid disease   . TIA (transient ischemic attack)     Past Surgical History:  Procedure Laterality Date  . ABLATION    . EYE SURGERY    . foot sugery    . HIP ARTHROPLASTY     right  . PACEMAKER INSERTION      There were no vitals filed for this visit.  Subjective Assessment - 05/13/19 1205    Subjective  Pt. reports that she walked 3 miles in her backyard yesterday with no problem. Pt. reports no falls since previous treatment. Pt. reports some muscle soreness in the R hip after strengthening exercises last tx.    Patient is accompained by:  Family member    Limitations  Lifting;Standing;Walking;House hold activities    Patient Stated Goals  Improve balance/ gait/ decrease fall risk.    Currently in Pain?  No/denies      Neuromuscular Re-Education Standing on Airex pad with reaching cone activities 7" away in //-bars X 2  Marching on Airex pad 2 min Side  stepping in //-bars (decreasing hand support to no hands) Tandem Stance in //-bars 1 min each leg  Double leg squats with hand support 2 x 10   TheraEx NuStep 10' Level 3   Seated resisted hip abduction RTB 2 X 10  Seated hip flexion with 1.5# weights 2 x 30 alternating  Seated LAQ with 1.5# weights 2 x 10 with 3 sec holds alternating     PT Long Term Goals - 05/07/19 0856      PT LONG TERM GOAL #1   Title  Pt. will increase FOTO to 60 to improve functional mobility.    Baseline  Initial FOTO: 57.    Time  4    Period  Weeks    Status  On-going    Target Date  06/03/19      PT LONG TERM GOAL #2   Title  Pt. will increase B LE/hip flexion strength 1/2 muscle grade to improve gait/ balance/ safety.    Baseline  B LE muscle strength grossly 5/5 MMT except hip flexion 4/5 MMT (previous R hip surgery)    Time  4    Period  Weeks    Status  On-going    Target Date  06/03/19      PT LONG TERM GOAL #3   Title  Pt. will increase Berg balance test to >45/56 to promote safety/ decrease fall risk.    Baseline  Berg balance test:  39/56 (signifcant fall risk).  1/11: 42/56    Time  4    Period  Weeks    Status  Partially Met    Target Date  06/03/19      PT LONG TERM GOAL #4   Title  Pt. will demonstrate consistent 2-point gait pattern with use of SPC to decrease fall risk/ promote safety.    Baseline  Pt. ambulates with inconsistent gait pattern with/ without use of SPC    Time  4    Period  Weeks    Status  On-going    Target Date  06/03/19          Plan - 05/13/19 1209    Clinical Impression Statement  Pt. demonstrates improvements in ambulation in the //-bars with decreased hand support (one-hand, fingertips, and no hands). Pt. ambulates with a narrow BOS and shortened stride length with usage of SPC. Pt. has difficulty with tandem stance R foot in front > L foot in front and can maintain balance without UE support for 5 sec. Pt. requires verbal postural cueing during gait  and balance activities. Pt. struggles with controlled stand to sit and would benefit from continued LE strengthening exercises.    Personal Factors and Comorbidities  Age    Stability/Clinical Decision Making  Evolving/Moderate complexity    Clinical Decision Making  Moderate    Rehab Potential  Fair    PT Frequency  2x / week    PT Duration  4 weeks    PT Treatment/Interventions  ADLs/Self Care Home Management;Gait training;Stair training;Functional mobility training;Neuromuscular re-education;Balance training;Therapeutic exercise;Therapeutic activities;Patient/family education;Manual techniques    PT Next Visit Plan  Progress LE Strengthening/Balance    PT Home Exercise Plan  Access Code: [email or web address]       Patient will benefit from skilled therapeutic intervention in order to improve the following deficits and impairments:  Abnormal gait, Decreased balance, Decreased endurance, Decreased mobility, Difficulty walking, Dizziness, Decreased activity tolerance, Decreased coordination, Decreased strength, Postural dysfunction  Visit Diagnosis: Gait difficulty  Muscle weakness (generalized)  Balance problems     Problem List Patient Active Problem List   Diagnosis Date Noted  . Leukocytosis 06/29/2016  . Hypogammaglobulinemia ([place]) 06/29/2016  . Temporary cerebral vascular dysfunction 02/28/2014  . DD (diverticular disease) 02/28/2014  . Allergic rhinitis 02/28/2014  . Allergy status to sulfonamides 02/28/2014  . History of anticoagulant therapy 02/28/2014  . Arthritis 02/28/2014  . Airway hyperreactivity 02/28/2014  . A-fib ([place]) 02/28/2014  . Bilateral cataracts 02/28/2014  . Dermatitis, eczematoid 02/28/2014  . Allergy to environmental factors 02/28/2014  . Acid reflux 02/28/2014  . Polypharmacy 02/28/2014  . HLD (hyperlipidemia) 02/28/2014  . Adult hypothyroidism 02/28/2014  . BP (high blood pressure) 02/28/2014  . Arthritis, degenerative 02/28/2014  . Degenerative  arthritis of hip 10/09/2013  . 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 Neck rigid 05/30/2013  . 1st degree AV block 10/10/2012  . History of biliary T-tube placement 06/14/2012  . History of cardioversion 06/05/2012  . Avascular necrosis of femoral head ([place]) 06/01/2012  . Lumbar radiculopathy 06/01/2012  . DDD (degenerative disc disease), lumbar 06/01/2012  . H/O cardiac catheterization 10/20/2009   Pura Spice, PT,  DPT # [id number] Andrey [place], SPT 05/13/2019, 12:47 PM  [facility] [address] [place], [place], [address] Phone: [phone]   Fax:  [phone]  Name: [name] MRN: [id number] Date of Birth: [birth year]

## 2019-05-15 ENCOUNTER — Other Ambulatory Visit: Payer: Self-pay

## 2019-05-15 ENCOUNTER — Ambulatory Visit: Payer: Medicare Other | Admitting: Physical Therapy

## 2019-05-15 ENCOUNTER — Encounter: Payer: Self-pay | Admitting: Physical Therapy

## 2019-05-15 DIAGNOSIS — R269 Unspecified abnormalities of gait and mobility: Secondary | ICD-10-CM | POA: Diagnosis not present

## 2019-05-15 DIAGNOSIS — R2689 Other abnormalities of gait and mobility: Secondary | ICD-10-CM

## 2019-05-15 DIAGNOSIS — M6281 Muscle weakness (generalized): Secondary | ICD-10-CM

## 2019-05-15 NOTE — Therapy (Signed)
[place] [facility] [address] [place], [place], [address] Phone: [phone]   Fax:  [phone]  Physical Therapy Treatment  Patient Details  Name: [name] MRN: [id number] Date of Birth: [birth year] Referring Provider (PT): Dr. Melrose Nakayama   Encounter Date: 05/15/2019  PT End of Session - 05/15/19 1328    Visit Number  9    Number of Visits  14    Date for PT Re-Evaluation  06/03/19    Authorization - Visit Number  4    Authorization - Number of Visits  10    PT Start Time  3254    PT Stop Time  1116    PT Time Calculation (min)  61 min    Equipment Utilized During Treatment  Gait belt    Activity Tolerance  Patient tolerated treatment well    Behavior During Therapy  [facility] for tasks assessed/performed       Past Medical History:  Diagnosis Date  . Arthritis   . Asthma   . Atrial fibrillation ([place])   . Cataract   . Eczema   . GERD (gastroesophageal reflux disease)   . Gout of wrist   . Heart murmur   . Pacemaker   . Pseudogout of ankle or foot   . Thyroid disease   . TIA (transient ischemic attack)     Past Surgical History:  Procedure Laterality Date  . ABLATION    . EYE SURGERY    . foot sugery    . HIP ARTHROPLASTY     right  . PACEMAKER INSERTION      There were no vitals filed for this visit.  Subjective Assessment - 05/15/19 1321    Subjective  Pt. reports that she almost fell backward when standing up from the toilet at her home. Pt. states no increased pain at her hips and knees since the last session.    Patient is accompained by:  Family member    Limitations  Lifting;Standing;Walking;House hold activities    Patient Stated Goals  Improve balance/ gait/ decrease fall risk.    Currently in Pain?  No/denies         Therex:  Sit to stand 10 reps x 3 sets at blue mat table 20 inches height, SBA/CGA Sit to stand with weight shifting and step one leg in the front at a time and maintain balance  for 3 s during stand phase, 5 reps x 2 sets each side, SBA/CGA   Neuro:  Obstacle course (marching on airex, step over yoga blocks) 4 laps x 1 set SBA/CGA     PT Long Term Goals - 05/07/19 0856      PT LONG TERM GOAL #1   Title  Pt. will increase FOTO to 60 to improve functional mobility.    Baseline  Initial FOTO: 57.    Time  4    Period  Weeks    Status  On-going    Target Date  06/03/19      PT LONG TERM GOAL #2   Title  Pt. will increase B LE/hip flexion strength 1/2 muscle grade to improve gait/ balance/ safety.    Baseline  B LE muscle strength grossly 5/5 MMT except hip flexion 4/5 MMT (previous R hip surgery)    Time  4    Period  Weeks    Status  On-going    Target Date  06/03/19      PT LONG TERM GOAL #3  Title  Pt. will increase Berg balance test to >45/56 to promote safety/ decrease fall risk.    Baseline  Berg balance test:  39/56 (signifcant fall risk).  1/11: 42/56    Time  4    Period  Weeks    Status  Partially Met    Target Date  06/03/19      PT LONG TERM GOAL #4   Title  Pt. will demonstrate consistent 2-point gait pattern with use of SPC to decrease fall risk/ promote safety.    Baseline  Pt. ambulates with inconsistent gait pattern with/ without use of SPC    Time  4    Period  Weeks    Status  On-going    Target Date  06/03/19            Plan - 05/15/19 1458    Clinical Impression Statement  Pt. requires minimal rest breaks during today's session. Pt. is not able to perform sit to stand at a non-handle chair with 17.5 inches height. Pt. demonstrates improved LE strength by performing sit to stand at blue mat table at 20 inches height in a control manner for 10 reps x 3 sets with minimal breaks between sets. Pt. demonstrates increased confidence in her balance by performing obstacle course within //- bars with less hesitation with only one hand for maintaining balance. Pt. has increased pain at her L hip during standing weight shifting with  one leg in the front but is able to do it with one hand holds on the bar with no increased pain. Pt. will benefit from continued functional LE strengthening exercises, dynamic balance training to decrease fall risks during tolieting and increase gait independence.    Personal Factors and Comorbidities  Age    Stability/Clinical Decision Making  Evolving/Moderate complexity    Clinical Decision Making  Moderate    Rehab Potential  Fair    PT Frequency  2x / week    PT Duration  4 weeks    PT Treatment/Interventions  ADLs/Self Care Home Management;Gait training;Stair training;Functional mobility training;Neuromuscular re-education;Balance training;Therapeutic exercise;Therapeutic activities;Patient/family education;Manual techniques    PT Next Visit Plan  progress sit to stand (lower down blue mat table); cooperate more standing therex for hip strengthening; continues exercises with weight shifting and one foot step forward.    PT Home Exercise Plan  Access Code: [email or web address]    Consulted and Agree with Plan of Care  Patient       Patient will benefit from skilled therapeutic intervention in order to improve the following deficits and impairments:  Abnormal gait, Decreased balance, Decreased endurance, Decreased mobility, Difficulty walking, Dizziness, Decreased activity tolerance, Decreased coordination, Decreased strength, Postural dysfunction  Visit Diagnosis: Gait difficulty  Muscle weakness (generalized)  Balance problems     Problem List Patient Active Problem List   Diagnosis Date Noted  . Leukocytosis 06/29/2016  . Hypogammaglobulinemia ([place]) 06/29/2016  . Temporary cerebral vascular dysfunction 02/28/2014  . DD (diverticular disease) 02/28/2014  . Allergic rhinitis 02/28/2014  . Allergy status to sulfonamides 02/28/2014  . History of anticoagulant therapy 02/28/2014  . Arthritis 02/28/2014  . Airway hyperreactivity 02/28/2014  . A-fib ([place]) 02/28/2014  . Bilateral cataracts  02/28/2014  . Dermatitis, eczematoid 02/28/2014  . Allergy to environmental factors 02/28/2014  . Acid reflux 02/28/2014  . Polypharmacy 02/28/2014  . HLD (hyperlipidemia) 02/28/2014  . Adult hypothyroidism 02/28/2014  . BP (high blood pressure) 02/28/2014  . Arthritis, degenerative 02/28/2014  . Degenerative arthritis of hip 10/09/2013  .  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 Neck rigid 05/30/2013  . 1st degree AV block 10/10/2012  . History of biliary T-tube placement 06/14/2012  . History of cardioversion 06/05/2012  . Avascular necrosis of femoral head ([place]) 06/01/2012  . Lumbar radiculopathy 06/01/2012  . DDD (degenerative disc disease), lumbar 06/01/2012  . H/O cardiac catheterization 10/20/2009   Pura Spice, PT, DPT # [place], SPT 05/16/2019, 10:49 AM  [place] [facility] [address] [place], [place], [address] Phone: [phone]   Fax:  [phone]  Name: [name] MRN: [id number] Date of Birth: Dec 05, 1924

## 2019-05-16 ENCOUNTER — Encounter: Payer: Self-pay | Admitting: Physical Therapy

## 2019-05-20 ENCOUNTER — Ambulatory Visit: Payer: Medicare Other | Admitting: Physical Therapy

## 2019-05-22 ENCOUNTER — Other Ambulatory Visit: Payer: Self-pay

## 2019-05-22 ENCOUNTER — Ambulatory Visit: Payer: Medicare Other | Admitting: Physical Therapy

## 2019-05-22 DIAGNOSIS — R269 Unspecified abnormalities of gait and mobility: Secondary | ICD-10-CM | POA: Diagnosis not present

## 2019-05-22 DIAGNOSIS — M6281 Muscle weakness (generalized): Secondary | ICD-10-CM

## 2019-05-22 DIAGNOSIS — R2689 Other abnormalities of gait and mobility: Secondary | ICD-10-CM

## 2019-05-22 NOTE — Therapy (Signed)
[facility] [address] [place], [place], [address] Phone: [phone]   Fax:  [phone]  Physical Therapy Treatment  Patient Details  Name: [name] MRN: [id number] Date of Birth: 1925-01-25 Referring Provider (PT): Dr. Melrose Nakayama   Encounter Date: 05/22/2019  PT End of Session - 05/22/19 1128    Visit Number  10    Number of Visits  14    Date for PT Re-Evaluation  06/03/19    Authorization - Visit Number  5    Authorization - Number of Visits  10    PT Start Time  8185    PT Stop Time  1109    PT Time Calculation (min)  55 min    Equipment Utilized During Treatment  Gait belt    Activity Tolerance  Patient tolerated treatment well    Behavior During Therapy  [facility] for tasks assessed/performed       Past Medical History:  Diagnosis Date  . Arthritis   . Asthma   . Atrial fibrillation ([place])   . Cataract   . Eczema   . GERD (gastroesophageal reflux disease)   . Gout of wrist   . Heart murmur   . Pacemaker   . Pseudogout of ankle or foot   . Thyroid disease   . TIA (transient ischemic attack)     Past Surgical History:  Procedure Laterality Date  . ABLATION    . EYE SURGERY    . foot sugery    . HIP ARTHROPLASTY     right  . PACEMAKER INSERTION      There were no vitals filed for this visit.  Subjective Assessment - 05/22/19 1045    Subjective  Pt has ICD generator change procedure on 05/29/2019 and next PT session is on 06/04/2019. Pt. reports she has trouble falling asleep during the past weekend and on Monday. Pt cancelled the past Monday session (05/20/2019) due to fatigue and poor quality of sleep. Pt. states she is doing better with the sleep after the past monday but still feel fatigue prior to today's session.  No recent falls reported.    Patient is accompained by:  Family member    Limitations  Lifting;Standing;Walking;House hold activities    Patient Stated Goals  Improve balance/ gait/  decrease fall risk.    Currently in Pain?  No/denies         Therex:  Nustep L1 10 min Blue mat table sit to stand at 20.5 inches height with 10 s standing in between reps, verbal cueing for posture correction, CGA 20 reps x 1 set  Neuromuscular re-education:  (Step over airex, sanding marching on airex, step over one yoga block) x 6 laps within //- bars with one hand/two fingers for UE assist, CGA with verbal cueing for posture correction Tandem forward walking x 3 laps within //-bars with one hand/two fingers for UE assist, CGA with verbal cueing for posture correction Foot tapping forward/lateral/backward 10 reps x 1 set on each side with one hand/two fingers for UE assist, CGA with verbal cueing for posture correction Standing with reaching (fishing activities) x 3 min, SBA/CGA    PT Long Term Goals - 05/07/19 0856      PT LONG TERM GOAL #1   Title  Pt. will increase FOTO to 60 to improve functional mobility.    Baseline  Initial FOTO: 57.    Time  4    Period  Weeks    Status  On-going    Target Date  06/03/19      PT LONG TERM GOAL #2   Title  Pt. will increase B LE/hip flexion strength 1/2 muscle grade to improve gait/ balance/ safety.    Baseline  B LE muscle strength grossly 5/5 MMT except hip flexion 4/5 MMT (previous R hip surgery)    Time  4    Period  Weeks    Status  On-going    Target Date  06/03/19      PT LONG TERM GOAL #3   Title  Pt. will increase Berg balance test to >45/56 to promote safety/ decrease fall risk.    Baseline  Berg balance test:  39/56 (signifcant fall risk).  1/11: 42/56    Time  4    Period  Weeks    Status  Partially Met    Target Date  06/03/19      PT LONG TERM GOAL #4   Title  Pt. will demonstrate consistent 2-point gait pattern with use of SPC to decrease fall risk/ promote safety.    Baseline  Pt. ambulates with inconsistent gait pattern with/ without use of SPC    Time  4    Period  Weeks    Status  On-going    Target  Date  06/03/19         Plan - 05/22/19 1130    Clinical Impression Statement  Pt. presents with fatigue due to poor quality of sleep during the past several days and today's session is modified accordingly based on pts fatigue level and tolerance. Pt. demonstrates stand to sit in a non-control manner but is able to correct it with verbal cueing. Pt. has certain moments of off balance during standing phase of sit to stand but is able to self correct after verbal cueing for more trunk forward lean. Pt. is able to perform sit to stand at blue mat table at 20.5 inches height 20 reps x 1 set with 20 s of rest breaks with CGA. Pt. requires at least one hand or two fingers for UE assist to maintain balance during tandem walking/forward marching and foot tapping in single leg stance with CGA. Pt. tolerates well with reaching activities (fishing exercise) without LOB with SBA/CGA.    Personal Factors and Comorbidities  Age;Other    Stability/Clinical Decision Making  Evolving/Moderate complexity    Clinical Decision Making  Moderate    Rehab Potential  Fair    PT Frequency  2x / week    PT Duration  4 weeks    PT Treatment/Interventions  ADLs/Self Care Home Management;Gait training;Stair training;Functional mobility training;Neuromuscular re-education;Balance training;Therapeutic exercise;Therapeutic activities;Patient/family education;Manual techniques    PT Next Visit Plan  Re-cert/reassessment; progress sit to stand (lower down blue mat table to 19.5-19 inches); cooperate more standing therex for hip strengthening; continues exercises with weight shifting and one foot step forward. Progress fishing exercise.    PT Home Exercise Plan  Access Code: [email or web address]    Consulted and Agree with Plan of Care  Patient       Patient will benefit from skilled therapeutic intervention in order to improve the following deficits and impairments:  Abnormal gait, Decreased balance, Decreased endurance, Decreased mobility,  Difficulty walking, Dizziness, Decreased activity tolerance, Decreased coordination, Decreased strength, Postural dysfunction  Visit Diagnosis: Gait difficulty  Muscle weakness (generalized)  Balance problems     Problem List Patient Active Problem List   Diagnosis Date Noted  . Leukocytosis 06/29/2016  . Hypogammaglobulinemia (  [place]) 06/29/2016  . Temporary cerebral vascular dysfunction 02/28/2014  . DD (diverticular disease) 02/28/2014  . Allergic rhinitis 02/28/2014  . Allergy status to sulfonamides 02/28/2014  . History of anticoagulant therapy 02/28/2014  . Arthritis 02/28/2014  . Airway hyperreactivity 02/28/2014  . A-fib ([place]) 02/28/2014  . Bilateral cataracts 02/28/2014  . Dermatitis, eczematoid 02/28/2014  . Allergy to environmental factors 02/28/2014  . Acid reflux 02/28/2014  . Polypharmacy 02/28/2014  . HLD (hyperlipidemia) 02/28/2014  . Adult hypothyroidism 02/28/2014  . BP (high blood pressure) 02/28/2014  . Arthritis, degenerative 02/28/2014  . Degenerative arthritis of hip 10/09/2013  . 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 Neck rigid 05/30/2013  . 1st degree AV block 10/10/2012  . History of biliary T-tube placement 06/14/2012  . History of cardioversion 06/05/2012  . Avascular necrosis of femoral head ([place]) 06/01/2012  . Lumbar radiculopathy 06/01/2012  . DDD (degenerative disc disease), lumbar 06/01/2012  . H/O cardiac catheterization 10/20/2009   Pura Spice, PT, DPT # [place], SPT 05/23/2019, 9:19 AM  [facility] [address] [place], [place], [address] Phone: [phone]   Fax:  [phone]  Name: [name] MRN: [id number] Date of Birth: [birth year]

## 2019-05-23 ENCOUNTER — Encounter: Payer: Self-pay | Admitting: Physical Therapy

## 2019-05-24 ENCOUNTER — Other Ambulatory Visit
Admission: RE | Admit: 2019-05-24 | Discharge: 2019-05-24 | Disposition: A | Discharge status: Home / Self Care | Payer: Medicare Other | Source: Ambulatory Visit | Attending: Cardiology | Admitting: Cardiology

## 2019-05-24 ENCOUNTER — Other Ambulatory Visit: Payer: Self-pay

## 2019-05-24 DIAGNOSIS — Z01812 Encounter for preprocedural laboratory examination: Secondary | ICD-10-CM | POA: Insufficient documentation

## 2019-05-24 DIAGNOSIS — Z20822 Contact with and (suspected) exposure to covid-19: Secondary | ICD-10-CM | POA: Insufficient documentation

## 2019-05-24 LAB — SARS CORONAVIRUS 2 (TAT 6-24 HRS): SARS Coronavirus 2: NEGATIVE

## 2019-05-27 ENCOUNTER — Other Ambulatory Visit: Admission: RE | Admit: 2019-05-27 | Payer: Medicare Other | Source: Ambulatory Visit

## 2019-05-28 MED ORDER — SODIUM CHLORIDE 0.9 % IV SOLN
80.0000 mg | INTRAVENOUS | Status: DC
  Filled 2019-05-28: qty 2

## 2019-05-29 ENCOUNTER — Encounter: Payer: Self-pay | Admitting: Cardiology

## 2019-05-29 ENCOUNTER — Ambulatory Visit: Payer: Medicare Other

## 2019-05-29 ENCOUNTER — Encounter
Admission: RE | Disposition: A | Discharge status: Home / Self Care | Payer: Self-pay | Source: Ambulatory Visit | Attending: Cardiology

## 2019-05-29 ENCOUNTER — Ambulatory Visit
Admission: RE | Admit: 2019-05-29 | Discharge: 2019-05-29 | Disposition: A | Discharge status: Home / Self Care | Payer: Medicare Other | Source: Ambulatory Visit | Attending: Cardiology | Admitting: Cardiology

## 2019-05-29 ENCOUNTER — Other Ambulatory Visit: Payer: Self-pay

## 2019-05-29 DIAGNOSIS — E039 Hypothyroidism, unspecified: Secondary | ICD-10-CM | POA: Insufficient documentation

## 2019-05-29 DIAGNOSIS — Z8249 Family history of ischemic heart disease and other diseases of the circulatory system: Secondary | ICD-10-CM | POA: Diagnosis not present

## 2019-05-29 DIAGNOSIS — I10 Essential (primary) hypertension: Secondary | ICD-10-CM | POA: Diagnosis not present

## 2019-05-29 DIAGNOSIS — Z8673 Personal history of transient ischemic attack (TIA), and cerebral infarction without residual deficits: Secondary | ICD-10-CM | POA: Insufficient documentation

## 2019-05-29 DIAGNOSIS — M3500 Sicca syndrome, unspecified: Secondary | ICD-10-CM | POA: Insufficient documentation

## 2019-05-29 DIAGNOSIS — Z7901 Long term (current) use of anticoagulants: Secondary | ICD-10-CM | POA: Diagnosis not present

## 2019-05-29 DIAGNOSIS — Z79890 Hormone replacement therapy (postmenopausal): Secondary | ICD-10-CM | POA: Diagnosis not present

## 2019-05-29 DIAGNOSIS — Z881 Allergy status to other antibiotic agents status: Secondary | ICD-10-CM | POA: Insufficient documentation

## 2019-05-29 DIAGNOSIS — J449 Chronic obstructive pulmonary disease, unspecified: Secondary | ICD-10-CM | POA: Diagnosis not present

## 2019-05-29 DIAGNOSIS — I495 Sick sinus syndrome: Secondary | ICD-10-CM | POA: Diagnosis not present

## 2019-05-29 DIAGNOSIS — Z96641 Presence of right artificial hip joint: Secondary | ICD-10-CM | POA: Diagnosis not present

## 2019-05-29 DIAGNOSIS — Z7951 Long term (current) use of inhaled steroids: Secondary | ICD-10-CM | POA: Diagnosis not present

## 2019-05-29 DIAGNOSIS — H269 Unspecified cataract: Secondary | ICD-10-CM | POA: Insufficient documentation

## 2019-05-29 DIAGNOSIS — M1990 Unspecified osteoarthritis, unspecified site: Secondary | ICD-10-CM | POA: Diagnosis not present

## 2019-05-29 DIAGNOSIS — Z8261 Family history of arthritis: Secondary | ICD-10-CM | POA: Insufficient documentation

## 2019-05-29 DIAGNOSIS — E7800 Pure hypercholesterolemia, unspecified: Secondary | ICD-10-CM | POA: Diagnosis not present

## 2019-05-29 DIAGNOSIS — Z888 Allergy status to other drugs, medicaments and biological substances status: Secondary | ICD-10-CM | POA: Insufficient documentation

## 2019-05-29 DIAGNOSIS — Z45010 Encounter for checking and testing of cardiac pacemaker pulse generator [battery]: Secondary | ICD-10-CM | POA: Insufficient documentation

## 2019-05-29 DIAGNOSIS — Z882 Allergy status to sulfonamides status: Secondary | ICD-10-CM | POA: Insufficient documentation

## 2019-05-29 DIAGNOSIS — Z79899 Other long term (current) drug therapy: Secondary | ICD-10-CM | POA: Diagnosis not present

## 2019-05-29 DIAGNOSIS — I480 Paroxysmal atrial fibrillation: Secondary | ICD-10-CM | POA: Diagnosis not present

## 2019-05-29 DIAGNOSIS — Z8349 Family history of other endocrine, nutritional and metabolic diseases: Secondary | ICD-10-CM | POA: Insufficient documentation

## 2019-05-29 DIAGNOSIS — Z825 Family history of asthma and other chronic lower respiratory diseases: Secondary | ICD-10-CM | POA: Insufficient documentation

## 2019-05-29 DIAGNOSIS — E782 Mixed hyperlipidemia: Secondary | ICD-10-CM | POA: Diagnosis not present

## 2019-05-29 DIAGNOSIS — K219 Gastro-esophageal reflux disease without esophagitis: Secondary | ICD-10-CM | POA: Insufficient documentation

## 2019-05-29 DIAGNOSIS — Z823 Family history of stroke: Secondary | ICD-10-CM | POA: Diagnosis not present

## 2019-05-29 DIAGNOSIS — Z885 Allergy status to narcotic agent status: Secondary | ICD-10-CM | POA: Insufficient documentation

## 2019-05-29 HISTORY — PX: IMPLANTABLE CARDIOVERTER DEFIBRILLATOR (ICD) GENERATOR CHANGE: SHX5469

## 2019-05-29 LAB — SURGICAL PCR SCREEN
MRSA, PCR: NEGATIVE
Staphylococcus aureus: NEGATIVE

## 2019-05-29 SURGERY — ICD GENERATOR CHANGE
Anesthesia: General | Laterality: Left

## 2019-05-29 MED ORDER — PHENYLEPHRINE HCL (PRESSORS) 10 MG/ML IV SOLN
INTRAVENOUS | Status: DC | PRN
  Administered 2019-05-29 (×2): 100 ug via INTRAVENOUS

## 2019-05-29 MED ORDER — PROPOFOL 500 MG/50ML IV EMUL
INTRAVENOUS | Status: DC | PRN
  Administered 2019-05-29: 40 ug/kg/min via INTRAVENOUS

## 2019-05-29 MED ORDER — ACETAMINOPHEN 325 MG PO TABS: 325.0000 mg | ORAL_TABLET | ORAL | Status: DC | PRN

## 2019-05-29 MED ORDER — FENTANYL CITRATE (PF) 100 MCG/2ML IJ SOLN
INTRAMUSCULAR | Status: AC
  Filled 2019-05-29: qty 2

## 2019-05-29 MED ORDER — PROPOFOL 10 MG/ML IV BOLUS
INTRAVENOUS | Status: AC
  Filled 2019-05-29: qty 20

## 2019-05-29 MED ORDER — CEPHALEXIN 250 MG PO CAPS: 250.0000 mg | ORAL_CAPSULE | Freq: Two times a day (BID) | ORAL | 0 refills | Status: DC

## 2019-05-29 MED ORDER — FENTANYL CITRATE (PF) 100 MCG/2ML IJ SOLN
INTRAMUSCULAR | Status: DC | PRN
  Administered 2019-05-29 (×3): 12.5 ug via INTRAVENOUS

## 2019-05-29 MED ORDER — PROPOFOL 10 MG/ML IV BOLUS
INTRAVENOUS | Status: DC | PRN
  Administered 2019-05-29: 30 mg via INTRAVENOUS
  Administered 2019-05-29 (×2): 20 mg via INTRAVENOUS

## 2019-05-29 MED ORDER — LACTATED RINGERS IV SOLN: INTRAVENOUS | Status: DC

## 2019-05-29 MED ORDER — CEFAZOLIN SODIUM-DEXTROSE 2-4 GM/100ML-% IV SOLN
2.0000 g | INTRAVENOUS | Status: AC
  Administered 2019-05-29: 2 g via INTRAVENOUS

## 2019-05-29 MED ORDER — ONDANSETRON HCL 4 MG/2ML IJ SOLN
INTRAMUSCULAR | Status: DC | PRN
  Administered 2019-05-29: 4 mg via INTRAVENOUS

## 2019-05-29 MED ORDER — CEFAZOLIN SODIUM-DEXTROSE 2-4 GM/100ML-% IV SOLN
INTRAVENOUS | Status: AC
  Filled 2019-05-29: qty 100

## 2019-05-29 MED ORDER — LIDOCAINE 1 % OPTIME INJ - NO CHARGE
INTRAMUSCULAR | Status: DC | PRN
  Administered 2019-05-29: 13:00:00 25 mL

## 2019-05-29 MED ORDER — ONDANSETRON HCL 4 MG/2ML IJ SOLN: 4.0000 mg | Freq: Four times a day (QID) | INTRAMUSCULAR | Status: DC | PRN

## 2019-05-29 MED ORDER — ONDANSETRON HCL 4 MG/2ML IJ SOLN
INTRAMUSCULAR | Status: AC
  Filled 2019-05-29: qty 2

## 2019-05-29 MED ORDER — GENTAMICIN SULFATE 40 MG/ML IJ SOLN
INTRAMUSCULAR | Status: AC
  Filled 2019-05-29: qty 2

## 2019-05-29 SURGICAL SUPPLY — 27 items
BAG DECANTER FOR FLEXI CONT (MISCELLANEOUS) ×3 IMPLANT
BLADE PHOTON ILLUMINATED (MISCELLANEOUS) IMPLANT
CABLE SURG 12 DISP A/V CHANNEL (MISCELLANEOUS) ×3 IMPLANT
CANISTER SUCT 1200ML W/VALVE (MISCELLANEOUS) ×3 IMPLANT
CHLORAPREP W/TINT 26 (MISCELLANEOUS) ×3 IMPLANT
COVER LIGHT HANDLE STERIS (MISCELLANEOUS) ×6 IMPLANT
COVER MAYO STAND REUSABLE (DRAPES) ×3 IMPLANT
COVER WAND RF STERILE (DRAPES) ×3 IMPLANT
DRSG TEGADERM 4X4.75 (GAUZE/BANDAGES/DRESSINGS) ×3 IMPLANT
DRSG TELFA 4X3 1S NADH ST (GAUZE/BANDAGES/DRESSINGS) ×3 IMPLANT
ELECT REM PT RETURN 9FT ADLT (ELECTROSURGICAL) ×3
ELECTRODE REM PT RTRN 9FT ADLT (ELECTROSURGICAL) ×1 IMPLANT
GLOVE BIO SURGEON STRL SZ7.5 (GLOVE) ×3 IMPLANT
GLOVE BIO SURGEON STRL SZ8 (GLOVE) ×3 IMPLANT
GOWN STRL REUS W/ TWL LRG LVL3 (GOWN DISPOSABLE) ×1 IMPLANT
GOWN STRL REUS W/ TWL XL LVL3 (GOWN DISPOSABLE) ×1 IMPLANT
GOWN STRL REUS W/TWL LRG LVL3 (GOWN DISPOSABLE) ×2
GOWN STRL REUS W/TWL XL LVL3 (GOWN DISPOSABLE) ×2
KIT TURNOVER KIT A (KITS) ×3 IMPLANT
KIT WRENCH (KITS) ×3 IMPLANT
MARKER SKIN DUAL TIP RULER LAB (MISCELLANEOUS) ×3 IMPLANT
PACEMAKER PRCT MRI CRTP W1TR01 (Pacemaker) ×1 IMPLANT
PACK PACE INSERTION (MISCELLANEOUS) ×3 IMPLANT
PAD ONESTEP ZOLL R SERIES ADT (MISCELLANEOUS) ×3 IMPLANT
PIN PLUG IS-1 DEFIB (PIN) ×3 IMPLANT
PPM PRECEPTA MRI CRT-P W1TR01 (Pacemaker) ×3 IMPLANT
STRAP SAFETY 5IN WIDE (MISCELLANEOUS) ×6 IMPLANT

## 2019-05-29 NOTE — Anesthesia Preprocedure Evaluation (Signed)
Anesthesia Evaluation  Patient identified by MRN, date of birth, ID band Patient awake    Reviewed: Allergy & Precautions, NPO status , Patient's Chart, lab work & pertinent test results  History of Anesthesia Complications Negative for: history of anesthetic complications  Airway Mallampati: II  TM Distance: >3 FB Neck ROM: Full    Dental  (+) Lower Dentures   Pulmonary asthma ,    breath sounds clear to auscultation- rhonchi (-) wheezing      Cardiovascular hypertension, (-) CAD, (-) Past MI, (-) Cardiac Stents and (-) CABG + dysrhythmias Atrial Fibrillation + pacemaker  Rhythm:Regular Rate:Normal - Systolic murmurs and - Diastolic murmurs    Neuro/Psych TIAnegative psych ROS   GI/Hepatic Neg liver ROS, GERD  ,  Endo/Other  neg diabetesHypothyroidism   Renal/GU negative Renal ROS     Musculoskeletal  (+) Arthritis ,   Abdominal (+) - obese,   Peds  Hematology negative hematology ROS (+)   Anesthesia Other Findings Past Medical History: No date: Arthritis No date: Asthma No date: Atrial fibrillation ([facility]) No date: Cataract No date: Eczema No date: GERD (gastroesophageal reflux disease) No date: Gout of wrist No date: Heart murmur No date: Pacemaker No date: Pseudogout of ankle or foot No date: Thyroid disease No date: TIA (transient ischemic attack)   Reproductive/Obstetrics                             Anesthesia Physical Anesthesia Plan  ASA: III  Anesthesia Plan: General   Post-op Pain Management:    Induction: Intravenous  PONV Risk Score and Plan: 2 and Propofol infusion  Airway Management Planned: Natural Airway  Additional Equipment:   Intra-op Plan:   Post-operative Plan:   Informed Consent: I have reviewed the patients History and Physical, chart, labs and discussed the procedure including the risks, benefits and alternatives for the proposed anesthesia  with the patient or authorized representative who has indicated his/her understanding and acceptance.     Dental advisory given  Plan Discussed with: CRNA and Anesthesiologist  Anesthesia Plan Comments:         Anesthesia Quick Evaluation

## 2019-05-29 NOTE — Anesthesia Procedure Notes (Signed)
Procedure Name: MAC Date/Time: 05/29/2019 12:06 PM Performed by: Jerrye Noble, CRNA Pre-anesthesia Checklist: Patient identified, Emergency Drugs available, Suction available, Patient being monitored and Timeout performed Patient Re-evaluated:Patient Re-evaluated prior to induction Oxygen Delivery Method: Simple face mask Induction Type: IV induction

## 2019-05-29 NOTE — Transfer of Care (Signed)
Immediate Anesthesia Transfer of Care Note  Patient: [name]  Procedure(s) Performed: PACEMAKER CHANGE OUT (Left )  Patient Location: PACU  Anesthesia Type:General  Level of Consciousness: awake, alert  and patient cooperative  Airway & Oxygen Therapy: Patient Spontanous Breathing  Post-op Assessment: Report given to RN and Post -op Vital signs reviewed and stable  Post vital signs: Reviewed and stable  Last Vitals:  Vitals Value Taken Time  BP 125/60 05/29/19 1302  Temp 36.2 C 05/29/19 1302  Pulse 62 05/29/19 1304  Resp 16 05/29/19 1304  SpO2 99 % 05/29/19 1304  Vitals shown include unvalidated device data.  Last Pain:  Vitals:   05/29/19 1302  TempSrc:   PainSc: 0-No pain         Complications: No apparent anesthesia complications

## 2019-05-29 NOTE — Anesthesia Postprocedure Evaluation (Signed)
Anesthesia Post Note  Patient: [name]  Procedure(s) Performed: PACEMAKER CHANGE OUT (Left )  Patient location during evaluation: PACU Anesthesia Type: General Level of consciousness: awake and alert and oriented Pain management: pain level controlled Vital Signs Assessment: post-procedure vital signs reviewed and stable Respiratory status: spontaneous breathing, nonlabored ventilation and respiratory function stable Cardiovascular status: blood pressure returned to baseline and stable Postop Assessment: no signs of nausea or vomiting Anesthetic complications: no     Last Vitals:  Vitals:   05/29/19 1332 05/29/19 1359  BP: (!) 145/63 (!) 162/67  Pulse: (!) 57 61  Resp: 16   Temp: 36.6 C (!) 36.3 C  SpO2: 94% 95%    Last Pain:  Vitals:   05/29/19 1359  TempSrc: Temporal  PainSc: 0-No pain                 Teri Diltz

## 2019-05-29 NOTE — H&P (Signed)
Jump to Section ? Discontinued MedicationsDocument InformationEncounter DetailsGoalsImaging ResultsLab ResultsLast Filed Vital SignsMiscellaneous NotesPatient ContactsPatient DemographicsPlan of TreatmentProceduresProgress NotesReason for VisitSocial HistoryVisit Diagnoses Tamsen Meek Encounter Summary, generated on Feb. 03, 2021February 03, 2021 Printout Information  Document Contents Document Received Date Document Source Organization  Office Visit Feb. 03, 2021February 03, 2021 [place]   Patient Demographics - 86 y.o. Female; born [birth year]  Patient Address Communication Language Race / Ethnicity Marital Status  [place], [place] [address] [phone] (Home) [email or web address] English (Preferred) White / Not Hispanic or Latino Widowed   Reason for Visit  Reason Comments  Atrial Fibrillation   Pacer-ICD   Cardiac Valve Problem   Dizziness    Encounter Details  Date Type Department Care Team Description  05/07/2019 Office Visit [facility]  [address]  [place], [place] [address]  [phone]  Flossie Dibble, [place]  [facility]  [place], [place] [address]  [phone]  [phone] (Fax)  Benign essential hypertension (Primary Dx);  Paroxysmal atrial fibrillation (CMS-HCC);  Sinoatrial node dysfunction;  Pre-op testing   Social History - documented as of this encounter Tobacco Use Types Packs/Day Years Used Date  Never Smoker      Smokeless Tobacco: Never Used      Alcohol Use Drinks/Week oz/Week Comments  Yes 7 Shots of liquor  7.0 daily whiskey   Alcohol Habits Answer Date Recorded  How often do you have a drink containing alcohol? Not asked   How many drinks containing alcohol do you have on a typical day when you are drinking? Not asked   How often do you have six or more drinks  on one occasion? Less than monthly 05/04/2017   Sex Assigned at Birth Date Recorded  Not on file    COVID-19 Exposure Response Date Recorded  In the last month, have you been in contact with someone who was confirmed or suspected to have Coronavirus / COVID-19? No / Unsure 05/07/2019 2:16 PM EST   Last Filed Vital Signs - documented in this encounter Vital Sign Reading Time Taken Comments  Blood Pressure - -   Pulse 85 05/07/2019 2:42 PM EST   Temperature - -   Respiratory Rate - -   Oxygen Saturation 92% 05/07/2019 2:42 PM EST   Inhaled Oxygen Concentration - -   Weight 65.3 kg (144 lb) 05/07/2019 2:42 PM EST   Height 160 cm ('5\' 3"'$ ) 05/07/2019 2:42 PM EST   Body Mass Index 25.51 05/07/2019 2:42 PM EST    Progress Notes - documented in this encounter Flossie Dibble, MD - 05/07/2019 2:30 PM EST Formatting of this note might be different from the original. Established Patient Visit   Chief Complaint: Chief Complaint  Patient presents with  . Atrial Fibrillation  . Pacer-ICD  . Cardiac Valve Problem  . Dizziness  Date of Service: 05/07/2019 Date of Birth: 1924/09/08 PCP: Mancel Bale, MD  History of Present Illness: Ms. Pickron is a 86 y.o.female patient  Dizziness The patient has had Acute dizziness over the last 1 months associated with moving from lying to sitting position and moving from sitting to standing position with variable relief. These symptoms appear to be worsening with increased frequency. Other symptoms include lightheaded, dimming vision, confusion, drowsiness, near syncope, weakness, palpitations and unsteadiness. Possible causes include medication side effects  Essential Hypertension The patient has been on medication management listed below for essential hypertension. Reported  average blood pressure readings recently have shown that the blood pressure is stable. The patient has had any side effects of these medications at this time. We  have discussed treatment goals with the patient for which they understand and agree. This includes medication management, lifestyle changes, and diet management. Currently there is no apparent secondary causes of the hypertension at this time. And concerns for metoprolol causing side effects Anticoagulation The patient is currently on anticoagulation for risk reduction in stroke with rhythm disturbances using apixiban without bleeding bruising and/or side effects. The patient understands all risks and benefits of anticoagulation. The patient understands that there are risks of bleeding of multiple sources, and bruising. Currently the benefits of anticoagulation clearly outweigh the risks. The patient will remain on anticoagulation according to the current guideline therapy. The patient agrees to continuation at this time and will follow for any possible future side effects. Mixed Hyperlipidemia The patient has cardiovascular risk factors including High LDL cholesterol and greater than 7.5% 10 year cardiovascular risk score and therefore has been placed on Moderate intensity therapy with atorvastatin (Lipitor) for reduction in LDL and cardiovascular risk for which we have discussed today. Other healthy lifestyle measures have been discussed as well. We have discussed the appropriate treatment goals of the above measures which include a 30% to 50% reduction in LDL levels. A significant component of residual cardiovascular risk is in adequate LDL C lowering in the large majority of patients. Additionally ultralow LDL-C is safe with no apparent downside. The patient has a clear understanding of reasons for lipid management at this time. The patient is currently having no evidence of significant side effects of this medication at this time. Pacemaker Interrogation The patient has had a pacemaker interrogation which has shown that the pacemaker battery has reached end of life. We have had further discussion of  battery change out with all of the risks and benefits. Otherwise the patient has not had any significant side effects or symptoms of the pacemaker wires.  Past Medical and Surgical History  Past Medical History Past Medical History:  Diagnosis Date  . Allergic rhinitis  Rx flonase  . Asthma, moderate, unspecified 09/04/2014  . Atrial fibrillation (CMS-HCC)  paroxysmal-->persistent; tendency toward RVR (rate-dilt/amio), Sx fatigue/DOE/palpitations; refractory to amiodarone/DCCV  . Avascular necrosis of femoral head (CMS-HCC)  . 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 Right shoulder joint positive for calcium pyrophosphate crystals. Followed annually (in September) by Dr. Tawanna Solo of [facility]  . Cataracts, bilateral  . Chronic lumbar radiculopathy  . Closed bicondylar fracture of distal end of right humerus 04/19/2016  . COPD (chronic obstructive pulmonary disease) (CMS-HCC)  . Degenerative disc disease, lumbar  . Diverticulosis  . Eczema, unspecified  Rx topical  . First degree AV block 10/10/2012  PR=24med in sinus rhythm EKG 10/10/2012  . GERD (gastroesophageal reflux disease)  . H/O cardiac catheterization 10/20/2009  luminal irregularities, RCA dominant on cardiac cath 10/20/2009 [employer] H/O echocardiogram 06/04/2012  EF>55%, LA-3.8cm, mild LVH, trivial TR, trileaflet AoValve  . H/O: pneumonia 10/2012  not hospitalized- no CXR confirmed  . History of cardioversion 06/05/2012  DCCV 100J to SR 06/05/2012  . HTN (hypertension)  Off antihypertensives as of 12/05/2016  . Hypercholesterolemia  Rx lipitor 40  . Hypothyroidism  . Intrinsic eczema 07/08/2014  . Osteoarthritis  . Sjogren's syndrome without extraglandular involvement (CMS-HCC) 08/22/2017  Diagnosed by Dr. JKathyrn Sheriff4/29/2019; prescribed pilocarpine 5 mg  . TIA (transient ischemic  attack) 1995  transient visual Sxs   Past Surgical  History She has a past surgical history that includes Wrist surgery; Tear duct surgery (Right, 1990's); heart catheterizaion (2012); revision/relocation pacemaker pocket; Ablation Arrythmia Focus; Cataract extraction; and total hip replacement (Right, 07/25/2014).   Medications and Allergies  Current Medications  Current Outpatient Medications on File Prior to Visit  Medication Sig Dispense Refill  . acetaminophen (TYLENOL) 650 MG ER tablet Take 650 mg by mouth every 8 (eight) hours as needed for Pain.  Marland Kitchen albuterol (VENTOLIN HFA) 90 mcg/actuation inhaler Inhale 2 inhalations into the lungs every 4 (four) hours as needed for Wheezing MEDICALLY NECESSARY 3 inhalation 3  . atorvastatin (LIPITOR) 40 MG tablet Take 1 tablet (40 mg total) by mouth once daily For cholesterol 90 tablet 3  . budesonide-formoteroL (SYMBICORT) 80-4.5 mcg/actuation inhaler Inhale 2 inhalations into the lungs 2 (two) times daily 30.6 g 3  . cholecalciferol (VITAMIN D3) 2,000 unit capsule Take 2,000 Units by mouth once daily.   . clobetasol (CLOBEX) 0.05 % shampoo Apply topically once daily Apply thin film to dry scalp as needed; leave in place for 15 mins, then add water and lather. Rinse thoroughly. 118 mL 2  . clotrimazole-betamethasone (LOTRISONE) 1-0.05 % cream Apply topically 2 (two) times daily as needed 15 g 1  . desonide (DESOWEN) 0.05 % ointment Apply topically 2 (two) times daily as needed 60 g 3  . ELIQUIS 5 mg tablet TAKE 1 TABLET TWICE A DAY 180 tablet 3  . famotidine (PEPCID) 20 MG tablet Take 1 tablet (20 mg total) by mouth 2 (two) times daily as needed for Heartburn 180 tablet 3  . Herbal Supplement Take 1 tablet by mouth once daily Herbal Name: Prevagen  . ipratropium (ATROVENT) 0.06 % nasal spray Place 2 sprays into both nostrils 3 (three) times daily as needed (runny nose) 45 mL 3  . levothyroxine (LEVOXYL) 112 MCG tablet Take on an empty stomach with a glass of water at least 30-60 minutes before  breakfast. 90 tablet 3  . montelukast (SINGULAIR) 10 mg tablet Take 1 tablet (10 mg total) by mouth nightly For allergies 90 tablet 3  . nystatin (MYCOSTATIN) 100,000 unit/mL suspension Swish and swallow 500,000 Units 4 (four) times daily as needed  . olopatadine (PAZEO) 0.7 % ophthalmic solution Place 1 drop into both eyes once daily.  Marland Kitchen omega 3-dha-epa-fish oil (FISH OIL) 1,200 (144-216) mg Cap Take 1 capsule by mouth once daily   No current facility-administered medications on file prior to visit.   Allergies: Ciprofloxacin, Codeine, Colcrys [colchicine], Promethazine, Sulfa (sulfonamide antibiotics), Valtrex [valacyclovir], Other, Advair diskus [fluticasone propion-salmeterol], Guaifenesin, and Proventil [albuterol]  Social and Family History  Social History reports that she has never smoked. She has never used smokeless tobacco. She reports current alcohol use of about 7.0 standard drinks of alcohol per week. She reports that she does not use drugs.  Family History Family History  Problem Relation Age of Onset  . Leukemia Father  . Coronary Artery Disease (Blocked arteries around heart) Sister  bypass  . Anesthesia problems Sister  awakened during surgery twice  . Stroke Sister  . Coronary Artery Disease (Blocked arteries around heart) Brother  MI- age 43  . COPD Sister  . Stroke Sister  . Diabetes type II Sister  . Asthma Other  . Rheum arthritis Other  . Thyroid disease Other  . Gout Other  . High blood pressure (Hypertension) Other   Review of Systems   Review  of Systems  Positive for dizziness sob Negative for weight gain weight loss, weakness, vision change, hearing loss, cough, congestion, PND, orthopnea, heartburn, nausea, diaphoresis, vomiting, diarrhea, bloody stool, melena, stomach pain, extremity pain, leg weakness, leg cramping, leg blood clots, headache, blackouts, nosebleed, trouble swallowing, mouth pain, urinary frequency, urination at night, muscle  weakness, skin lesions, skin rashes, tingling ,ulcers, numbness, anxiety, and/or depression Physical Examination   Vitals:Pulse 85  Ht 160 cm ('5\' 3"'$ )  Wt 65.3 kg (144 lb)  SpO2 92%  BMI 25.51 kg/m  Ht:160 cm ('5\' 3"'$ ) Wt:65.3 kg (144 lb) IRJ:JOAC surface area is 1.7 meters squared. Body mass index is 25.51 kg/m. Appearance: well appearing in no acute distress HEENT: Pupils equally reactive to light and accomodation, no xanthalasma  Neck: Supple, no apparent thyromegaly, masses, or lymphadenopathy  Lungs: normal respiratory effort; no crackles, no rhonchi, no wheezes Heart: Regular rate and rhythm. Normal S1 S2 No gallops, murmur, no rub, PMI is normal size and placement. carotid upstroke normal without bruit. Jugular venous pressure is normal Abdomen: soft, nontender, not distended with normal bowel sounds. No apparent hepatosplenomegally. Abdominal aorta is normal size without bruit Extremities: no edema, no ulcers, no clubbing, no cyanosis Peripheral Pulses: 2+ in upper extremities, 2+ femoral pulses bilaterally, 2+lower extremity  Musculoskeletal; Normal muscle tone without kyphosis Neurological: Oriented and Alert, Cranial nerves intact  Assessment   86 y.o. female with  Encounter Diagnoses  Name Primary?  . Benign essential hypertension Yes  . Paroxysmal atrial fibrillation (CMS-HCC)  . Sinoatrial node dysfunction   Plan  -the patient will have a consultation and or surgical treatment for recent pacemaker interrogation showing the battery to be end of life. The patient will need a pacemaker battery change out. Patient understands all risks and benefits of battery change out. This includes the possibility of death, stroke, heart attack, infection, bleeding, blood clot, and or side effects of medication management for anesthesia and agrees to proceed -The patient will need a change in treatment management of hypertension for concerns of side effects of current medications. We plan  to Stop b-blocker  -apixaban for anticoagulation and further risk reduction of stroke with atrial fibrillation/flutter. Patient understands all risks and benefits of this medication management including the possibility of side effects including bleeding, bruising, stroke, and interactions with other medication management. The patient understands that we are following current guideline therapy for risk reduction. We will follow closely for any side effects and/or issues listed above. -Proceed to surgery and/or invasive procedure without restriction. The patient is at the lowest risk possible for cardiovascular complication as stated above. Patient may discontinue anticoagulation 3 days prior to surgical intervention and or procedure for reduced bleeding risk. The patient shall restart at a safe period after procedure when bleeding risk is reduced. Patient understands all the risks and benefits of anticoagulation and discontinuation of anticoagulation perioperatively and the quoted study data that applies   No orders of the defined types were placed in this encounter.  Return in about 4 weeks (around 06/04/2019).  Flossie Dibble, MD    Electronically signed by Flossie Dibble, MD at 05/07/2019 3:00 PM EST   Miscellaneous Notes - documented in this encounter Addendum Note - Archie Balboa, CMA - 05/07/2019 2:30 PM EST Addended by: Archie Balboa on: 05/07/2019 03:21 PM  Modules accepted: Orders    Electronically signed by Archie Balboa, [place] at 05/07/2019 3:21 PM EST   Plan of Treatment - documented as of this encounter Upcoming Encounters Upcoming  Encounters  Date Type Specialty Care Team Description  05/30/2019 Office Visit Podiatry Varney Baas, [facility] [place], [place] [address]  [phone]  [phone] ([phone]    06/04/2019 Office Visit Cardiology Flossie Dibble, MD  [address]  [facility]  [place], [place]  [address]  [phone]  [phone] ([phone]    06/12/2019 Office Visit Family Medicine [place], Daylene Posey, MD  [place]  [facility] [place], [place] [address]  [phone]  [phone] ([phone]    07/01/2019 Office Visit Neurology [place] Nakayama, Liliane Shi, MD  [place]  [facility]  [place], [place] [address]  [phone]  [phone] (Fax)    07/10/2019 Procedure visit Cardiology Flossie Dibble, MD  [address]  [facility]  [place], [place] [address]  [phone]  [phone] (Fax)     Goals - documented as of this encounter Goal Patient Goal Type Associated Problems Recent Progress Patient-Stated? Author  Maintain health/healthy lifestyle  Lifestyle  On track (12/10/2018 9:35 AM EDT) Yes Vivi Ferns, CMA  Maintain health/healthy lifestyle  Lifestyle   Yes Quentin Mulling, CMA   Procedures - documented in this encounter Procedure Name Priority Date/Time Associated Diagnosis Comments  CBC W/AUTO DIFFERENTIAL (3 PART DIFF) - [facility] Routine 05/07/2019 3:36 PM EST Pre-op testing  Results for this procedure are in the results section.   PROTHROMBIN TIME (INR) - [facility] AFFIL[facility]ATE, [facility] Routine 05/07/2019 3:36 PM EST Pre-op testing  Results for this procedure are in the results section.   BASIC METABOLIC PANEL (BMP) - [facility] AFFILIATE, [facility] Routine 05/07/2019 3:36 PM EST Pre-op testing  Results for this procedure are in the results section.    Lab Results - documented in this encounter Table of Contents for Lab Results  Prothrombin Time (INR) (05/07/2019 3:36 PM EST)  Basic Metabolic Panel (BMP) (78/67/6720 3:36 PM EST)  CBC w/auto Differential (3 Part) (05/07/2019 3:36 PM EST)     Prothrombin Time (INR) (05/07/2019 3:36 PM EST) Prothrombin Time (INR) (05/07/2019 3:36 PM EST)  Component Value Ref Range Performed At Pathologist Signature   Prothrombin Time 14.8 (H) 10.0 - 13.2 [facility] - LAB   Prothrombin INR 1.3 (L) 2.0 - 3.0 [place] - LAB    Prothrombin Time (INR) (05/07/2019 3:36 PM EST)  Specimen  Blood   Prothrombin Time (INR) (05/07/2019 3:36 PM EST)  Narrative Performed At  Patients on stable oral anticoagulant therapy, the target therapeutic range for INR is 2.0-3.0 in most cases.    Patients with prosthetic heart valves, the range is 2.5-3.5  [facility] - LAB    Prothrombin Time (INR) (05/07/2019 3:36 PM EST)  Performing Organization Address City/State/Zipcode Phone Number  [place]  [place], [place] [address]     Back to top of Lab Results    Basic Metabolic Panel (BMP) (66/29/4765 3:36 PM EST) Basic Metabolic Panel (BMP) (46/50/3546 3:36 PM EST)  Component Value Ref Range Performed At Pathologist Signature  Glucose 99 70 - 110 mg/dL [facility] - [facility]   Sodium 138 136 - 145 mmol/L [facility]   Potassium 4.3 3.6 - 5.1 mmol/L [facility]   Chloride 101 97 - 109 mmol/L [facility]   Carbon Dioxide (CO2) 32.1 (H) 22.0 - 32.0 mmol/L [facility]   Calcium 9.5 8.7 -  10.3 mg/dL [facility]   Urea Nitrogen (BUN) 21 7 - 25 mg/dL [facility]   Creatinine 0.6 0.6 - 1.1 mg/dL [facility]   Glomerular Filtration Rate (eGFR), MDRD Estimate 93 >60 mL/min/1.73sq m [facility]   BUN/Crea Ratio 35.0 (H) 6.0 - 20.0 [facility]   Anion Gap w/K 9.2 6.0 - 16.0 [facility] - [facility]    Basic Metabolic Panel (BMP) (59/93/5701 3:36 PM EST)  Specimen  Blood   Basic Metabolic Panel (BMP) (77/93/9030 3:36 PM EST)  Performing Organization Address City/State/Zipcode Phone Number  [facility] - LAB  [place], [place] [address]     Back to top of Lab  Results    CBC w/auto Differential (3 Part) (05/07/2019 3:36 PM EST) CBC w/auto Differential (3 Part) (05/07/2019 3:36 PM EST)  Component Value Ref Range Performed At Pathologist Signature  WBC (White Blood Cell Count) 11.2 (H) 4.1 - 10.2 10^3/uL [facility] - LAB   RBC (Red Blood Cell Count) 4.94 4.04 - 5.48 10^6/uL [facility] - LAB   Hemoglobin 14.0 12.0 - 15.0 gm/dL [facility] - [facility]   Hematocrit 44.4 35.0 - 47.0 % [facility] - LAB   MCV (Mean Corpuscular Volume) 89.9 80.0 - 100.0 fl [facility] - LAB   MCH (Mean Corpuscular Hemoglobin) 28.3 27.0 - 31.2 pg [facility] - [facility]   MCHC (Mean Corpuscular Hemoglobin Concentration) 31.5 (L) 32.0 - 36.0 gm/dL [facility] - [facility]   Platelet Count 237 150 - 450 10^3/uL [place] - LAB   RDW-CV (Red Cell Distribution Width) 13.4 11.6 - 14.8 % [facility] - LAB   MPV (Mean Platelet Volume) 9.9 9.4 - 12.4 fl [facility]   Neutrophils 7.90 (H) 1.50 - 7.80 10^3/uL [facility] LAB   Lymphocytes 2.30 1.00 - 3.60 10^3/uL [facility] - [facility]   Mixed Count 1.00 (H) 0.10 - 0.90 10^3/uL [facility]   Neutrophil % 70.5 (H) 32.0 - 70.0 % [facility]   Lymphocyte % 20.4 10.0 - 50.0 % [facility]   Mixed % 9.1 3.0 - 14.4 % [facility]    CBC w/auto Differential (3 Part) (05/07/2019 3:36 PM EST)  Specimen  Blood   CBC w/auto Differential (3 Part) (05/07/2019 3:36 PM EST)  Performing Organization Address City/State/Zipcode Phone Number  [place]  [address]  [place], [place] [address]     Back to top of Lab Results  Imaging Results - documented in this encounter  X-ray chest PA and lateral (05/07/2019 3:36 PM EST) X-ray chest PA and lateral (05/07/2019 3:36 PM EST)  Specimen     X-ray chest PA and  lateral (05/07/2019 3:36 PM EST)  Narrative Performed At  This result has an attachment that is not available.        Visit Diagnoses - documented in this encounter Diagnosis  Benign essential hypertension - Primary  Essential hypertension, benign   Paroxysmal atrial fibrillation (CMS-HCC)  Atrial fibrillation   Sinoatrial node dysfunction   Pre-op testing  Unspecified pre-operative examination    Discontinued Medications - documented as of this encounter Medication Sig Discontinue Reason Start Date End Date  metoprolol succinate (TOPROL-XL) 25 MG XL tablet  TAKE 1 TABLET(25 MG) BY MOUTH EVERY DAY  05/17/2018 05/07/2019  Images  Patient Contacts  Contact Name Contact Address Communication Relationship to Patient  Lorraine Lax [place], [place] [address] [phone] (Mobile) Other, Emergency Contact  Document Information  Primary Care Provider Other Service Providers Document Coverage Dates  Mancel Bale, MD (Feb. 20, 2018February 20, 2018 - Present) [phone] (Work) [phone] (Fax) [place] [facility] [place], [place] [address] Internal Medicine [facility] [place], [place] [address] Flossie Dibble, MD (Consulting Provider) [phone] (Work) [phone] (Fax) [facility] [place], [place] [address] Cardiovascular Disease [facility] [place], [place] [address] Erby Pian, MD (Consulting Provider) [phone] (Work) [phone] (Fax) [address] [place], [place] [address] Pulmonary Disease [facility] [place], [place] [address] Marlowe Sax, MD (Consulting Provider) [phone] (Work) [phone] (Fax) [place], [place] [address] Rheumatology [facility] [place], [place] [address] Sharolyn Douglas, MD (Consulting Provider) [phone] (Work) [phone] (Fax) [address] [place], [place] [address] Orthopedic Surgery [facility] [address] [place], [place] [address] Cherrie Gauze, DPM (Consulting Provider) [phone] (Work) [phone] (Fax) [place], [place] [address] [place] [address] [place], [place] [address] [facility] (Ophthalmologist) [phone] (Work) [phone] (Fax) [address] [place], [place] [address] Ophthalmology   [place] Designer, television/film set Provider) [phone] (Work) [phone] (Fax) [place], [place] [address] Orthopedic Surgery   Jan. 12, 2021January 12, 2021   [place] [address] [place], [place] [address]   Encounter Providers Encounter Date  Flossie Dibble, MD (Attending) [phone] (Work) [phone] (Fax) [name] [facility] [place], [place] [address] Cardiovascular Disease Jan. 12, 2021January 12, 2021 - Jan. 21, 2021January 21, 2021    Show All Sections

## 2019-05-29 NOTE — Discharge Instructions (Signed)
Restart Eliquis 05/30/2019.  May remove outer bandage on 05/30/2019.  Leave Steri-Strips on.  May shower 05/30/2019.  AMBULATORY SURGERY  DISCHARGE INSTRUCTIONS   1) The drugs that you were given will stay in your system until tomorrow so for the next 24 hours you should not:  A) Drive an automobile B) Make any legal decisions C) Drink any alcoholic beverage   2) You may resume regular meals tomorrow.  Today it is better to start with liquids and gradually work up to solid foods.  You may eat anything you prefer, but it is better to start with liquids, then soup and crackers, and gradually work up to solid foods.   3) Please notify your doctor immediately if you have any unusual bleeding, trouble breathing, redness and pain at the surgery site, drainage, fever, or pain not relieved by medication.    4) Additional Instructions:        Please contact your physician with any problems or Same Day Surgery at [phone], Monday through Friday 6 am to 4 pm, or [place] at [name] number at [phone].

## 2019-05-29 NOTE — Op Note (Signed)
[facility]   05/29/2019                     1:05 PM  PATIENT:  [name]    PRE-OPERATIVE DIAGNOSIS:  battery ERI  POST-OPERATIVE DIAGNOSIS:  Same  PROCEDURE:  PACEMAKER CHANGE OUT  SURGEON:  Isaias Cowman, MD    ANESTHESIA:     PREOPERATIVE INDICATIONS:  [name] is a  86 y.o. female with a diagnosis of battery ERI who failed conservative measures and elected for surgical management.    The risks benefits and alternatives were discussed with the patient preoperatively including but not limited to the risks of infection, bleeding, cardiopulmonary complications, the need for revision surgery, among others, and the patient was willing to proceed.   OPERATIVE PROCEDURE: The patient was brought to the operating room in a fasting state.  The left pectoral region was prepped and draped in usual sterile manner.  Anesthesia was obtained 1% lidocaine locally.  A 6 cm incision was performed over the left pectoral region.  The existing biventricular pacemaker generator was removed by electrocautery and blunt dissection.  The leads were disconnected and connected to a new rate responsive biventricular pacemaker generator [facility] [place] [id number] ).  The pacemaker pocket was irrigated with gentamicin solution.  The new pacemaker generator was positioned into the pocket and the pocket was closed with 2-0 and 4-0 Vicryl, respectively.  Steri-Strips and a pressure dressing were applied.

## 2019-06-04 ENCOUNTER — Ambulatory Visit: Payer: Medicare Other | Attending: Neurology | Admitting: Physical Therapy

## 2019-06-04 ENCOUNTER — Other Ambulatory Visit: Payer: Self-pay

## 2019-06-04 ENCOUNTER — Encounter: Payer: Self-pay | Admitting: Physical Therapy

## 2019-06-04 DIAGNOSIS — R2689 Other abnormalities of gait and mobility: Secondary | ICD-10-CM | POA: Diagnosis present

## 2019-06-04 DIAGNOSIS — R269 Unspecified abnormalities of gait and mobility: Secondary | ICD-10-CM | POA: Insufficient documentation

## 2019-06-04 DIAGNOSIS — M6281 Muscle weakness (generalized): Secondary | ICD-10-CM | POA: Diagnosis present

## 2019-06-04 NOTE — Therapy (Signed)
[facility] [address] [place], [place], [address] Phone: [phone]   Fax:  [phone]  Physical Therapy Treatment  Patient Details  Name: [name] MRN: [id number] Date of Birth: [birth year] Referring Provider (PT): Dr. Melrose Nakayama   Encounter Date: 06/04/2019  PT End of Session - 06/04/19 1158    Visit Number  11    Number of Visits  19    Date for PT Re-Evaluation  07/02/19    Authorization - Visit Number  1    Authorization - Number of Visits  10    PT Start Time  1028    PT Stop Time  1127    PT Time Calculation (min)  59 min    Equipment Utilized During Treatment  Gait belt    Activity Tolerance  Patient tolerated treatment well    Behavior During Therapy  [facility] for tasks assessed/performed       Past Medical History:  Diagnosis Date  . Arthritis   . Asthma   . Atrial fibrillation ([place])   . Cataract   . Eczema   . GERD (gastroesophageal reflux disease)   . Gout of wrist   . Heart murmur   . Pacemaker   . Pseudogout of ankle or foot   . Thyroid disease   . TIA (transient ischemic attack)     Past Surgical History:  Procedure Laterality Date  . ABLATION    . EYE SURGERY    . foot sugery    . HIP ARTHROPLASTY     right  . IMPLANTABLE CARDIOVERTER DEFIBRILLATOR (ICD) GENERATOR CHANGE Left 05/29/2019   Procedure: PACEMAKER CHANGE OUT;  Surgeon: Isaias Cowman, MD;  Location: [facility] ORS;  Service: Cardiovascular;  Laterality: Left;  . PACEMAKER INSERTION      There were no vitals filed for this visit.  Subjective Assessment - 06/04/19 1455    Subjective  Pt had ICD generator change procedure on 05/29/2019. Pt. reports she is afraid of aggravating her L anterior shoulder pain (she slept on her L shoulder last night and thinks it is squeezed) during today's session and she wants to take easy during today's treatment. Pt. thinks she wants to continue PT to help her improve her balance. No recent falls  reported    Patient is accompained by:  Family member    Limitations  Lifting;Standing;Walking;House hold activities    Patient Stated Goals  Improve balance/ gait/ decrease fall risk.    Currently in Pain?  Yes   no objective number is given   Pain Location  Shoulder    Pain Orientation  Left    Pain Type  Acute pain    Pain Onset  In the past 7 days    Pain Frequency  Intermittent       Therex: Hip flexion/knee extension against manunal resistance 10 reps x 1 set each side.   MMT:  Hip flex.           R (4+/5)   L (4+/5) Hip ABD          R (4+/5)   L (4+/5) Hip ADD          R (5/5)   L (5/5)  Knee ext.        R (5/5)   L (5/5) Knee flex.       R (4+/5)   L (4+/5)  DF  R (4/5)   L (4/5) PF                  Deferred to the next visit IV                   R (4/5)   L (4/5) EV                 R (4/5)   L (4/5)  Reviewed HEP  Neuromuscular re-education: Berg balance test: 33/56  Patient education to use walker to walk in house and bring her walker to the next PT visit.    PT Long Term Goals - 06/04/19 1503      PT LONG TERM GOAL #1   Title  Pt. will increase FOTO to 60 to improve functional mobility.    Baseline  Initial FOTO: 57. Current:: TBA    Time  4    Period  Weeks    Status  On-going    Target Date  07/02/19      PT LONG TERM GOAL #2   Title  Pt. will increase B LE/hip flexion strength 1/2 muscle grade to improve gait/ balance/ safety.    Baseline  B LE muscle strength grossly 5/5 MMT except hip flexion 4/5 MMT (previous R hip surgery). 06/04/2019, hip flex (4+5), (5/5) hip add, knee ext. (4+/5) hip abd, knee flex, DF, IV, EV.    Time  4    Period  Weeks    Status  On-going    Target Date  07/02/19      PT LONG TERM GOAL #3   Title  Pt. will increase Berg balance test to >39 to promote safety/ decrease fall risk.    Baseline  Berg balance test:  39/56 (signifcant fall risk).  1/11: 42/56. 06/04/2019: 33/56    Time  4    Period  Weeks     Status  Not Met    Target Date  07/02/19      PT LONG TERM GOAL #4   Title  Pt. will demonstrate consistent 2-point gait pattern with use of a walker to drecrease fall risks and increase gait independence    Baseline  Pt. ambulates with inconsistent gait pattern with/ without use of SPC.    Time  4    Period  Weeks    Status  Partially Met    Target Date  07/02/19            Plan - 06/04/19 1514    Clinical Impression Statement  Pt. demonstrates increased B hip flexion (4+/5) compared to previous time of (4/5) during MMT. Pt. demonstrates B 4+/5 in hip abd and knee flex. B 4/5 in DF, IV and EV. Pt. demonstrates decreased balance during Berg balance test (33/56) compared to (42/56) and is advised to use walker for ambulation at house and outside for safety purposes. Pt. presents with poor techniques during stand to sit with decreased eccentric control going down but is able to self-correct after verbal cueing and education. Pt. requires at least one hand holding the bar during single leg stance, tandem stance, foot tapping on stool, weightshifting and truning in a circle during berg balance test. Pt. will benefit from skilled PT service to improve static and dynamic balance as well as LE strength to decrease fall risks and increase gait independence.    Personal Factors and Comorbidities  Age;Other    Stability/Clinical Decision Making  Evolving/Moderate complexity    Clinical Decision  Making  Moderate    Rehab Potential  Fair    PT Frequency  2x / week    PT Duration  4 weeks    PT Treatment/Interventions  ADLs/Self Care Home Management;Gait training;Stair training;Functional mobility training;Neuromuscular re-education;Balance training;Therapeutic exercise;Therapeutic activities;Patient/family education;Manual techniques    PT Next Visit Plan  Try balance with head turns. Assess PF strength. weight shifting and one foot step forward. Progress fishing exercise. Assess walking with  walker and do FOTO.    PT Home Exercise Plan  Access Code: [email or web address]    Consulted and Agree with Plan of Care  Patient       Patient will benefit from skilled therapeutic intervention in order to improve the following deficits and impairments:  Abnormal gait, Decreased balance, Decreased endurance, Decreased mobility, Difficulty walking, Dizziness, Decreased activity tolerance, Decreased coordination, Decreased strength, Postural dysfunction  Visit Diagnosis: Gait difficulty  Muscle weakness (generalized)  Balance problems     Problem List Patient Active Problem List   Diagnosis Date Noted  . Leukocytosis 06/29/2016  . Hypogammaglobulinemia ([place]) 06/29/2016  . Temporary cerebral vascular dysfunction 02/28/2014  . DD (diverticular disease) 02/28/2014  . Allergic rhinitis 02/28/2014  . Allergy status to sulfonamides 02/28/2014  . History of anticoagulant therapy 02/28/2014  . Arthritis 02/28/2014  . Airway hyperreactivity 02/28/2014  . A-fib ([place]) 02/28/2014  . Bilateral cataracts 02/28/2014  . Dermatitis, eczematoid 02/28/2014  . Allergy to environmental factors 02/28/2014  . Acid reflux 02/28/2014  . Polypharmacy 02/28/2014  . HLD (hyperlipidemia) 02/28/2014  . Adult hypothyroidism 02/28/2014  . BP (high blood pressure) 02/28/2014  . Arthritis, degenerative 02/28/2014  . Degenerative arthritis of hip 10/09/2013  . 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 Neck rigid 05/30/2013  . 1st degree AV block 10/10/2012  . History of biliary T-tube placement 06/14/2012  . History of cardioversion 06/05/2012  . Avascular necrosis of femoral head ([place]) 06/01/2012  . Lumbar radiculopathy 06/01/2012  . DDD (degenerative disc disease), lumbar 06/01/2012  . H/O cardiac catheterization 10/20/2009   Pura Spice, PT, DPT # [place], SPT 06/05/2019, 2:30 PM  [facility] [address] [place],  [place], [address] Phone: [phone]   Fax:  [phone]  Name: [name] MRN: [id number] Date of Birth: 01-10-1925

## 2019-06-06 ENCOUNTER — Other Ambulatory Visit: Payer: Self-pay

## 2019-06-06 ENCOUNTER — Ambulatory Visit: Payer: Medicare Other | Admitting: Physical Therapy

## 2019-06-06 ENCOUNTER — Encounter: Payer: Self-pay | Admitting: Physical Therapy

## 2019-06-06 DIAGNOSIS — M6281 Muscle weakness (generalized): Secondary | ICD-10-CM

## 2019-06-06 DIAGNOSIS — R269 Unspecified abnormalities of gait and mobility: Secondary | ICD-10-CM | POA: Diagnosis not present

## 2019-06-06 DIAGNOSIS — R2689 Other abnormalities of gait and mobility: Secondary | ICD-10-CM

## 2019-06-06 NOTE — Therapy (Signed)
[facility] [address] [place], [place], [address] Phone: [phone]   Fax:  [phone]  Physical Therapy Treatment  Patient Details  Name: [name] MRN: [id number] Date of Birth: February 14, 1925 Referring Provider (PT): Dr. Melrose Nakayama   Encounter Date: 06/06/2019  PT End of Session - 06/06/19 1251    Visit Number  12    Number of Visits  19    Date for PT Re-Evaluation  07/02/19    Authorization - Visit Number  2    Authorization - Number of Visits  10    PT Start Time  1112    PT Stop Time  1211    PT Time Calculation (min)  59 min    Equipment Utilized During Treatment  Gait belt    Activity Tolerance  Patient tolerated treatment well    Behavior During Therapy  [facility] for tasks assessed/performed       Past Medical History:  Diagnosis Date  . Arthritis   . Asthma   . Atrial fibrillation ([place])   . Cataract   . Eczema   . GERD (gastroesophageal reflux disease)   . Gout of wrist   . Heart murmur   . Pacemaker   . Pseudogout of ankle or foot   . Thyroid disease   . TIA (transient ischemic attack)     Past Surgical History:  Procedure Laterality Date  . ABLATION    . EYE SURGERY    . foot sugery    . HIP ARTHROPLASTY     right  . IMPLANTABLE CARDIOVERTER DEFIBRILLATOR (ICD) GENERATOR CHANGE Left 05/29/2019   Procedure: PACEMAKER CHANGE OUT;  Surgeon: Isaias Cowman, MD;  Location: [facility] ORS;  Service: Cardiovascular;  Laterality: Left;  . PACEMAKER INSERTION      There were no vitals filed for this visit.  Subjective Assessment - 06/06/19 1249    Subjective  Pt. reports increased fatigue and decrease strength in her legs due to not moving enough since the ICD generator change procedure on 05/29/2019. Pt. reports dizziness this morning and prior to today's session. Pt. states she feels safer and more stable when walking with a walker at her house.    Patient is accompained by:  Family member     Limitations  Lifting;Standing;Walking;House hold activities    Patient Stated Goals  Improve balance/ gait/ decrease fall risk.    Currently in Pain?  No/denies    Pain Onset  In the past 7 days       Therex: Nustep L 2 10 min Sit to stand 5 reps x 2 sets at blue mat table, verbal cueing to decent in a control manner from standing position with CGA Standing hip flexion against manual resistance and isometric hold for 3 s 10 reps x 1 set each leg.  Neuromuscular re-education: Gait training with walker in hallway 3 laps with head turns Step forward and maintain balance at the position for 5 s, 5 reps x 2 sets each leg Dynamic Gait index: 9/24, dynamic gait assessment in hallway with CGA.   PT Long Term Goals - 06/04/19 1503      PT LONG TERM GOAL #1   Title  Pt. will increase FOTO to 60 to improve functional mobility.    Baseline  Initial FOTO: 57. Current:: TBA    Time  4    Period  Weeks    Status  On-going    Target Date  07/02/19  PT LONG TERM GOAL #2   Title  Pt. will increase B LE/hip flexion strength 1/2 muscle grade to improve gait/ balance/ safety.    Baseline  B LE muscle strength grossly 5/5 MMT except hip flexion 4/5 MMT (previous R hip surgery). 06/04/2019, hip flex (4+5), (5/5) hip add, knee ext. (4+/5) hip abd, knee flex, DF, IV, EV.    Time  4    Period  Weeks    Status  On-going    Target Date  07/02/19      PT LONG TERM GOAL #3   Title  Pt. will increase Berg balance test to >39 to promote safety/ decrease fall risk.    Baseline  Berg balance test:  39/56 (signifcant fall risk).  1/11: 42/56. 06/04/2019: 33/56    Time  4    Period  Weeks    Status  Not Met    Target Date  07/02/19      PT LONG TERM GOAL #4   Title  Pt. will demonstrate consistent 2-point gait pattern with use of a walker to drecrease fall risks and increase gait independence    Baseline  Pt. ambulates with inconsistent gait pattern with/ without use of SPC.    Time  4    Period  Weeks     Status  Partially Met    Target Date  07/02/19            Plan - 06/06/19 1303    Clinical Impression Statement  Pt. presents with a more normalized gait when ambulating with a walker as compared to ambulating with a cane during gait training in hallway. Pt. presents with less trunk lean towards one side and less trunk lean towards back side compared to the previous session while walking with a cane. Pt. completed DGI without an assistive device and demonstrates poor dynamic gait/balance during dynamic gait assessment (DGI:9/24) and requires CGA and UE assist to prevent falling. Pt. requires CGA and UE assist from SPT during step forward weight shifting training during standing. Pt. performs step forward with feet dragging but is able to lift up her feet when stepping forward and coming back with CGA. Pt. completes sit to stand and seated hip flexion against manual resistance with no increase complaints/difficulty.    Personal Factors and Comorbidities  Age;Other    Examination-Activity Limitations  Stairs;Squat    Stability/Clinical Decision Making  Evolving/Moderate complexity    Clinical Decision Making  Moderate    Rehab Potential  Fair    PT Frequency  2x / week    PT Duration  4 weeks    PT Treatment/Interventions  ADLs/Self Care Home Management;Gait training;Stair training;Functional mobility training;Neuromuscular re-education;Balance training;Therapeutic exercise;Therapeutic activities;Patient/family education;Manual techniques    PT Next Visit Plan  Try balance with head turns. Assess PF strength. weight shifting and one foot step forward. Progress fishing exercise. Do FOTO.    PT Home Exercise Plan  Access Code: [email or web address]    Consulted and Agree with Plan of Care  Patient       Patient will benefit from skilled therapeutic intervention in order to improve the following deficits and impairments:  Abnormal gait, Decreased balance, Decreased endurance, Decreased mobility,  Difficulty walking, Dizziness, Decreased activity tolerance, Decreased coordination, Decreased strength, Postural dysfunction  Visit Diagnosis: Gait difficulty  Muscle weakness (generalized)  Balance problems     Problem List Patient Active Problem List   Diagnosis Date Noted  . Leukocytosis 06/29/2016  . Hypogammaglobulinemia ([place]) 06/29/2016  . Temporary  cerebral vascular dysfunction 02/28/2014  . DD (diverticular disease) 02/28/2014  . Allergic rhinitis 02/28/2014  . Allergy status to sulfonamides 02/28/2014  . History of anticoagulant therapy 02/28/2014  . Arthritis 02/28/2014  . Airway hyperreactivity 02/28/2014  . A-fib ([place]) 02/28/2014  . Bilateral cataracts 02/28/2014  . Dermatitis, eczematoid 02/28/2014  . Allergy to environmental factors 02/28/2014  . Acid reflux 02/28/2014  . Polypharmacy 02/28/2014  . HLD (hyperlipidemia) 02/28/2014  . Adult hypothyroidism 02/28/2014  . BP (high blood pressure) 02/28/2014  . Arthritis, degenerative 02/28/2014  . Degenerative arthritis of hip 10/09/2013  . 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 Neck rigid 05/30/2013  . 1st degree AV block 10/10/2012  . History of biliary T-tube placement 06/14/2012  . History of cardioversion 06/05/2012  . Avascular necrosis of femoral head ([place]) 06/01/2012  . Lumbar radiculopathy 06/01/2012  . DDD (degenerative disc disease), lumbar 06/01/2012  . H/O cardiac catheterization 10/20/2009   Pura Spice, PT, DPT # [place], SPT 06/07/2019, 10:21 AM  [facility] [address] [place], [place], [address] Phone: [phone]   Fax:  [phone]  Name: Aubriegh Minch MRN: [id number] Date of Birth: 08-02-1924

## 2019-06-11 ENCOUNTER — Ambulatory Visit: Payer: Medicare Other | Admitting: Physical Therapy

## 2019-06-11 ENCOUNTER — Other Ambulatory Visit: Payer: Self-pay

## 2019-06-11 DIAGNOSIS — R269 Unspecified abnormalities of gait and mobility: Secondary | ICD-10-CM | POA: Diagnosis not present

## 2019-06-11 DIAGNOSIS — R2689 Other abnormalities of gait and mobility: Secondary | ICD-10-CM

## 2019-06-11 DIAGNOSIS — M6281 Muscle weakness (generalized): Secondary | ICD-10-CM

## 2019-06-11 NOTE — Therapy (Signed)
[place] [facility] [address] [place], [place], [address] Phone: [phone]   Fax:  [phone]  Physical Therapy Treatment  Patient Details  Name: [name] MRN: [id number] Date of Birth: 1924-06-28 Referring Provider (PT): Dr. Melrose Nakayama   Encounter Date: 06/11/2019  PT End of Session - 06/11/19 1130    Visit Number  13    Number of Visits  19    Date for PT Re-Evaluation  07/02/19    Authorization - Visit Number  3    Authorization - Number of Visits  10    PT Start Time  1020    PT Stop Time  1121    PT Time Calculation (min)  61 min    Equipment Utilized During Treatment  Gait belt    Activity Tolerance  Patient tolerated treatment well    Behavior During Therapy  [facility] for tasks assessed/performed       Past Medical History:  Diagnosis Date  . Arthritis   . Asthma   . Atrial fibrillation ([place])   . Cataract   . Eczema   . GERD (gastroesophageal reflux disease)   . Gout of wrist   . Heart murmur   . Pacemaker   . Pseudogout of ankle or foot   . Thyroid disease   . TIA (transient ischemic attack)     Past Surgical History:  Procedure Laterality Date  . ABLATION    . EYE SURGERY    . foot sugery    . HIP ARTHROPLASTY     right  . IMPLANTABLE CARDIOVERTER DEFIBRILLATOR (ICD) GENERATOR CHANGE Left 05/29/2019   Procedure: PACEMAKER CHANGE OUT;  Surgeon: Isaias Cowman, MD;  Location: [facility] ORS;  Service: Cardiovascular;  Laterality: Left;  . PACEMAKER INSERTION      There were no vitals filed for this visit.  Subjective Assessment - 06/11/19 1027    Subjective  Pt. reports a little tired prior to today's session. Pt states she almost fell backward when walking in her house since last PT session but is able to hold on wall to maintain her balance. Pt. states she does not use walker at house because it is not convienent for her to move the walker around. No recent falls reported    Patient is accompained  by:  Family member    Limitations  Lifting;Standing;Walking;House hold activities    Patient Stated Goals  Improve balance/ gait/ decrease fall risk.    Currently in Pain?  No/denies    Pain Onset  In the past 7 days        Therex: Nustep L 4 10 min Sit to stand at blue mat table (20.5 to 19 inches) 10 reps x 2 sets, verbal cueing to increase trunk lean to stand up and decent in a controlled manner Seated RTB hip flexion/hip abduction and isometric holds for 3 s 10 reps x 1 set each sdie   Neuromuscular re-education: Step forward and step side way (weightshifting) 3 reps x 2 sets on each side Tandem stance 20 s each side 2 reps each side Tandem walk 3 laps within //-bars, CGA, one finger hold on bars for UE assist to maintain balance Gait training with head turns 5 laps in hallway with SBA and CGA, verbal cueing to increase B heel strikes      PT Long Term Goals - 06/04/19 1503      PT LONG TERM GOAL #1   Title  Pt. will increase FOTO to  60 to improve functional mobility.    Baseline  Initial FOTO: 57. Current:: TBA    Time  4    Period  Weeks    Status  On-going    Target Date  07/02/19      PT LONG TERM GOAL #2   Title  Pt. will increase B LE/hip flexion strength 1/2 muscle grade to improve gait/ balance/ safety.    Baseline  B LE muscle strength grossly 5/5 MMT except hip flexion 4/5 MMT (previous R hip surgery). 06/04/2019, hip flex (4+5), (5/5) hip add, knee ext. (4+/5) hip abd, knee flex, DF, IV, EV.    Time  4    Period  Weeks    Status  On-going    Target Date  07/02/19      PT LONG TERM GOAL #3   Title  Pt. will increase Berg balance test to >39 to promote safety/ decrease fall risk.    Baseline  Berg balance test:  39/56 (signifcant fall risk).  1/11: 42/56. 06/04/2019: 33/56    Time  4    Period  Weeks    Status  Not Met    Target Date  07/02/19      PT LONG TERM GOAL #4   Title  Pt. will demonstrate consistent 2-point gait pattern with use of a walker to  drecrease fall risks and increase gait independence    Baseline  Pt. ambulates with inconsistent gait pattern with/ without use of SPC.    Time  4    Period  Weeks    Status  Partially Met    Target Date  07/02/19            Plan - 06/11/19 1144    Clinical Impression Statement  Pt. comes to clinic today with a cane. Pt. requires multiple attempts to perform sit to stand at blue mat table at height of 19 inches but is able to perform it with proper techniques after verbal cueing to increase forward trunk lean when standing up. Pt. requires verbal cueing to decent in controlled manner. Pt. requires CGA with step forward and side way (weightshifting) to maintain balance, pt is able to lift leg up when making steps with minimal verbal cueing. Pt. requires moderate hand touching at bars to maintain balance during tandem stance within //-bars. Pt. requires one finger for UE assist to maintain balance during tandem walk within //-bars. Pts gait speed reduces when adding horizontal head turns during forward ambulation without LOB with CGA.    Personal Factors and Comorbidities  Age;Other    Examination-Activity Limitations  Stairs;Squat    Stability/Clinical Decision Making  Evolving/Moderate complexity    Clinical Decision Making  Moderate    Rehab Potential  Fair    PT Frequency  2x / week    PT Duration  4 weeks    PT Treatment/Interventions  ADLs/Self Care Home Management;Gait training;Stair training;Functional mobility training;Neuromuscular re-education;Balance training;Therapeutic exercise;Therapeutic activities;Patient/family education;Manual techniques    PT Next Visit Plan  Functional standing activities. Progress fishing exercise.    PT Home Exercise Plan  Access Code: [email or web address]    Consulted and Agree with Plan of Care  Patient       Patient will benefit from skilled therapeutic intervention in order to improve the following deficits and impairments:  Abnormal gait, Decreased  balance, Decreased endurance, Decreased mobility, Difficulty walking, Dizziness, Decreased activity tolerance, Decreased coordination, Decreased strength, Postural dysfunction  Visit Diagnosis: Gait difficulty  Muscle weakness (generalized)  Balance  problems     Problem List Patient Active Problem List   Diagnosis Date Noted  . Leukocytosis 06/29/2016  . Hypogammaglobulinemia ([place]) 06/29/2016  . Temporary cerebral vascular dysfunction 02/28/2014  . DD (diverticular disease) 02/28/2014  . Allergic rhinitis 02/28/2014  . Allergy status to sulfonamides 02/28/2014  . History of anticoagulant therapy 02/28/2014  . Arthritis 02/28/2014  . Airway hyperreactivity 02/28/2014  . A-fib ([place]) 02/28/2014  . Bilateral cataracts 02/28/2014  . Dermatitis, eczematoid 02/28/2014  . Allergy to environmental factors 02/28/2014  . Acid reflux 02/28/2014  . Polypharmacy 02/28/2014  . HLD (hyperlipidemia) 02/28/2014  . Adult hypothyroidism 02/28/2014  . BP (high blood pressure) 02/28/2014  . Arthritis, degenerative 02/28/2014  . Degenerative arthritis of hip 10/09/2013  . 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 Neck rigid 05/30/2013  . 1st degree AV block 10/10/2012  . History of biliary T-tube placement 06/14/2012  . History of cardioversion 06/05/2012  . Avascular necrosis of femoral head ([place]) 06/01/2012  . Lumbar radiculopathy 06/01/2012  . DDD (degenerative disc disease), lumbar 06/01/2012  . H/O cardiac catheterization 10/20/2009   Pura Spice, PT, DPT # [place], SPT 06/11/2019, 12:08 PM  [facility] [address] [place], [place], [address] Phone: [phone]   Fax:  [phone]  Name: Corsica Franson MRN: [id number] Date of Birth: [birth year]

## 2019-06-14 ENCOUNTER — Ambulatory Visit: Payer: Medicare Other | Admitting: Physical Therapy

## 2019-06-14 ENCOUNTER — Other Ambulatory Visit: Payer: Self-pay

## 2019-06-14 ENCOUNTER — Encounter: Payer: Self-pay | Admitting: Physical Therapy

## 2019-06-14 DIAGNOSIS — R2689 Other abnormalities of gait and mobility: Secondary | ICD-10-CM

## 2019-06-14 DIAGNOSIS — M6281 Muscle weakness (generalized): Secondary | ICD-10-CM

## 2019-06-14 DIAGNOSIS — R269 Unspecified abnormalities of gait and mobility: Secondary | ICD-10-CM

## 2019-06-14 NOTE — Therapy (Signed)
[place] [facility] [address] [place], [place], [address] Phone: [phone]   Fax:  [phone]  Physical Therapy Treatment  Patient Details  Name: [name] MRN: [id number] Date of Birth: 1925/03/17 Referring Provider (PT): Dr. Melrose Nakayama   Encounter Date: 06/14/2019  PT End of Session - 06/14/19 1243    Visit Number  14    Number of Visits  19    Date for PT Re-Evaluation  07/02/19    Authorization - Visit Number  4    Authorization - Number of Visits  10    PT Start Time  0762    PT Stop Time  1108    PT Time Calculation (min)  53 min    Equipment Utilized During Treatment  Gait belt    Activity Tolerance  Patient tolerated treatment well    Behavior During Therapy  [facility] for tasks assessed/performed       Past Medical History:  Diagnosis Date  . Arthritis   . Asthma   . Atrial fibrillation ([place])   . Cataract   . Eczema   . GERD (gastroesophageal reflux disease)   . Gout of wrist   . Heart murmur   . Pacemaker   . Pseudogout of ankle or foot   . Thyroid disease   . TIA (transient ischemic attack)     Past Surgical History:  Procedure Laterality Date  . ABLATION    . EYE SURGERY    . foot sugery    . HIP ARTHROPLASTY     right  . IMPLANTABLE CARDIOVERTER DEFIBRILLATOR (ICD) GENERATOR CHANGE Left 05/29/2019   Procedure: PACEMAKER CHANGE OUT;  Surgeon: Isaias Cowman, MD;  Location: [facility] ORS;  Service: Cardiovascular;  Laterality: Left;  . PACEMAKER INSERTION      There were no vitals filed for this visit.  Subjective Assessment - 06/14/19 1241    Subjective  Pt. reports being tired prior to today's session. Pt states she almost fell backward again on 06/12/2019 when walking in her bathroom but is able to hold on wall to maintain her balance. No recent falls reported.    Patient is accompained by:  Family member    Limitations  Lifting;Standing;Walking;House hold activities    Patient Stated Goals   Improve balance/ gait/ decrease fall risk.    Currently in Pain?  No/denies    Pain Onset  In the past 7 days          Therex: Nustep L 4 10 min B UE/LE (warm-up)- discussed daily tasks at home/ walking.   Sit to stand at gray chair (outside of //-bars)- 10 reps x 2 sets, verbal cueing to increase trunk lean to stand up and decent in a controlled manner Standing hip ex. Program (flexion/ abduction/ extension)- 20x each.  Mirror feedback for cuing/ correction.    Neuromuscular re-education: Step forward and step side way (weightshifting) 5 reps x 2 sets on each side Tandem stance 20 s each side 2 reps each side Tandem walk 3 laps within //-bars, CGA, one finger hold on bars for UE assist to maintain balance Walking at agility ladder with added cone taps with use of SPC for support/ CGA for safety.  Pt. Requires extra time for safety.      PT Long Term Goals - 06/04/19 1503      PT LONG TERM GOAL #1   Title  Pt. will increase FOTO to 60 to improve functional mobility.    Baseline  Initial FOTO: 57. Current:: TBA    Time  4    Period  Weeks    Status  On-going    Target Date  07/02/19      PT LONG TERM GOAL #2   Title  Pt. will increase B LE/hip flexion strength 1/2 muscle grade to improve gait/ balance/ safety.    Baseline  B LE muscle strength grossly 5/5 MMT except hip flexion 4/5 MMT (previous R hip surgery). 06/04/2019, hip flex (4+5), (5/5) hip add, knee ext. (4+/5) hip abd, knee flex, DF, IV, EV.    Time  4    Period  Weeks    Status  On-going    Target Date  07/02/19      PT LONG TERM GOAL #3   Title  Pt. will increase Berg balance test to >39 to promote safety/ decrease fall risk.    Baseline  Berg balance test:  39/56 (signifcant fall risk).  1/11: 42/56. 06/04/2019: 33/56    Time  4    Period  Weeks    Status  Not Met    Target Date  07/02/19      PT LONG TERM GOAL #4   Title  Pt. will demonstrate consistent 2-point gait pattern with use of a walker to drecrease  fall risks and increase gait independence    Baseline  Pt. ambulates with inconsistent gait pattern with/ without use of SPC.    Time  4    Period  Weeks    Status  Partially Met    Target Date  07/02/19        Pt. worked hard during PT tx. session with focus on balance and improving ability to self-corret any LOB.  Pt. able to wt. shifting/ forward lunge with light to no UE assist.  Pt. benefits from SBA/CGA t/o tx. session for safety/ verbal cuing.  Extra time with cone walking/ taps.  Pt. had difficulty at end to tx. session with sti to stands from green/gray chair secondary to fatigue in B LE.       Patient will benefit from skilled therapeutic intervention in order to improve the following deficits and impairments:     Visit Diagnosis: Gait difficulty  Muscle weakness (generalized)  Balance problems     Problem List Patient Active Problem List   Diagnosis Date Noted  . Leukocytosis 06/29/2016  . Hypogammaglobulinemia ([place]) 06/29/2016  . Temporary cerebral vascular dysfunction 02/28/2014  . DD (diverticular disease) 02/28/2014  . Allergic rhinitis 02/28/2014  . Allergy status to sulfonamides 02/28/2014  . History of anticoagulant therapy 02/28/2014  . Arthritis 02/28/2014  . Airway hyperreactivity 02/28/2014  . A-fib ([place]) 02/28/2014  . Bilateral cataracts 02/28/2014  . Dermatitis, eczematoid 02/28/2014  . Allergy to environmental factors 02/28/2014  . Acid reflux 02/28/2014  . Polypharmacy 02/28/2014  . HLD (hyperlipidemia) 02/28/2014  . Adult hypothyroidism 02/28/2014  . BP (high blood pressure) 02/28/2014  . Arthritis, degenerative 02/28/2014  . Degenerative arthritis of hip 10/09/2013  . 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 Neck rigid 05/30/2013  . 1st degree AV block 10/10/2012  . History of biliary T-tube placement 06/14/2012  . History of cardioversion 06/05/2012  . Avascular necrosis of femoral head ([place]) 06/01/2012  .  Lumbar radiculopathy 06/01/2012  . DDD (degenerative disc disease), lumbar 06/01/2012  . H/O cardiac catheterization 10/20/2009   Pura Spice, PT, DPT # [phone] 06/14/2019, 3:42 PM  [facility]  [address] [place], [place], [address] Phone: [phone]   Fax:  [phone]  Name: [name] MRN: [id number] Date of Birth: 11-Jun-1924

## 2019-06-17 ENCOUNTER — Encounter: Payer: Medicare Other | Admitting: Physical Therapy

## 2019-06-18 ENCOUNTER — Other Ambulatory Visit: Payer: Self-pay | Admitting: Internal Medicine

## 2019-06-18 DIAGNOSIS — S43211D Anterior subluxation of right sternoclavicular joint, subsequent encounter: Secondary | ICD-10-CM

## 2019-06-18 DIAGNOSIS — K224 Dyskinesia of esophagus: Secondary | ICD-10-CM

## 2019-06-18 DIAGNOSIS — K219 Gastro-esophageal reflux disease without esophagitis: Secondary | ICD-10-CM

## 2019-06-18 DIAGNOSIS — R1310 Dysphagia, unspecified: Secondary | ICD-10-CM

## 2019-06-18 DIAGNOSIS — R1314 Dysphagia, pharyngoesophageal phase: Secondary | ICD-10-CM

## 2019-06-20 ENCOUNTER — Encounter: Payer: Medicare Other | Admitting: Physical Therapy

## 2019-06-24 ENCOUNTER — Ambulatory Visit: Payer: Medicare Other

## 2019-06-25 ENCOUNTER — Other Ambulatory Visit: Payer: Self-pay

## 2019-06-25 ENCOUNTER — Ambulatory Visit: Payer: Medicare Other | Attending: Neurology | Admitting: Physical Therapy

## 2019-06-25 ENCOUNTER — Encounter: Payer: Self-pay | Admitting: Physical Therapy

## 2019-06-25 DIAGNOSIS — M6281 Muscle weakness (generalized): Secondary | ICD-10-CM | POA: Insufficient documentation

## 2019-06-25 DIAGNOSIS — R269 Unspecified abnormalities of gait and mobility: Secondary | ICD-10-CM

## 2019-06-25 DIAGNOSIS — R2689 Other abnormalities of gait and mobility: Secondary | ICD-10-CM | POA: Diagnosis present

## 2019-06-25 NOTE — Therapy (Signed)
[place] [facility] [address] [place], [place], [address] Phone: [phone]   Fax:  [phone]  Physical Therapy Treatment  Patient Details  Name: [name] MRN: [id number] Date of Birth: 10/15/1924 Referring Provider (PT): Dr. Melrose Nakayama   Encounter Date: 06/25/2019    Treatment: 15 of 19.  Recert date: 12/01/8755 9728 to 0950   Past Medical History:  Diagnosis Date  . Arthritis   . Asthma   . Atrial fibrillation ([place])   . Cataract   . Eczema   . GERD (gastroesophageal reflux disease)   . Gout of wrist   . Heart murmur   . Pacemaker   . Pseudogout of ankle or foot   . Thyroid disease   . TIA (transient ischemic attack)     Past Surgical History:  Procedure Laterality Date  . ABLATION    . EYE SURGERY    . foot sugery    . HIP ARTHROPLASTY     right  . IMPLANTABLE CARDIOVERTER DEFIBRILLATOR (ICD) GENERATOR CHANGE Left 05/29/2019   Procedure: PACEMAKER CHANGE OUT;  Surgeon: Isaias Cowman, MD;  Location: [facility] ORS;  Service: Cardiovascular;  Laterality: Left;  . PACEMAKER INSERTION      There were no vitals filed for this visit.    Pt. states she almost fell into refrigerator this morning. Pt. entered PT with use of SPC and no new complaints. No c/o pain.        Therex: Nustep L 4 10 min B UE/LE (warm-up)- discussed daily tasks at home/ walking.   Standing heel raises/ marching/ hip abduction with mirror feedback.  Sit to standat gray chair (outside of //-bars)- 10reps x 2sets, verbal cueing to increase trunk lean to stand up and decent in a controlled manner    Neuromuscular re-education: Walking around PT clinic and in //-bars with step over (green hurdles) and Airex Airex: functional reaching with cone reaching (varying levels) Step forward and step side way (weightshifting) 5 reps x 2 setson each side Tandem stance 20 s each side 2 reps each side Seated/ standing posture correction with  mirror feedback.  Reinforcing proper technique with sit to stands to prevent posterior lean/ LOB.      PT Long Term Goals - 06/04/19 1503      PT LONG TERM GOAL #1   Title  Pt. will increase FOTO to 60 to improve functional mobility.    Baseline  Initial FOTO: 57. Current:: TBA    Time  4    Period  Weeks    Status  On-going    Target Date  07/02/19      PT LONG TERM GOAL #2   Title  Pt. will increase B LE/hip flexion strength 1/2 muscle grade to improve gait/ balance/ safety.    Baseline  B LE muscle strength grossly 5/5 MMT except hip flexion 4/5 MMT (previous R hip surgery). 06/04/2019, hip flex (4+5), (5/5) hip add, knee ext. (4+/5) hip abd, knee flex, DF, IV, EV.    Time  4    Period  Weeks    Status  On-going    Target Date  07/02/19      PT LONG TERM GOAL #3   Title  Pt. will increase Berg balance test to >39 to promote safety/ decrease fall risk.    Baseline  Berg balance test:  39/56 (signifcant fall risk).  1/11: 42/56. 06/04/2019: 33/56    Time  4    Period  Weeks  Status  Not Met    Target Date  07/02/19      PT LONG TERM GOAL #4   Title  Pt. will demonstrate consistent 2-point gait pattern with use of a walker to drecrease fall risks and increase gait independence    Baseline  Pt. ambulates with inconsistent gait pattern with/ without use of SPC.    Time  4    Period  Weeks    Status  Partially Met    Target Date  07/02/19         Pt. returns to Dr. Melrose Nakayama on Monday 3/8 for f/u. Pt. remains limited by fatigue with prolonged standing/ walking around PT clinic. No c/o pain but increase posterior lean during sit to stands, esp. when fatigue and not focused on proper technique/ position. Pt. able to demonstrate proper sit to stands from 17" chair with minimal to no UE assist but inconsistent technique. PT has instructed pt. on proper seat position and importance of head/nose over toes to prevent posterior LOB      Patient will benefit from skilled therapeutic  intervention in order to improve the following deficits and impairments:  Abnormal gait, Decreased balance, Decreased endurance, Decreased mobility, Difficulty walking, Dizziness, Decreased activity tolerance, Decreased coordination, Decreased strength, Postural dysfunction  Visit Diagnosis: Gait difficulty  Muscle weakness (generalized)  Balance problems     Problem List Patient Active Problem List   Diagnosis Date Noted  . Leukocytosis 06/29/2016  . Hypogammaglobulinemia ([place]) 06/29/2016  . Temporary cerebral vascular dysfunction 02/28/2014  . DD (diverticular disease) 02/28/2014  . Allergic rhinitis 02/28/2014  . Allergy status to sulfonamides 02/28/2014  . History of anticoagulant therapy 02/28/2014  . Arthritis 02/28/2014  . Airway hyperreactivity 02/28/2014  . A-fib ([place]) 02/28/2014  . Bilateral cataracts 02/28/2014  . Dermatitis, eczematoid 02/28/2014  . Allergy to environmental factors 02/28/2014  . Acid reflux 02/28/2014  . Polypharmacy 02/28/2014  . HLD (hyperlipidemia) 02/28/2014  . Adult hypothyroidism 02/28/2014  . BP (high blood pressure) 02/28/2014  . Arthritis, degenerative 02/28/2014  . Degenerative arthritis of hip 10/09/2013  . 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 Neck rigid 05/30/2013  . 1st degree AV block 10/10/2012  . History of biliary T-tube placement 06/14/2012  . History of cardioversion 06/05/2012  . Avascular necrosis of femoral head ([place]) 06/01/2012  . Lumbar radiculopathy 06/01/2012  . DDD (degenerative disc disease), lumbar 06/01/2012  . H/O cardiac catheterization 10/20/2009   Pura Spice, PT, DPT # [phone] 06/26/2019, 7:33 PM  [place] [facility] [address] [place], [place], [address] Phone: [phone]   Fax:  [phone]  Name: [name] MRN: [id number] Date of Birth: [birth year]

## 2019-06-26 ENCOUNTER — Ambulatory Visit: Payer: Medicare Other

## 2019-06-27 ENCOUNTER — Encounter: Payer: Self-pay | Admitting: Physical Therapy

## 2019-06-27 ENCOUNTER — Other Ambulatory Visit: Payer: Self-pay

## 2019-06-27 ENCOUNTER — Ambulatory Visit: Payer: Medicare Other | Admitting: Physical Therapy

## 2019-06-27 DIAGNOSIS — R269 Unspecified abnormalities of gait and mobility: Secondary | ICD-10-CM | POA: Diagnosis not present

## 2019-06-27 DIAGNOSIS — M6281 Muscle weakness (generalized): Secondary | ICD-10-CM

## 2019-06-27 DIAGNOSIS — R2689 Other abnormalities of gait and mobility: Secondary | ICD-10-CM
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
## 2019-07-02 ENCOUNTER — Ambulatory Visit: Payer: Medicare Other

## 2019-07-06 DIAGNOSIS — H9313 Tinnitus, bilateral: Secondary | ICD-10-CM | POA: Insufficient documentation

## 2019-07-06 DIAGNOSIS — M545 Low back pain, unspecified: Secondary | ICD-10-CM | POA: Insufficient documentation

## 2019-07-06 DIAGNOSIS — G8929 Other chronic pain: Secondary | ICD-10-CM | POA: Insufficient documentation

## 2019-07-25 ENCOUNTER — Other Ambulatory Visit: Payer: Self-pay

## 2019-07-25 ENCOUNTER — Inpatient Hospital Stay
Admission: EM | Admit: 2019-07-25 | Discharge: 2019-07-28 | DRG: 373 | Disposition: A | Discharge status: Home / Self Care | Payer: Medicare Other | Source: Ambulatory Visit | Attending: General Surgery | Admitting: General Surgery

## 2019-07-25 ENCOUNTER — Ambulatory Visit (INDEPENDENT_AMBULATORY_CARE_PROVIDER_SITE_OTHER)
Admission: EM | Admit: 2019-07-25 | Discharge: 2019-07-25 | Disposition: A | Discharge status: Other Acute Inpatient Hospital | Payer: Medicare Other | Source: Home / Self Care

## 2019-07-25 ENCOUNTER — Ambulatory Visit
Admit: 2019-07-25 | Discharge: 2019-07-25 | Disposition: A | Discharge status: Home / Self Care | Payer: Medicare Other | Attending: Urgent Care | Admitting: Urgent Care

## 2019-07-25 DIAGNOSIS — Z7901 Long term (current) use of anticoagulants: Secondary | ICD-10-CM | POA: Diagnosis not present

## 2019-07-25 DIAGNOSIS — Z20822 Contact with and (suspected) exposure to covid-19: Secondary | ICD-10-CM | POA: Diagnosis present

## 2019-07-25 DIAGNOSIS — Z888 Allergy status to other drugs, medicaments and biological substances status: Secondary | ICD-10-CM

## 2019-07-25 DIAGNOSIS — K3580 Unspecified acute appendicitis: Secondary | ICD-10-CM | POA: Diagnosis present

## 2019-07-25 DIAGNOSIS — Z95810 Presence of automatic (implantable) cardiac defibrillator: Secondary | ICD-10-CM

## 2019-07-25 DIAGNOSIS — Z881 Allergy status to other antibiotic agents status: Secondary | ICD-10-CM | POA: Diagnosis not present

## 2019-07-25 DIAGNOSIS — Z8673 Personal history of transient ischemic attack (TIA), and cerebral infarction without residual deficits: Secondary | ICD-10-CM | POA: Diagnosis not present

## 2019-07-25 DIAGNOSIS — Z7951 Long term (current) use of inhaled steroids: Secondary | ICD-10-CM | POA: Diagnosis not present

## 2019-07-25 DIAGNOSIS — K219 Gastro-esophageal reflux disease without esophagitis: Secondary | ICD-10-CM | POA: Diagnosis present

## 2019-07-25 DIAGNOSIS — M1990 Unspecified osteoarthritis, unspecified site: Secondary | ICD-10-CM | POA: Diagnosis present

## 2019-07-25 DIAGNOSIS — Z79899 Other long term (current) drug therapy: Secondary | ICD-10-CM

## 2019-07-25 DIAGNOSIS — Z882 Allergy status to sulfonamides status: Secondary | ICD-10-CM

## 2019-07-25 DIAGNOSIS — J45909 Unspecified asthma, uncomplicated: Secondary | ICD-10-CM | POA: Diagnosis present

## 2019-07-25 DIAGNOSIS — K3533 Acute appendicitis with perforation and localized peritonitis, with abscess: Principal | ICD-10-CM | POA: Diagnosis present

## 2019-07-25 DIAGNOSIS — E785 Hyperlipidemia, unspecified: Secondary | ICD-10-CM | POA: Diagnosis present

## 2019-07-25 DIAGNOSIS — Z791 Long term (current) use of non-steroidal anti-inflammatories (NSAID): Secondary | ICD-10-CM | POA: Diagnosis not present

## 2019-07-25 DIAGNOSIS — E079 Disorder of thyroid, unspecified: Secondary | ICD-10-CM | POA: Diagnosis present

## 2019-07-25 DIAGNOSIS — Z885 Allergy status to narcotic agent status: Secondary | ICD-10-CM

## 2019-07-25 DIAGNOSIS — I4891 Unspecified atrial fibrillation: Secondary | ICD-10-CM | POA: Diagnosis present

## 2019-07-25 LAB — URINALYSIS, COMPLETE (UACMP) WITH MICROSCOPIC
Bilirubin Urine: NEGATIVE
Glucose, UA: NEGATIVE mg/dL
Ketones, ur: NEGATIVE mg/dL
Nitrite: NEGATIVE
Protein, ur: NEGATIVE mg/dL
Specific Gravity, Urine: 1.02 (ref 1.005–1.030)
pH: 7 (ref 5.0–8.0)

## 2019-07-25 LAB — COMPREHENSIVE METABOLIC PANEL
ALT: 21 U/L (ref 0–44)
AST: 18 U/L (ref 15–41)
Albumin: 3.2 g/dL — ABNORMAL LOW (ref 3.5–5.0)
Alkaline Phosphatase: 118 U/L (ref 38–126)
Anion gap: 8 (ref 5–15)
BUN: 16 mg/dL (ref 8–23)
CO2: 26 mmol/L (ref 22–32)
Calcium: 8.5 mg/dL — ABNORMAL LOW (ref 8.9–10.3)
Chloride: 100 mmol/L (ref 98–111)
Creatinine, Ser: 0.56 mg/dL (ref 0.44–1.00)
GFR calc Af Amer: >60 mL/min (ref >60)
GFR calc non Af Amer: >60 mL/min (ref >60)
Glucose, Bld: 94 mg/dL (ref 70–99)
Potassium: 3.9 mmol/L (ref 3.5–5.1)
Sodium: 134 mmol/L — ABNORMAL LOW (ref 135–145)
Total Bilirubin: 0.8 mg/dL (ref 0.3–1.2)
Total Protein: 7.1 g/dL (ref 6.5–8.1)

## 2019-07-25 LAB — CBC
HCT: 38.4 % (ref 36.0–46.0)
Hemoglobin: 12.5 g/dL (ref 12.0–15.0)
MCH: 27.7 pg (ref 26.0–34.0)
MCHC: 32.6 g/dL (ref 30.0–36.0)
MCV: 85.1 fL (ref 80.0–100.0)
Platelets: 278 10*3/uL (ref 150–400)
RBC: 4.51 MIL/uL (ref 3.87–5.11)
RDW: 13.4 % (ref 11.5–15.5)
WBC: 15.8 10*3/uL — ABNORMAL HIGH (ref 4.0–10.5)
nRBC: 0 % (ref 0.0–0.2)

## 2019-07-25 LAB — LIPASE, BLOOD: Lipase: 17 U/L (ref 11–51)

## 2019-07-25 LAB — TROPONIN I (HIGH SENSITIVITY): Troponin I (High Sensitivity): 5 ng/L (ref <18)

## 2019-07-25 MED ORDER — PIPERACILLIN-TAZOBACTAM 3.375 G IVPB
3.3750 g | Freq: Three times a day (TID) | INTRAVENOUS | Status: DC
  Administered 2019-07-25 – 2019-07-28 (×8): 3.375 g via INTRAVENOUS
  Filled 2019-07-25 (×8): qty 50

## 2019-07-25 MED ORDER — KETOROLAC TROMETHAMINE 15 MG/ML IJ SOLN
15.0000 mg | Freq: Four times a day (QID) | INTRAMUSCULAR | Status: DC
  Administered 2019-07-26 – 2019-07-27 (×6): 15 mg via INTRAVENOUS
  Filled 2019-07-25 (×7): qty 1

## 2019-07-25 MED ORDER — LACTATED RINGERS IV SOLN: INTRAVENOUS | Status: DC

## 2019-07-25 MED ORDER — MONTELUKAST SODIUM 10 MG PO TABS
10.0000 mg | ORAL_TABLET | Freq: Every day | ORAL | Status: DC
  Administered 2019-07-25 – 2019-07-27 (×3): 10 mg via ORAL
  Filled 2019-07-25 (×3): qty 1

## 2019-07-25 MED ORDER — LEVOTHYROXINE SODIUM 112 MCG PO TABS
112.0000 ug | ORAL_TABLET | Freq: Every day | ORAL | Status: DC
  Administered 2019-07-26 – 2019-07-28 (×3): 112 ug via ORAL
  Filled 2019-07-25 (×3): qty 1

## 2019-07-25 MED ORDER — IOHEXOL 300 MG/ML  SOLN
100.0000 mL | Freq: Once | INTRAMUSCULAR | Status: AC | PRN
  Administered 2019-07-25: 100 mL via INTRAVENOUS

## 2019-07-25 MED ORDER — ACETAMINOPHEN 500 MG PO TABS
1000.0000 mg | ORAL_TABLET | Freq: Four times a day (QID) | ORAL | Status: DC
  Administered 2019-07-26 – 2019-07-28 (×6): 1000 mg via ORAL
  Filled 2019-07-25 (×7): qty 2

## 2019-07-25 MED ORDER — MOMETASONE FURO-FORMOTEROL FUM 200-5 MCG/ACT IN AERO
2.0000 | INHALATION_SPRAY | Freq: Two times a day (BID) | RESPIRATORY_TRACT | Status: DC
  Administered 2019-07-25 – 2019-07-28 (×5): 2 via RESPIRATORY_TRACT
  Filled 2019-07-25: qty 8.8

## 2019-07-25 MED ORDER — ONDANSETRON HCL 4 MG/2ML IJ SOLN: 4.0000 mg | Freq: Four times a day (QID) | INTRAMUSCULAR | Status: DC | PRN

## 2019-07-25 MED ORDER — PANTOPRAZOLE SODIUM 40 MG IV SOLR
40.0000 mg | Freq: Every day | INTRAVENOUS | Status: DC
  Administered 2019-07-25 – 2019-07-26 (×2): 40 mg via INTRAVENOUS
  Filled 2019-07-25 (×2): qty 40

## 2019-07-25 MED ORDER — OLOPATADINE HCL 0.1 % OP SOLN
1.0000 [drp] | Freq: Every day | OPHTHALMIC | Status: DC
  Administered 2019-07-25 – 2019-07-27 (×3): 1 [drp] via OPHTHALMIC
  Filled 2019-07-25: qty 5

## 2019-07-25 MED ORDER — ENOXAPARIN SODIUM 40 MG/0.4ML ~~LOC~~ SOLN
40.0000 mg | SUBCUTANEOUS | Status: DC
  Administered 2019-07-26 – 2019-07-27 (×2): 40 mg via SUBCUTANEOUS
  Filled 2019-07-25 (×3): qty 0.4

## 2019-07-25 MED ORDER — HYDROMORPHONE HCL 1 MG/ML IJ SOLN: 0.5000 mg | INTRAMUSCULAR | Status: DC | PRN

## 2019-07-25 MED ORDER — ONDANSETRON 4 MG PO TBDP: 4.0000 mg | ORAL_TABLET | Freq: Four times a day (QID) | ORAL | Status: DC | PRN

## 2019-07-25 NOTE — ED Provider Notes (Signed)
[name], [name]   Name: [name] DOB: 1924-11-27 MRN: [id number] CSN: [id number] PCP: Ezequiel Kayser, MD  Arrival date and time:  07/25/19 1311  Chief Complaint:  Abdominal Pain  NOTE: Prior to seeing the patient today, I have reviewed the triage nursing documentation and vital signs. Clinical staff has updated patient's PMH/PSHx, current medication list, and drug allergies/intolerances to ensure comprehensive history available to assist in medical decision making.   History:   HPI: [name] is a 86 y.o. female who presents today with complaints of diffuse abdominal pain that has been intermittent since Monday (07/22/2019). Patient reports that she felt a "severe ball of gas that would not pass" last night. She notes that her abdomen feels "bloated and distended". Pain is markedly worse over the RIGHT lower quadrant of her abdomen; described as being sharp in nature. Patient denies fever, chills, or other systemic signs of infection; no hypotension, tachycardia, nausea, vomiting, or vertiginous symptoms. She has had some mild diarrhea; has not appreciated any blood or mucous. Patient is eating and drinking well. She denies any urinary symptoms; no dysuria, frequency, or urgency. She notes that she has never experienced pain like this in the past. PMH (+) for known diverticular disease. Patient has never had any abdominal surgeries. Despite her symptoms, patient has not taken any over the counter interventions to help improve/relieve her reported symptoms at home. Patient presents with significant pain in her abdomen today; pain rated 9/10. Last oral intake was around 0730; intake consisted of a Boost nutritional shake. PMH (+) A.fib for which patient is on daily apixaban; last dose yesterday.   Past Medical History:  Diagnosis Date  . Arthritis   . Asthma   . Atrial fibrillation ([place])   . Cataract   . Eczema   . GERD (gastroesophageal reflux disease)   . Gout of wrist     . Heart murmur   . Pacemaker   . Pseudogout of ankle or foot   . Thyroid disease   . TIA (transient ischemic attack)     Past Surgical History:  Procedure Laterality Date  . ABLATION    . EYE SURGERY    . foot sugery    . HIP ARTHROPLASTY     right  . IMPLANTABLE CARDIOVERTER DEFIBRILLATOR (ICD) GENERATOR CHANGE Left 05/29/2019   Procedure: PACEMAKER CHANGE OUT;  Surgeon: Isaias Cowman, MD;  Location: [facility] ORS;  Service: Cardiovascular;  Laterality: Left;  . PACEMAKER INSERTION      Family History  Problem Relation Age of Onset  . Leukemia Father   . Lung cancer Sister   . Breast cancer Neg Hx     Social History   Tobacco Use  . Smoking status: Never Smoker  . Smokeless tobacco: Never Used  Substance Use Topics  . Alcohol use: Not Currently    Alcohol/week: 0.0 standard drinks  . Drug use: No    Patient Active Problem List   Diagnosis Date Noted  . Acute appendicitis 07/25/2019  . Leukocytosis 06/29/2016  . Hypogammaglobulinemia ([place]) 06/29/2016  . Temporary cerebral vascular dysfunction 02/28/2014  . DD (diverticular disease) 02/28/2014  . Allergic rhinitis 02/28/2014  . Allergy status to sulfonamides 02/28/2014  . History of anticoagulant therapy 02/28/2014  . Arthritis 02/28/2014  . Airway hyperreactivity 02/28/2014  . A-fib ([place]) 02/28/2014  . Bilateral cataracts 02/28/2014  . Dermatitis, eczematoid 02/28/2014  . Allergy to environmental factors 02/28/2014  . Acid reflux 02/28/2014  . Polypharmacy 02/28/2014  . HLD (hyperlipidemia) 02/28/2014  .  Adult hypothyroidism 02/28/2014  . BP (high blood pressure) 02/28/2014  . Arthritis, degenerative 02/28/2014  . Degenerative arthritis of hip 10/09/2013  . 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 Neck rigid 05/30/2013  . 1st degree AV block 10/10/2012  . History of biliary T-tube placement 06/14/2012  . History of cardioversion 06/05/2012  . Avascular necrosis of femoral head  ([place]) 06/01/2012  . Lumbar radiculopathy 06/01/2012  . DDD (degenerative disc disease), lumbar 06/01/2012  . H/O cardiac catheterization 10/20/2009    Home Medications:    Current Meds  Medication Sig  . acetaminophen (TYLENOL) 650 MG CR tablet Take 650 mg by mouth every 8 (eight) hours as needed for pain.  Marland Kitchen apixaban (ELIQUIS) 5 MG TABS tablet Take 5 mg by mouth 2 (two) times daily.   Marland Kitchen atorvastatin (LIPITOR) 40 MG tablet Take 40 mg by mouth every evening.   . budesonide-formoterol (SYMBICORT) 160-4.5 MCG/ACT inhaler Inhale 2 puffs into the lungs 2 (two) times daily.   . Cholecalciferol (VITAMIN D3) 50 MCG (2000 UT) TABS Take 2,000 Units by mouth daily.   . diclofenac Sodium (VOLTAREN) 1 % GEL Apply 2 g topically daily as needed (pain).   . famotidine (PEPCID) 20 MG tablet Take 20 mg by mouth 2 (two) times daily.   Marland Kitchen levothyroxine (SYNTHROID, LEVOTHROID) 112 MCG tablet Take 112 mcg by mouth daily before breakfast.  . montelukast (SINGULAIR) 10 MG tablet Take 10 mg by mouth at bedtime.   . Olopatadine HCl (PAZEO) 0.7 % SOLN Apply 1 drop to eye at bedtime.   . Omega-3 1000 MG CAPS Take 1,000 mg by mouth daily.   . [DISCONTINUED] ASPERCREME LIDOCAINE EX Apply 1 application topically daily as needed (Pain).    Allergies:   Fluticasone-salmeterol, Albuterol, Ciprofloxacin, Codeine, Colchicine, Promethazine, Valacyclovir, Guaifenesin, and Sulfa antibiotics  Review of Systems (ROS):  Review of systems NEGATIVE unless otherwise noted in narrative H&P section.   Vital Signs: Today's Vitals   07/25/19 1328 07/25/19 1329 07/25/19 1330 07/25/19 1547  BP:   112/79   Pulse:   83   Temp:   97.9 F (36.6 C)   TempSrc:   Oral   SpO2:   99%   Weight:  138 lb (62.6 kg)    Height:  5\' 3"  (1.6 m)    PainSc: 9    9     Physical Exam: Physical Exam  Constitutional: She is oriented to person, place, and time and well-developed, well-nourished, and in no distress.  HENT:  Head: Normocephalic  and atraumatic.  Eyes: Pupils are equal, round, and reactive to light.  Cardiovascular: Normal rate, regular rhythm, normal heart sounds and intact distal pulses.  Pulmonary/Chest: Effort normal and breath sounds normal.  Abdominal: Soft. Normal appearance and bowel sounds are normal. She exhibits distension (mild). There is abdominal tenderness in the right lower quadrant. There is tenderness at McBurney's point. There is no rebound, no guarding and no CVA tenderness.  Neurological: She is alert and oriented to person, place, and time. Gait normal.  Skin: Skin is warm and dry. No rash noted. She is not diaphoretic.  Psychiatric: Memory, affect and judgment normal. Her mood appears anxious.  Nursing note and vitals reviewed.   Urgent Care Treatments / Results:   LABS: PLEASE NOTE: all labs that were ordered this encounter are listed, however only abnormal results are displayed. Labs Reviewed  CBC - Abnormal; Notable for the following components:      Result Value   WBC  15.8 (*)    All other components within normal limits  COMPREHENSIVE METABOLIC PANEL - Abnormal; Notable for the following components:   Sodium 134 (*)    Calcium 8.5 (*)    Albumin 3.2 (*)    All other components within normal limits  URINALYSIS, COMPLETE (UACMP) WITH MICROSCOPIC - Abnormal; Notable for the following components:   Hgb urine dipstick SMALL (*)    Leukocytes,Ua TRACE (*)    Bacteria, UA FEW (*)    All other components within normal limits  URINE CULTURE  LIPASE, BLOOD  TROPONIN I (HIGH SENSITIVITY)    EKG: -None  RADIOLOGY: CT ABDOMEN PELVIS W CONTRAST  Result Date: 07/25/2019 CLINICAL DATA:  Right lower quadrant pain, appendicitis suspected EXAM: CT ABDOMEN AND PELVIS WITH CONTRAST TECHNIQUE: Multidetector CT imaging of the abdomen and pelvis was performed using the standard protocol following bolus administration of intravenous contrast. CONTRAST:  117mL OMNIPAQUE IOHEXOL 300 MG/ML  SOLN  COMPARISON:  02/24/2013 FINDINGS: Lower chest: No acute abnormality. Hepatobiliary: No solid liver abnormality is seen. No gallstones, gallbladder wall thickening, or biliary dilatation. Pancreas: Unremarkable. No pancreatic ductal dilatation or surrounding inflammatory changes. Spleen: Normal in size without significant abnormality. Adrenals/Urinary Tract: Adrenal glands are unremarkable. Kidneys are normal, without renal calculi, solid lesion, or hydronephrosis. Bladder is unremarkable. Stomach/Bowel: Stomach is within normal limits. The appendix is thickened, hyperemic, and enlarged to approximately 1.2 cm. The mucosa is indistinct near the appendiceal tip with a possible small adjacent fluid collection measuring 1.2 cm (series 2, image 67). Sigmoid diverticulosis. Vascular/Lymphatic: Aortic atherosclerosis. No enlarged abdominal or pelvic lymph nodes. Reproductive: No mass or other significant abnormality. Other: No abdominal wall hernia or abnormality. Small volume fluid in the right lower quadrant and low pelvis. Musculoskeletal: No acute or significant osseous findings. IMPRESSION: 1. The appendix is thickened, hyperemic, and enlarged approximately 1.2 cm in caliber. The mucosa is indistinct near the appendiceal tip with a possible small adjacent fluid collection measuring 1.2 cm, concerning for perforation and abscess formation. There is no overt pneumoperitoneum. 2. Small volume fluid in the right lower quadrant and low pelvis, likely reactive. 3.  Aortic Atherosclerosis (ICD10-I70.0). These results were called by telephone at the time of interpretation on 07/25/2019 at 3:17 pm to NP Honor Loh , who verbally acknowledged these results. Electronically Signed   By: Eddie Candle M.D.   On: 07/25/2019 15:20    PROCEDURES: Procedures  MEDICATIONS RECEIVED THIS VISIT: Medications - No data to display  PERTINENT CLINICAL COURSE NOTES/UPDATES: Clinical Course as of Jul 24 1857  Thu Jul 25, 2019  1533  Spoke with general surgery attending on call Hampton Abbot, MD) via [facility]. MD made aware of presenting complaints, assessment, and workup. MD advised that patient was being transferred from [facility] to [facility] ED for further evaluation and admission. MD acknowledged and appreciative of pre-arrival communication.    [BG]    Clinical Course User Index [BG] Karen Kitchens, NP   Initial Impression / Assessment and Plan / Urgent Care Course:  Pertinent labs & imaging results that were available during my care of the patient were personally reviewed by me and considered in my medical decision making (see lab/imaging section of note for values and interpretations).  [name] is a 86 y.o. female who presents to [facility] today with complaints of Abdominal Pain  Patient is well appearing overall in clinic today. She does not appear to be in any acute distress. Presenting symptoms (see HPI) and exam as  documented above. Exam reveals diffuse abdominal tenderness with (+) mild distention. She advises that pain is worse in the RIGHT lower quadrant. No nausea, vomiting, or fevers. (+) PO intake. Will pursue workup as follows:   WBC 15.8. Hgb 12.5, Hct 38.4, MCV 85.1, MCH 27.7, and platelets 278 K/uL.    Na 134, K+ 3.9, glucose 94, BUN 16, and creatinine 0.56 mg/dL. Estimated Creatinine Clearance: 37.1 mL/min (by C-G formula based on SCr of 0.56 mg/dL).    Lipase 17 U/L. hs-TnI 5 ng/L   UA shows 0-5 WBC/hpf, 6-10 RBC/hpf, few bacteria, trace LE; no nitrites. Reflex culture sent.    EKG ordered, however inadvertently not obtained prior to patient discharge/transfer.    CT scan of the abdomen/pelvis with contrast revealed acute appendicitis with (+) periappendiceal fluid collection concerning for a ruptured appendix and abscess formation.   Results discussed with patient. Reviewed labs and CT findings that revealed about appendicitis with possible rupture. Discussed potential need for surgical  intervention, however based on her age and comorbidities, I cannot say if immediate surgical intervention will be the intended course of treatment. Patient's presenting symptoms today require further evaluation and intervention in a setting that is capable of providing a higher level of care. Patient is going to need IVFs, IV ABX, and probable surgical intervention. I discussed with patient that her problems today are outside of the capabilities of this outpatient urgent care setting, thus my recommendations are to have her seen in the emergency department at [facility]. Patient is asking if she has to go today. Discussed that delaying care any further could result overwhelming infection, sepsis, and death. Patient amenable to transfer. Discussed need for transfer via EMS due to her current clinical condition; patient and family agree. PIV is in place. EMS called for transport.   Patient report called to [facility] emergency department staff; spoke with Lillia Carmel, RN.  Nurse was advised of patient's presenting complaints, assessment in clinic, findings from work up thus far, and plans for patient to present there for ongoing evaluation and management of her confirmed acute appendicitis. Questions fielded. Nurse advised to return a call to [facility] with any questions or concerns pertaining to the care that Joline Salt received here today. Hospital staff aware that patient will be presenting to their facility via EMS transport today.   Final Clinical Impressions / Urgent Care Diagnoses:   Final diagnoses:  Acute appendicitis, unspecified acute appendicitis type    New Prescriptions:  [place] Controlled Substance Registry consulted? Not Applicable  No orders of the defined types were placed in this encounter.   Recommended Follow up Care:   Follow-up Information    [facility] EMERGENCY DEPARTMENT.   Specialty: Emergency Medicine Why: EMS WILL TAKE YOU TO THE ER FOR FURTHER  EVALUATION AND ADMISSION. Contact information: [place] [id number] [place] [phone]        NOTE: This note was prepared using Dragon dictation software along with smaller phrase technology. Despite my best ability to proofread, there is the potential that transcriptional errors may still occur from this process, and are completely unintentional.    Karen Kitchens, NP 07/25/19 1901

## 2019-07-25 NOTE — Discharge Instructions (Signed)
EMS WILL TAKE YOU TO THE ED FOR FURTHER EVALUATION AND TREATMENT. YOU WILL BE ADMITTED.   Honor Loh, MSN, APRN, FNP-C, CEN Advanced Practice Provider [facility] 07/25/2019 3:24 PM

## 2019-07-25 NOTE — ED Triage Notes (Signed)
PT to ED via Ems from [facility]. At urgent care was dx with an acute appendicitis. PT c/o feeling like a ball of gas in RLQ. No fevers. WBC 16. PT is alert and oriented.

## 2019-07-25 NOTE — H&P (Signed)
Date of Admission:  07/25/2019  Reason for Admission:  Acute appendicitis  History of Present Illness: [name] is a 86 y.o. female presenting with a  4-day history of right lower quadrant abdominal pain.  Patient reports that at first she thought she was very gassy and bloated and after having some additional flatus, she felt better.  However, then she started feeling worse again, with some tenderness in the right lower quadrant and decided to go to Urgent Care to be evaluated.  She also had some mild diarrhea but reports was non-bloody.  Denies any nausea or vomiting.  Denies any fevers, chills, chest pain, or shortness of breath.    At Urgent care, her workup included labs and CT scan.  Her labs showed an elevated WBC of 15.8.  Her CT scan shows acute appendicitis with a dilated appendix with concerns for distal rupture.  I have independently viewed the images and agree with the findings.  There is no clear abscess and no free air.  She was brought to the ED via EMS for further care.  Past Medical History: Past Medical History:  Diagnosis Date  . Arthritis   . Asthma   . Atrial fibrillation ([place])   . Cataract   . Eczema   . GERD (gastroesophageal reflux disease)   . Gout of wrist   . Heart murmur   . Pacemaker   . Pseudogout of ankle or foot   . Thyroid disease   . TIA (transient ischemic attack)      Past Surgical History: Past Surgical History:  Procedure Laterality Date  . ABLATION    . EYE SURGERY    . foot sugery    . HIP ARTHROPLASTY     right  . IMPLANTABLE CARDIOVERTER DEFIBRILLATOR (ICD) GENERATOR CHANGE Left 05/29/2019   Procedure: PACEMAKER CHANGE OUT;  Surgeon: Isaias Cowman, MD;  Location: [facility] ORS;  Service: Cardiovascular;  Laterality: Left;  . PACEMAKER INSERTION      Home Medications: Prior to Admission medications   Medication Sig Start Date End Date Taking? Authorizing Provider  acetaminophen (TYLENOL) 650 MG CR tablet Take 650 mg by  mouth every 8 (eight) hours as needed for pain.   Yes [provider]  apixaban (ELIQUIS) 5 MG TABS tablet Take 5 mg by mouth 2 (two) times daily.  06/26/13  Yes [provider]  atorvastatin (LIPITOR) 40 MG tablet Take 40 mg by mouth every evening.    Yes [provider]  budesonide-formoterol (SYMBICORT) 160-4.5 MCG/ACT inhaler Inhale 2 puffs into the lungs 2 (two) times daily.  09/09/13  Yes [provider]  Cholecalciferol (VITAMIN D3) 50 MCG (2000 UT) TABS Take 2,000 Units by mouth daily.    Yes [provider]  diclofenac Sodium (VOLTAREN) 1 % GEL Apply 2 g topically daily as needed (pain).  08/22/13  Yes [provider]  famotidine (PEPCID) 20 MG tablet Take 20 mg by mouth 2 (two) times daily.    Yes [provider]  levothyroxine (SYNTHROID, LEVOTHROID) 112 MCG tablet Take 112 mcg by mouth daily before breakfast.   Yes [provider]  montelukast (SINGULAIR) 10 MG tablet Take 10 mg by mouth at bedtime.  12/27/13  Yes [provider]  Olopatadine HCl (PAZEO) 0.7 % SOLN Apply 1 drop to eye at bedtime.    Yes [provider]  Omega-3 1000 MG CAPS Take 1,000 mg by mouth daily.    Yes [provider]    Allergies:  Allergies  Allergen Reactions  . Fluticasone-Salmeterol Anaphylaxis  . Albuterol     Other reaction(s): Other (See Comments) It burns and makes her cough "too strong for me or something"  . Ciprofloxacin Other (See Comments)    Other reaction(s): Unknown Does not remember  . Codeine Hives  . Colchicine Diarrhea  . Promethazine Other (See Comments)    Unable to speak. Loss muscle control (PHENGERAN)  . Valacyclovir     Other reaction(s): Muscle Pain Could hardly move  . Guaifenesin Palpitations  . Sulfa Antibiotics Rash    Social History:  reports that she has never smoked. She has never used smokeless tobacco. She reports previous alcohol use. She reports that she does not use  drugs.   Family History: Family History  Problem Relation Age of Onset  . Leukemia Father   . Lung cancer Sister   . Breast cancer Neg Hx     Review of Systems: Review of Systems  Constitutional: Negative for chills and fever.  HENT: Negative for hearing loss.   Respiratory: Negative for shortness of breath.   Cardiovascular: Negative for chest pain.  Gastrointestinal: Positive for abdominal pain and diarrhea. Negative for blood in stool, constipation, nausea and vomiting.  Genitourinary: Negative for dysuria.  Musculoskeletal: Negative for myalgias.  Skin: Negative for rash.  Neurological: Negative for dizziness.  Psychiatric/Behavioral: Negative for depression.    Physical Exam BP 131/80 (BP Location: Left Arm)   Temp 98.3 F (36.8 C)   Resp 14   Ht 5\' 3"  (1.6 m)   Wt 62.6 kg   SpO2 100%   BMI 24.45 kg/m  CONSTITUTIONAL: No acute distress HEENT:  Normocephalic, atraumatic, extraocular motion intact. NECK: Trachea is midline, and there is no jugular venous distension.  RESPIRATORY:  Lungs are clear, and breath sounds are equal bilaterally. Normal respiratory effort without pathologic use of accessory muscles. CARDIOVASCULAR: Heart is regular without murmurs, gallops, or rubs. GI: The abdomen is soft, non-distended, with tenderness to palpation in the right lower quadrant.  No peritonitis.  MUSCULOSKELETAL:  Normal muscle strength and tone in all four extremities.  No peripheral edema or cyanosis. SKIN: Skin turgor is normal. There are no pathologic skin lesions.  NEUROLOGIC:  Motor and sensation is grossly normal.  Cranial nerves are grossly intact. PSYCH:  Alert and oriented to person, place and time. Affect is normal.  Laboratory Analysis: Results for orders placed or performed during the hospital encounter of 07/25/19 (from the past 24 hour(s))  CBC     Status: Abnormal   Collection Time: 07/25/19  1:45 PM  Result Value Ref Range   WBC 15.8 (H) 4.0 - 10.5 K/uL    RBC 4.51 3.87 - 5.11 MIL/uL   Hemoglobin 12.5 12.0 - 15.0 g/dL   HCT 38.4 36.0 - 46.0 %   MCV 85.1 80.0 - 100.0 fL   MCH 27.7 26.0 - 34.0 pg   MCHC 32.6 30.0 - 36.0 g/dL   RDW 13.4 11.5 - 15.5 %   Platelets 278 150 - 400 K/uL   nRBC 0.0 0.0 - 0.2 %  Comprehensive metabolic panel     Status: Abnormal   Collection Time: 07/25/19  1:45 PM  Result Value Ref Range   Sodium 134 (L) 135 - 145 mmol/L   Potassium 3.9 3.5 - 5.1 mmol/L   Chloride 100 98 - 111 mmol/L   CO2 26 22 - 32 mmol/L   Glucose, Bld 94 70 - 99 mg/dL   BUN 16 8 -  23 mg/dL   Creatinine, Ser 0.56 0.44 - 1.00 mg/dL   Calcium 8.5 (L) 8.9 - 10.3 mg/dL   Total Protein 7.1 6.5 - 8.1 g/dL   Albumin 3.2 (L) 3.5 - 5.0 g/dL   AST 18 15 - 41 U/L   ALT 21 0 - 44 U/L   Alkaline Phosphatase 118 38 - 126 U/L   Total Bilirubin 0.8 0.3 - 1.2 mg/dL   GFR calc non Af Amer >60 >60 mL/min   GFR calc Af Amer >60 >60 mL/min   Anion gap 8 5 - 15  Lipase, blood     Status: None   Collection Time: 07/25/19  1:45 PM  Result Value Ref Range   Lipase 17 11 - 51 U/L  Urinalysis, Complete w Microscopic     Status: Abnormal   Collection Time: 07/25/19  1:45 PM  Result Value Ref Range   Color, Urine YELLOW YELLOW   APPearance CLEAR CLEAR   Specific Gravity, Urine 1.020 1.005 - 1.030   pH 7.0 5.0 - 8.0   Glucose, UA NEGATIVE NEGATIVE mg/dL   Hgb urine dipstick SMALL (A) NEGATIVE   Bilirubin Urine NEGATIVE NEGATIVE   Ketones, ur NEGATIVE NEGATIVE mg/dL   Protein, ur NEGATIVE NEGATIVE mg/dL   Nitrite NEGATIVE NEGATIVE   Leukocytes,Ua TRACE (A) NEGATIVE   Squamous Epithelial / LPF 0-5 0 - 5   WBC, UA 0-5 0 - 5 WBC/hpf   RBC / HPF 6-10 0 - 5 RBC/hpf   Bacteria, UA FEW (A) NONE SEEN   Mucus PRESENT   Troponin I (High Sensitivity)     Status: None   Collection Time: 07/25/19  1:45 PM  Result Value Ref Range   Troponin I (High Sensitivity) 5 <18 ng/L    Imaging: CT ABDOMEN PELVIS W CONTRAST  Result Date: 07/25/2019 CLINICAL DATA:  Right  lower quadrant pain, appendicitis suspected EXAM: CT ABDOMEN AND PELVIS WITH CONTRAST TECHNIQUE: Multidetector CT imaging of the abdomen and pelvis was performed using the standard protocol following bolus administration of intravenous contrast. CONTRAST:  12mL OMNIPAQUE IOHEXOL 300 MG/ML  SOLN COMPARISON:  02/24/2013 FINDINGS: Lower chest: No acute abnormality. Hepatobiliary: No solid liver abnormality is seen. No gallstones, gallbladder wall thickening, or biliary dilatation. Pancreas: Unremarkable. No pancreatic ductal dilatation or surrounding inflammatory changes. Spleen: Normal in size without significant abnormality. Adrenals/Urinary Tract: Adrenal glands are unremarkable. Kidneys are normal, without renal calculi, solid lesion, or hydronephrosis. Bladder is unremarkable. Stomach/Bowel: Stomach is within normal limits. The appendix is thickened, hyperemic, and enlarged to approximately 1.2 cm. The mucosa is indistinct near the appendiceal tip with a possible small adjacent fluid collection measuring 1.2 cm (series 2, image 67). Sigmoid diverticulosis. Vascular/Lymphatic: Aortic atherosclerosis. No enlarged abdominal or pelvic lymph nodes. Reproductive: No mass or other significant abnormality. Other: No abdominal wall hernia or abnormality. Small volume fluid in the right lower quadrant and low pelvis. Musculoskeletal: No acute or significant osseous findings. IMPRESSION: 1. The appendix is thickened, hyperemic, and enlarged approximately 1.2 cm in caliber. The mucosa is indistinct near the appendiceal tip with a possible small adjacent fluid collection measuring 1.2 cm, concerning for perforation and abscess formation. There is no overt pneumoperitoneum. 2. Small volume fluid in the right lower quadrant and low pelvis, likely reactive. 3.  Aortic Atherosclerosis (ICD10-I70.0). These results were called by telephone at the time of interpretation on 07/25/2019 at 3:17 pm to NP Honor Loh , who verbally  acknowledged these results. Electronically Signed   By: Cristie Hem  Laqueta Carina M.D.   On: 07/25/2019 15:20    Assessment and Plan: This is a 86 y.o. female with acute appendicitis, possibly ruptured.  Discussed with the patient about the findings on her CT scan concerning for acute appendicitis and the possibility of rupture.  There is no free air and no clear abscess at this point.  Her exam is non-toxic and she's not peritoneal.  She is on Eliquis for her atrial fibrillation, and discussed with her that in that setting, I would want to treat with her conservative management with antibiotic therapy, NPO diet, IV fluid hydration.  If clinically she improves, then we would advance her diet and eventually discharge home, with the thought of a possible interval appendectomy.  However, if she deteriorates, we would go to the OR for appendectomy.  As a precaution, we'll hold the Eliquis in case she does need surgery or procedures during this stay.  Patient understands this plan and all of her questions were answered.   Melvyn Neth, MD [facility] Pg:  [phone]

## 2019-07-25 NOTE — ED Triage Notes (Addendum)
Pt presents with c/o abdominal pain for several days. She denies n/v but does report some mild diarrhea. She also reports "ball of gas" in her belly that she "couldn't get to move". Pt states pain is mostly located in RLQ but there is some diffuse pain across her lower abdomen.

## 2019-07-25 NOTE — ED Notes (Signed)
Sent pharmacy message regarding unverified antibiotics

## 2019-07-25 NOTE — ED Notes (Signed)
Dr. Hampton Abbot at bedside.

## 2019-07-25 NOTE — ED Provider Notes (Signed)
[facility] Emergency Department Provider Note   ____________________________________________   First MD Initiated Contact with Patient 07/25/19 1614     (approximate)  I have reviewed the triage vital signs and the nursing notes.   HISTORY  Chief Complaint Abdominal Pain    HPI [name] is a 86 y.o. female here for evaluation of abdominal pain  Patient reported Monday she started feeling a crampy gassy feeling in her right lower abdomen.  Felt like a bubble.  Seems to not gone away and got a little worse today  She went to urgent care to be evaluated.  There they did a CT scan on her and transferred her for concerns of possible appendicitis  She reports she is not nauseated not much pain right now does not wish for any pain medication.  She was is hungry and would like something to eat, but is understanding my recommendation not to take anything in case she needs surgery  She last took her blood thinners she believes last night.  She did not take it today  She is on blood thinner due to her A. fib     Past Medical History:  Diagnosis Date  . Arthritis   . Asthma   . Atrial fibrillation ([place])   . Cataract   . Eczema   . GERD (gastroesophageal reflux disease)   . Gout of wrist   . Heart murmur   . Pacemaker   . Pseudogout of ankle or foot   . Thyroid disease   . TIA (transient ischemic attack)     Patient Active Problem List   Diagnosis Date Noted  . Acute appendicitis 07/25/2019  . Leukocytosis 06/29/2016  . Hypogammaglobulinemia ([place]) 06/29/2016  . Temporary cerebral vascular dysfunction 02/28/2014  . DD (diverticular disease) 02/28/2014  . Allergic rhinitis 02/28/2014  . Allergy status to sulfonamides 02/28/2014  . History of anticoagulant therapy 02/28/2014  . Arthritis 02/28/2014  . Airway hyperreactivity 02/28/2014  . A-fib ([place]) 02/28/2014  . Bilateral cataracts 02/28/2014  . Dermatitis, eczematoid 02/28/2014    . Allergy to environmental factors 02/28/2014  . Acid reflux 02/28/2014  . Polypharmacy 02/28/2014  . HLD (hyperlipidemia) 02/28/2014  . Adult hypothyroidism 02/28/2014  . BP (high blood pressure) 02/28/2014  . Arthritis, degenerative 02/28/2014  . Degenerative arthritis of hip 10/09/2013  . 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 Neck rigid 05/30/2013  . 1st degree AV block 10/10/2012  . History of biliary T-tube placement 06/14/2012  . History of cardioversion 06/05/2012  . Avascular necrosis of femoral head ([place]) 06/01/2012  . Lumbar radiculopathy 06/01/2012  . DDD (degenerative disc disease), lumbar 06/01/2012  . H/O cardiac catheterization 10/20/2009    Past Surgical History:  Procedure Laterality Date  . ABLATION    . EYE SURGERY    . foot sugery    . HIP ARTHROPLASTY     right  . IMPLANTABLE CARDIOVERTER DEFIBRILLATOR (ICD) GENERATOR CHANGE Left 05/29/2019   Procedure: PACEMAKER CHANGE OUT;  Surgeon: Isaias Cowman, MD;  Location: [facility] ORS;  Service: Cardiovascular;  Laterality: Left;  . PACEMAKER INSERTION      Prior to Admission medications   Medication Sig Start Date End Date Taking? Authorizing Provider  acetaminophen (TYLENOL) 650 MG CR tablet Take 650 mg by mouth every 8 (eight) hours as needed for pain.    [provider]  apixaban (ELIQUIS) 5 MG TABS tablet Take 5 mg by mouth 2 (two) times daily.  06/26/13  [provider]  ASPERCREME LIDOCAINE EX Apply 1 application topically daily as needed (Pain).    [provider]  atorvastatin (LIPITOR) 40 MG tablet Take 40 mg by mouth daily at 6 PM.  11/14/13   [provider]  budesonide-formoterol (SYMBICORT) 160-4.5 MCG/ACT inhaler Inhale 2 puffs into the lungs 2 (two) times daily.  09/09/13   [provider]  Cholecalciferol (VITAMIN D3) 50 MCG (2000 UT) TABS Take 2,000 Units by mouth daily.     [provider]  diclofenac Sodium  (VOLTAREN) 1 % GEL Apply 2 g topically daily as needed (pain).  08/22/13   [provider]  famotidine (PEPCID) 20 MG tablet Take 20 mg by mouth daily.    [provider]  levothyroxine (SYNTHROID, LEVOTHROID) 112 MCG tablet Take 112 mcg by mouth daily before breakfast.    [provider]  montelukast (SINGULAIR) 10 MG tablet Take 10 mg by mouth at bedtime.  12/27/13   [provider]  Olopatadine HCl (PAZEO) 0.7 % SOLN Apply 1 drop to eye at bedtime.     [provider]  Omega-3 1000 MG CAPS Take 1,200 mg by mouth daily.     [provider]    Allergies Fluticasone-salmeterol, Albuterol, Ciprofloxacin, Codeine, Colchicine, Promethazine, Valacyclovir, Guaifenesin, and Sulfa antibiotics  Family History  Problem Relation Age of Onset  . Leukemia Father   . Lung cancer Sister   . Breast cancer Neg Hx     Social History Social History   Tobacco Use  . Smoking status: Never Smoker  . Smokeless tobacco: Never Used  Substance Use Topics  . Alcohol use: Not Currently    Alcohol/week: 0.0 standard drinks  . Drug use: No    Review of Systems Constitutional: No fever/chills Eyes: No visual changes. Cardiovascular: Denies chest pain. Respiratory: Denies shortness of breath. Gastrointestinal: See HPI genitourinary: Negative for dysuria. Musculoskeletal: Negative for back pain. Skin: Negative for rash. Neurological: Negative for headaches, areas of focal weakness or numbness.  Denies any exposure to Covid  ____________________________________________   PHYSICAL EXAM:  VITAL SIGNS: ED Triage Vitals  Enc Vitals Group     BP 07/25/19 1628 131/80     Pulse --      Resp 07/25/19 1628 14     Temp 07/25/19 1628 98.3 F (36.8 C)     Temp src --      SpO2 --      Weight 07/25/19 1618 138 lb 0.1 oz (62.6 kg)     Height 07/25/19 1618 5\' 3"  (1.6 m)     Head Circumference --      Peak Flow --      Pain Score 07/25/19 1628 3      Pain Loc --      Pain Edu? --      Excl. in [place]? --     Constitutional: Alert and oriented. Well appearing and in no acute distress. Eyes: Conjunctivae are normal. Head: Atraumatic. Nose: No congestion/rhinnorhea. Mouth/Throat: Mucous membranes are moist. Neck: No stridor.  Cardiovascular: Normal rate, regular rhythm. Grossly normal heart sounds.  Good peripheral circulation. Respiratory: Normal respiratory effort.  No retractions. Lungs CTAB. Gastrointestinal: Soft and nontender except for mild tenderness in the right lower quadrant without rebound or guarding. No distention. Musculoskeletal: No lower extremity tenderness nor edema. Neurologic:  Normal speech and language. No gross focal neurologic deficits are appreciated.  Skin:  Skin is warm, dry and intact. No rash noted. Psychiatric: Mood and affect are  normal. Speech and behavior are normal.  ____________________________________________   LABS (all labs ordered are listed, but only abnormal results are displayed)  Labs Reviewed  SARS CORONAVIRUS 2 (TAT 6-24 HRS)   ____________________________________________  EKG   ____________________________________________  RADIOLOGY  CT ABDOMEN PELVIS W CONTRAST  Result Date: 07/25/2019 CLINICAL DATA:  Right lower quadrant pain, appendicitis suspected EXAM: CT ABDOMEN AND PELVIS WITH CONTRAST TECHNIQUE: Multidetector CT imaging of the abdomen and pelvis was performed using the standard protocol following bolus administration of intravenous contrast. CONTRAST:  135mL OMNIPAQUE IOHEXOL 300 MG/ML  SOLN COMPARISON:  02/24/2013 FINDINGS: Lower chest: No acute abnormality. Hepatobiliary: No solid liver abnormality is seen. No gallstones, gallbladder wall thickening, or biliary dilatation. Pancreas: Unremarkable. No pancreatic ductal dilatation or surrounding inflammatory changes. Spleen: Normal in size without significant abnormality. Adrenals/Urinary Tract: Adrenal glands are unremarkable.  Kidneys are normal, without renal calculi, solid lesion, or hydronephrosis. Bladder is unremarkable. Stomach/Bowel: Stomach is within normal limits. The appendix is thickened, hyperemic, and enlarged to approximately 1.2 cm. The mucosa is indistinct near the appendiceal tip with a possible small adjacent fluid collection measuring 1.2 cm (series 2, image 67). Sigmoid diverticulosis. Vascular/Lymphatic: Aortic atherosclerosis. No enlarged abdominal or pelvic lymph nodes. Reproductive: No mass or other significant abnormality. Other: No abdominal wall hernia or abnormality. Small volume fluid in the right lower quadrant and low pelvis. Musculoskeletal: No acute or significant osseous findings. IMPRESSION: 1. The appendix is thickened, hyperemic, and enlarged approximately 1.2 cm in caliber. The mucosa is indistinct near the appendiceal tip with a possible small adjacent fluid collection measuring 1.2 cm, concerning for perforation and abscess formation. There is no overt pneumoperitoneum. 2. Small volume fluid in the right lower quadrant and low pelvis, likely reactive. 3.  Aortic Atherosclerosis (ICD10-I70.0). These results were called by telephone at the time of interpretation on 07/25/2019 at 3:17 pm to NP Honor Loh , who verbally acknowledged these results. Electronically Signed   By: Eddie Candle M.D.   On: 07/25/2019 15:20    CT scan reviewed, discussed with Dr. Hampton Abbot ____________________________________________   PROCEDURES  Procedure(s) performed: None  Procedures  Critical Care performed: No  ____________________________________________   INITIAL IMPRESSION / ASSESSMENT AND PLAN / ED COURSE  Pertinent labs & imaging results that were available during my care of the patient were reviewed by me and considered in my medical decision making (see chart for details).   Patient presents for right lower quadrant abdominal pain.  Leukocytosis.  Tenderness in the right lower quadrant.  Imaging  studies reviewed from the urgent care as well as lab work.  Significant concern for possible appendicitis.  Based on findings, patient's reported clinical history I have consulted Dr. Hampton Abbot from general surgery who is planning to admit the patient for further care management.  Patient understand agreeable with this plan.  Kemari Loga was evaluated in Emergency Department on 07/25/2019 for the symptoms described in the history of present illness. She was evaluated in the context of the global COVID-19 pandemic, which necessitated consideration that the patient might be at risk for infection with the SARS-CoV-2 virus that causes COVID-19. Institutional protocols and algorithms that pertain to the evaluation of patients at risk for COVID-19 are in a state of rapid change based on information released by regulatory bodies including the CDC and federal and state organizations. These policies and algorithms were followed during the patient's care in the ED.       ____________________________________________   FINAL CLINICAL IMPRESSION(S) / ED  DIAGNOSES  Final diagnoses:  Acute appendicitis, unspecified acute appendicitis type        Note:  This document was prepared using Dragon voice recognition software and may include unintentional dictation errors       Delman Kitten, MD 07/25/19 1643

## 2019-07-26 ENCOUNTER — Encounter: Payer: Self-pay | Admitting: Surgery

## 2019-07-26 ENCOUNTER — Other Ambulatory Visit: Payer: Self-pay

## 2019-07-26 LAB — CBC WITH DIFFERENTIAL/PLATELET
Abs Immature Granulocytes: 0.12 10*3/uL — ABNORMAL HIGH (ref 0.00–0.07)
Basophils Absolute: 0.1 10*3/uL (ref 0.0–0.1)
Basophils Relative: 1 %
Eosinophils Absolute: 0.1 10*3/uL (ref 0.0–0.5)
Eosinophils Relative: 1 %
HCT: 35.5 % — ABNORMAL LOW (ref 36.0–46.0)
Hemoglobin: 11.3 g/dL — ABNORMAL LOW (ref 12.0–15.0)
Immature Granulocytes: 1 %
Lymphocytes Relative: 9 %
Lymphs Abs: 1.3 10*3/uL (ref 0.7–4.0)
MCH: 27.6 pg (ref 26.0–34.0)
MCHC: 31.8 g/dL (ref 30.0–36.0)
MCV: 86.8 fL (ref 80.0–100.0)
Monocytes Absolute: 1.5 10*3/uL — ABNORMAL HIGH (ref 0.1–1.0)
Monocytes Relative: 11 %
Neutro Abs: 10.9 10*3/uL — ABNORMAL HIGH (ref 1.7–7.7)
Neutrophils Relative %: 77 %
Platelets: 259 10*3/uL (ref 150–400)
RBC: 4.09 MIL/uL (ref 3.87–5.11)
RDW: 13.4 % (ref 11.5–15.5)
WBC: 14 10*3/uL — ABNORMAL HIGH (ref 4.0–10.5)
nRBC: 0 % (ref 0.0–0.2)

## 2019-07-26 LAB — BASIC METABOLIC PANEL
Anion gap: 9 (ref 5–15)
BUN: 14 mg/dL (ref 8–23)
CO2: 26 mmol/L (ref 22–32)
Calcium: 8.2 mg/dL — ABNORMAL LOW (ref 8.9–10.3)
Chloride: 101 mmol/L (ref 98–111)
Creatinine, Ser: 0.75 mg/dL (ref 0.44–1.00)
GFR calc Af Amer: >60 mL/min (ref >60)
GFR calc non Af Amer: >60 mL/min (ref >60)
Glucose, Bld: 84 mg/dL (ref 70–99)
Potassium: 3.7 mmol/L (ref 3.5–5.1)
Sodium: 136 mmol/L (ref 135–145)

## 2019-07-26 LAB — MAGNESIUM: Magnesium: 2 mg/dL (ref 1.7–2.4)

## 2019-07-26 LAB — SARS CORONAVIRUS 2 (TAT 6-24 HRS): SARS Coronavirus 2: NEGATIVE

## 2019-07-26 NOTE — Progress Notes (Addendum)
   07/26/19 1100  Clinical Encounter Type  Visited With Patient  Visit Type Initial  Referral From Nurse  Consult/Referral To Chaplain  Chaplain visited with patient in response to OR for AD. Patient was pleasant and said she is feeling much better. Patient explained to Chaplain that something burst in her stomach and she has an infection. Patient said the antibiotics is helping. Patient mentioned she will be 59 in a few months and use to work in a [employer], but never completed an AD. Chaplain asked if patient had questions about AD and she said no. Patient said she will include AD information with her will. Patient mentioned that she can't eat, but was given ice chips earlier and there was a hole in her cup and the ice melted all over her table. Chaplain offered to get her a cup of ice. After asking nurse if patient could have ice, Chaplain took a cup of ice and a spoon to patient. Patient was glad that Chaplain brought her the AD.

## 2019-07-26 NOTE — Evaluation (Signed)
Physical Therapy Evaluation Patient Details Name: [name] MRN: [id number] DOB: 1925/02/24 Today's Date: 07/26/2019   History of Present Illness  presented to ER from urgent care after 4-5 day history of R LQ pain; admitted for management of acute appendicitis.  Plan for conservative management with antibiotics, IVF and NPO status with continued monitoring per surgical team.  Clinical Impression  Upon evaluation, patient alert and oriented; follows commands and demonstrates good effort with mobility tasks.  Reports noted improvement in abdominal pain; hopeful to avoid surgical intervention. Bilat UE/LE strength and ROM grossly symmetrical and WFL; no focal weakness, sensory or coordination deficit noted.  Able to complete bed mobility with indep; sit/stand, basic transfers and gait (200') with SPC, close sup. Demonstrates reciprocal stepping pattern with fair cadence and overall gait speed; slightly guarded due to higher-level balance deficits, but good awareness and use of compensatory strategies as needed. Will maintain on caseload throughout remaining hospital stay to ensure continued mobility; anticipate no formal PT needs upon discharge.    Follow Up Recommendations No PT follow up    Equipment Recommendations       Recommendations for Other Services       Precautions / Restrictions Precautions Precautions: Fall Restrictions Weight Bearing Restrictions: No      Mobility  Bed Mobility Overal bed mobility: Modified Independent                Transfers Overall transfer level: Needs assistance Equipment used: Straight cane Transfers: Sit to/from Stand Sit to Stand: Supervision         General transfer comment: mild use of UE support to stabilize/balance  Ambulation/Gait Ambulation/Gait assistance: Supervision Gait Distance (Feet): 200 Feet Assistive device: Straight cane   Gait velocity: 10' walk time, 10 seconds Gait velocity interpretation: <1.31  ft/sec, indicative of household ambulator General Gait Details: reciprocal stepping pattern with fair cadence and overall gait speed; slightly guarded due to higher-level balance deficits, but good awareness and use of compensatory strategies as needed  Stairs            Wheelchair Mobility    Modified Rankin (Stroke Patients Only)       Balance Overall balance assessment: Needs assistance Sitting-balance support: No upper extremity supported;Feet supported Sitting balance-Leahy Scale: Good     Standing balance support: Single extremity supported Standing balance-Leahy Scale: Fair Standing balance comment: decreased ankle/hip strategies evident; relies on SPC and contralateral UE support for external stabilization as needed                             Pertinent Vitals/Pain Pain Assessment: Faces Faces Pain Scale: Hurts a little bit Pain Location: R LQ Pain Descriptors / Indicators: Guarding;Sore Pain Intervention(s): Limited activity within patient's tolerance;Monitored during session;Repositioned    Home Living Family/patient expects to be discharged to:: Private residence Living Arrangements: Alone Available Help at Discharge: Family;Available PRN/intermittently Type of Home: House Home Access: Stairs to enter Entrance Stairs-Rails: None Entrance Stairs-Number of Steps: 1 Home Layout: One level Home Equipment: Walker - 2 wheels;Cane - single point      Prior Function Level of Independence: Independent with assistive device(s)         Comments: Mod indep with SPC for ADLs, household and community mobilization; does have RW that she uses at time (but prefers [facility]).  Denies fall history in recent six months.     Hand Dominance        Extremity/Trunk Assessment  Upper Extremity Assessment Upper Extremity Assessment: Overall WFL for tasks assessed    Lower Extremity Assessment Lower Extremity Assessment: Overall WFL for tasks assessed        Communication   Communication: No difficulties  Cognition Arousal/Alertness: Awake/alert Behavior During Therapy: WFL for tasks assessed/performed Overall Cognitive Status: Within Functional Limits for tasks assessed                                        General Comments      Exercises     Assessment/Plan    PT Assessment Patient needs continued PT services  PT Problem List Decreased activity tolerance;Decreased balance;Decreased mobility       PT Treatment Interventions DME instruction;Gait training;Stair training;Functional mobility training;Therapeutic activities;Therapeutic exercise;Balance training;Patient/family education    PT Goals (Current goals can be found in the Care Plan section)  Acute Rehab PT Goals Patient Stated Goal: to go home PT Goal Formulation: With patient Time For Goal Achievement: 08/09/19 Potential to Achieve Goals: Good    Frequency Min 2X/week   Barriers to discharge        Co-evaluation               AM-PAC PT "6 Clicks" Mobility  Outcome Measure Help needed turning from your back to your side while in a flat bed without using bedrails?: None Help needed moving from lying on your back to sitting on the side of a flat bed without using bedrails?: None Help needed moving to and from a bed to a chair (including a wheelchair)?: None Help needed standing up from a chair using your arms (e.g., wheelchair or bedside chair)?: None Help needed to walk in hospital room?: None Help needed climbing 3-5 steps with a railing? : A Little 6 Click Score: 23    End of Session Equipment Utilized During Treatment: Gait belt Activity Tolerance: Patient tolerated treatment well Patient left: in chair;with call bell/phone within reach;with chair alarm set Nurse Communication: Mobility status PT Visit Diagnosis: Muscle weakness (generalized) (M62.81)    Time: [id number] PT Time Calculation (min) (ACUTE ONLY): 17 min   Charges:    PT Evaluation $PT Eval Moderate Complexity: 1 Mod PT Treatments $Gait Training: 8-22 mins        Maraya Gwilliam H. Owens Shark, PT, DPT, NCS 07/26/19, 11:52 AM [phone]

## 2019-07-26 NOTE — Progress Notes (Signed)
07/26/2019  Subjective: No acute events overnight.  Patient reports that she is feeling well.  Denies any worsening pain.  No nausea or vomiting.  Had two bowel movements.  WBC improved to 14  Vital signs: Temp:  [97.9 F (36.6 C)-98.4 F (36.9 C)] 98.2 F (36.8 C) (04/02 0430) Pulse Rate:  [59-106] 62 (04/02 0430) Resp:  [14-26] 18 (04/01 2034) BP: (96-160)/(55-140) 96/56 (04/02 0430) SpO2:  [78 %-100 %] 96 % (04/02 0430) Weight:  [61.1 kg-62.6 kg] 61.1 kg (04/01 2032)   Intake/Output: No intake/output data recorded. Last BM Date: 07/25/19  Physical Exam: Constitutional: No acute distress Abdomen: Soft, nondistended, with mild soreness or tenderness to palpation in the right lower quadrant.  No peritonitis.  Labs:  Recent Labs    07/25/19 1345 07/26/19 0516  WBC 15.8* 14.0*  HGB 12.5 11.3*  HCT 38.4 35.5*  PLT 278 259   Recent Labs    07/25/19 1345 07/26/19 0516  NA 134* 136  K 3.9 3.7  CL 100 101  CO2 26 26  GLUCOSE 94 84  BUN 16 14  CREATININE 0.56 0.75  CALCIUM 8.5* 8.2*   No results for input(s): LABPROT, INR in the last 72 hours.  Imaging: CT ABDOMEN PELVIS W CONTRAST  Result Date: 07/25/2019 CLINICAL DATA:  Right lower quadrant pain, appendicitis suspected EXAM: CT ABDOMEN AND PELVIS WITH CONTRAST TECHNIQUE: Multidetector CT imaging of the abdomen and pelvis was performed using the standard protocol following bolus administration of intravenous contrast. CONTRAST:  170mL OMNIPAQUE IOHEXOL 300 MG/ML  SOLN COMPARISON:  02/24/2013 FINDINGS: Lower chest: No acute abnormality. Hepatobiliary: No solid liver abnormality is seen. No gallstones, gallbladder wall thickening, or biliary dilatation. Pancreas: Unremarkable. No pancreatic ductal dilatation or surrounding inflammatory changes. Spleen: Normal in size without significant abnormality. Adrenals/Urinary Tract: Adrenal glands are unremarkable. Kidneys are normal, without renal calculi, solid lesion, or  hydronephrosis. Bladder is unremarkable. Stomach/Bowel: Stomach is within normal limits. The appendix is thickened, hyperemic, and enlarged to approximately 1.2 cm. The mucosa is indistinct near the appendiceal tip with a possible small adjacent fluid collection measuring 1.2 cm (series 2, image 67). Sigmoid diverticulosis. Vascular/Lymphatic: Aortic atherosclerosis. No enlarged abdominal or pelvic lymph nodes. Reproductive: No mass or other significant abnormality. Other: No abdominal wall hernia or abnormality. Small volume fluid in the right lower quadrant and low pelvis. Musculoskeletal: No acute or significant osseous findings. IMPRESSION: 1. The appendix is thickened, hyperemic, and enlarged approximately 1.2 cm in caliber. The mucosa is indistinct near the appendiceal tip with a possible small adjacent fluid collection measuring 1.2 cm, concerning for perforation and abscess formation. There is no overt pneumoperitoneum. 2. Small volume fluid in the right lower quadrant and low pelvis, likely reactive. 3.  Aortic Atherosclerosis (ICD10-I70.0). These results were called by telephone at the time of interpretation on 07/25/2019 at 3:17 pm to NP Honor Loh , who verbally acknowledged these results. Electronically Signed   By: Eddie Candle M.D.   On: 07/25/2019 15:20    Assessment/Plan: This is a 86 y.o. female with acute appendicitis with possible perforation.  -Patient is clinically doing well.  We will start ice chips and sips of water today and advance to clear liquids for dinner tonight. -Continue IV fluid hydration as well as IV antibiotics. -Out of bed, PT therapy consult.   Melvyn Neth, [facility]

## 2019-07-27 LAB — CBC
HCT: 33.9 % — ABNORMAL LOW (ref 36.0–46.0)
Hemoglobin: 10.8 g/dL — ABNORMAL LOW (ref 12.0–15.0)
MCH: 27.5 pg (ref 26.0–34.0)
MCHC: 31.9 g/dL (ref 30.0–36.0)
MCV: 86.3 fL (ref 80.0–100.0)
Platelets: 254 10*3/uL (ref 150–400)
RBC: 3.93 MIL/uL (ref 3.87–5.11)
RDW: 13.2 % (ref 11.5–15.5)
WBC: 9.7 10*3/uL (ref 4.0–10.5)
nRBC: 0 % (ref 0.0–0.2)

## 2019-07-27 LAB — URINE CULTURE: Culture: 50000 — AB

## 2019-07-27 MED ORDER — PANTOPRAZOLE SODIUM 40 MG PO TBEC
40.0000 mg | DELAYED_RELEASE_TABLET | Freq: Every day | ORAL | Status: DC
  Administered 2019-07-27: 40 mg via ORAL
  Filled 2019-07-27: qty 1

## 2019-07-27 MED ORDER — ENOXAPARIN SODIUM 60 MG/0.6ML ~~LOC~~ SOLN
1.0000 mg/kg | Freq: Two times a day (BID) | SUBCUTANEOUS | Status: DC
  Administered 2019-07-27: 60 mg via SUBCUTANEOUS
  Filled 2019-07-27 (×4): qty 0.6

## 2019-07-27 MED ORDER — SODIUM CHLORIDE 0.9 % IV SOLN: INTRAVENOUS | Status: DC

## 2019-07-27 MED ORDER — SODIUM CHLORIDE 0.9 % IV SOLN
INTRAVENOUS | Status: DC | PRN
  Administered 2019-07-27: 40 mL via INTRAVENOUS
  Administered 2019-07-27: 250 mL via INTRAVENOUS
  Administered 2019-07-27: 30 mL via INTRAVENOUS

## 2019-07-27 NOTE — Progress Notes (Signed)
[place] Hospital Day(s): 2.   Post op day(s):  Marland Kitchen   Interval History: Patient seen and examined, no acute events or new complaints overnight. Patient reports feeling very good.  She denies any abdominal pain.  She denies any nausea or vomiting.  She is complaining of not liking the clear liquid diet.  There is no pain radiation.  There is no alleviating or aggravating factor.   Vital signs in last 24 hours: [min-max] current  Temp:  [97.4 F (36.3 C)-98.4 F (36.9 C)] 98.4 F (36.9 C) (04/03 0407) Pulse Rate:  [64-73] 73 (04/03 0407) Resp:  [18-20] 18 (04/03 0407) BP: (117-159)/(61-63) 159/63 (04/03 0407) SpO2:  [95 %-100 %] 95 % (04/03 0407)     Height: 5\' 3"  (160 cm) Weight: 61.1 kg BMI (Calculated): 23.88   Physical Exam:  Constitutional: alert, cooperative and no distress  Respiratory: breathing non-labored at rest  Cardiovascular: regular rate and sinus rhythm  Gastrointestinal: soft, non-tender, and non-distended  Labs:  CBC Latest Ref Rng & Units 07/27/2019 07/26/2019 07/25/2019  WBC 4.0 - 10.5 K/uL 9.7 14.0(H) 15.8(H)  Hemoglobin 12.0 - 15.0 g/dL 10.8(L) 11.3(L) 12.5  Hematocrit 36.0 - 46.0 % 33.9(L) 35.5(L) 38.4  Platelets 150 - 400 K/uL 254 259 278   CMP Latest Ref Rng & Units 07/26/2019 07/25/2019 04/24/2019  Glucose 70 - 99 mg/dL 84 94 90  BUN 8 - 23 mg/dL 14 16 17   Creatinine 0.44 - 1.00 mg/dL 0.75 0.56 0.45  Sodium 135 - 145 mmol/L 136 134(L) 140  Potassium 3.5 - 5.1 mmol/L 3.7 3.9 4.3  Chloride 98 - 111 mmol/L 101 100 102  CO2 22 - 32 mmol/L 26 26 29   Calcium 8.9 - 10.3 mg/dL 8.2(L) 8.5(L) 9.1  Total Protein 6.5 - 8.1 g/dL - 7.1 -  Total Bilirubin 0.3 - 1.2 mg/dL - 0.8 -  Alkaline Phos 38 - 126 U/L - 118 -  AST 15 - 41 U/L - 18 -  ALT 0 - 44 U/L - 21 -    Imaging studies: No new pertinent imaging studies   Assessment/Plan:  86 y.o. female with acute appendicitis with abscess, complicated by pertinent comorbidities including atrial  fibrillation on anticoagulation, GERD.  Patient today looking more comfortable.  The pain continues to improve.  There is no fever.  She is not liking the clear liquids and is hoping to eat something more solid.  I will advance her diet to full liquids and assess for toleration.  We will continue with DVT prophylaxis.  I encouraged the patient to get out of bed and ambulate.  I will start Lovenox therapeutic dose for A. fib.  If she tolerates full liquids today and continue to improve clinically possible discharge in the next 24 to 48 hours.  Arnold Long, MD

## 2019-07-28 LAB — CREATININE, SERUM
Creatinine, Ser: 0.6 mg/dL (ref 0.44–1.00)
GFR calc Af Amer: >60 mL/min (ref >60)
GFR calc non Af Amer: >60 mL/min (ref >60)

## 2019-07-28 MED ORDER — AMOXICILLIN-POT CLAVULANATE 875-125 MG PO TABS: 1.0000 | ORAL_TABLET | Freq: Two times a day (BID) | ORAL | 0 refills | Status: AC

## 2019-07-28 NOTE — Discharge Instructions (Signed)
  Diet: Resume home soft heart healthy diet.   Activity: Increase activity as tolerated. Light activity and walking are encouraged. Do not drive or drink alcohol if taking narcotic pain medications.  Medications: Resume all home medications. For mild to moderate pain: acetaminophen (Tylenol) or ibuprofen (if no kidney disease). Combining Tylenol with alcohol can substantially increase your risk of causing liver disease.   Take antibiotics as prescribed.   Call office [phone]) at any time if any questions, worsening pain, fevers/chills, bleeding, drainage from incision site, or other concerns. Call this number to make appointment with Dr. Hampton Abbot withing the next week.

## 2019-07-28 NOTE — Discharge Summary (Signed)
Patient ID: [name] MRN: [id number] DOB/AGE: [birth year] 86 y.o.  Admit date: 07/25/2019 Discharge date: 07/28/2019   Discharge Diagnoses:  Active Problems:   Acute appendicitis   Procedures: None  Hospital Course: Patient with acute appendicitis with small abscess.  She was admitted for IV antibiotic therapy.  Patient with history of A. fib on anticoagulation.  She was assessed to be high risk for emergent surgery at that moment due to the risk of bleeding and because of the already abscess formation.  She responded adequately to the IV antibiotic therapy.  Today the pain is completely gone.  She has tolerated soft diet.  She is passing gas and having bowel movement.  Patient is reporting that she is getting anxious here and she wants to go home.  I think that is appropriate to go home transitioning patient to oral antibiotic therapy.  Physical Exam  Constitutional: She is oriented to person, place, and time and well-developed, well-nourished, and in no distress.  Cardiovascular: Normal rate.  Pulmonary/Chest: Effort normal.  Abdominal: Soft. Bowel sounds are normal. She exhibits no distension. There is no abdominal tenderness. There is no rebound and no guarding.  Neurological: She is alert and oriented to person, place, and time.  Skin: Skin is warm.     Consults: None  Disposition: Discharge disposition: 01-Home or Self Care       Discharge Instructions    Increase activity slowly   Complete by: As directed      Allergies as of 07/28/2019      Reactions   Fluticasone-salmeterol Anaphylaxis   Albuterol    Other reaction(s): Other (See Comments) It burns and makes her cough "too strong for me or something"   Ciprofloxacin Other (See Comments)   Other reaction(s): Unknown Does not remember   Codeine Hives   Colchicine Diarrhea   Promethazine Other (See Comments)   Unable to speak. Loss muscle control (PHENGERAN)   Valacyclovir    Other reaction(s):  Muscle Pain Could hardly move   Guaifenesin Palpitations   Sulfa Antibiotics Rash      Medication List    TAKE these medications   acetaminophen 650 MG CR tablet Commonly known as: TYLENOL Take 650 mg by mouth every 8 (eight) hours as needed for pain.   amoxicillin-clavulanate 875-125 MG tablet Commonly known as: Augmentin Take 1 tablet by mouth 2 (two) times daily for 10 days.   atorvastatin 40 MG tablet Commonly known as: LIPITOR Take 40 mg by mouth every evening.   Eliquis 5 MG Tabs tablet Generic drug: apixaban Take 5 mg by mouth 2 (two) times daily.   famotidine 20 MG tablet Commonly known as: PEPCID Take 20 mg by mouth 2 (two) times daily.   levothyroxine 112 MCG tablet Commonly known as: SYNTHROID Take 112 mcg by mouth daily before breakfast.   Omega-3 1000 MG Caps Take 1,000 mg by mouth daily.   Pazeo 0.7 % Soln Generic drug: Olopatadine HCl Apply 1 drop to eye at bedtime.   Singulair 10 MG tablet Generic drug: montelukast Take 10 mg by mouth at bedtime.   Symbicort 160-4.5 MCG/ACT inhaler Generic drug: budesonide-formoterol Inhale 2 puffs into the lungs 2 (two) times daily.   Vitamin D3 50 MCG (2000 UT) Tabs Take 2,000 Units by mouth daily.   Voltaren 1 % Gel Generic drug: diclofenac Sodium Apply 2 g topically daily as needed (pain).      Follow-up Information    Piscoya, Jacqulyn Bath, MD Follow up in 1 week(s).  Specialty: General Surgery Contact information: [address] [place] [address] [phone]          This discharge encounter was more than 30-minute most of the time counseling the patient and coordinating plan of care.

## 2019-07-28 NOTE — Progress Notes (Signed)
[name] to be D/C'd Home per MD order.  Discussed prescriptions and follow up appointments with the patient. Prescriptions given to patient, medication list explained in detail. Pt verbalized understanding.  Allergies as of 07/28/2019      Reactions   Fluticasone-salmeterol Anaphylaxis   Albuterol    Other reaction(s): Other (See Comments) It burns and makes her cough "too strong for me or something"   Ciprofloxacin Other (See Comments)   Other reaction(s): Unknown Does not remember   Codeine Hives   Colchicine Diarrhea   Promethazine Other (See Comments)   Unable to speak. Loss muscle control (PHENGERAN)   Valacyclovir    Other reaction(s): Muscle Pain Could hardly move   Guaifenesin Palpitations   Sulfa Antibiotics Rash      Medication List    TAKE these medications   acetaminophen 650 MG CR tablet Commonly known as: TYLENOL Take 650 mg by mouth every 8 (eight) hours as needed for pain.   amoxicillin-clavulanate 875-125 MG tablet Commonly known as: Augmentin Take 1 tablet by mouth 2 (two) times daily for 10 days.   atorvastatin 40 MG tablet Commonly known as: LIPITOR Take 40 mg by mouth every evening.   Eliquis 5 MG Tabs tablet Generic drug: apixaban Take 5 mg by mouth 2 (two) times daily.   famotidine 20 MG tablet Commonly known as: PEPCID Take 20 mg by mouth 2 (two) times daily.   levothyroxine 112 MCG tablet Commonly known as: SYNTHROID Take 112 mcg by mouth daily before breakfast.   Omega-3 1000 MG Caps Take 1,000 mg by mouth daily.   Pazeo 0.7 % Soln Generic drug: Olopatadine HCl Apply 1 drop to eye at bedtime.   Singulair 10 MG tablet Generic drug: montelukast Take 10 mg by mouth at bedtime.   Symbicort 160-4.5 MCG/ACT inhaler Generic drug: budesonide-formoterol Inhale 2 puffs into the lungs 2 (two) times daily.   Vitamin D3 50 MCG (2000 UT) Tabs Take 2,000 Units by mouth daily.   Voltaren 1 % Gel Generic drug: diclofenac  Sodium Apply 2 g topically daily as needed (pain).       Vitals:   07/27/19 2003 07/28/19 0547  BP: (!) 144/98 (!) 150/78  Pulse: (!) 59 74  Resp: 20 20  Temp: 98.9 F (37.2 C) 97.6 F (36.4 C)  SpO2: 98% 96%    Skin clean, dry and intact without evidence of skin break down, no evidence of skin tears noted. IV catheter discontinued intact. Site without signs and symptoms of complications. Dressing and pressure applied. Pt denies pain at this time. No complaints noted.  An After Visit Summary was printed and given to the patient. Patient escorted via [place], and D/C home via private auto.  Marry Guan 07/28/2019 10:43 AM

## 2019-08-09 ENCOUNTER — Ambulatory Visit (INDEPENDENT_AMBULATORY_CARE_PROVIDER_SITE_OTHER): Payer: Medicare Other | Admitting: Surgery

## 2019-08-09 ENCOUNTER — Encounter: Payer: Self-pay | Admitting: Surgery

## 2019-08-09 ENCOUNTER — Other Ambulatory Visit: Payer: Self-pay

## 2019-08-09 VITALS — BP 160/79 | HR 92 | Temp 97.7°F | Resp 15 | Ht 63.0 in | Wt 138.2 lb

## 2019-08-09 DIAGNOSIS — K3532 Acute appendicitis with perforation and localized peritonitis, without abscess: Secondary | ICD-10-CM | POA: Diagnosis not present

## 2019-08-09 NOTE — Progress Notes (Signed)
08/09/2019  History of Present Illness: [name] is a 86 y.o. female presenting for follow up after hospitalization for perforated appendicitis.  She was admitted from 07/25/19 to 07/28/19.  Given that she was on Eliquis, we opted for conservative management with antibiotics and bowel rest.  She was discharged in good condition, on a course of oral antibiotic.  Today, she reports that she finished her antibiotic yesterday and she feels great, "like a brand new woman".  Denies any nausea, vomiting, abdominal pain, constipation or diarrhea.  Of note, patient mentions that she still drives and runs her errands, but she does not get on the highway, so to get her, a relative drove.  Past Medical History: Past Medical History:  Diagnosis Date  . Arthritis   . Asthma   . Atrial fibrillation ([place])   . Cataract   . Eczema   . GERD (gastroesophageal reflux disease)   . Gout of wrist   . Heart murmur   . Pacemaker   . Pseudogout of ankle or foot   . Thyroid disease   . TIA (transient ischemic attack)      Past Surgical History: Past Surgical History:  Procedure Laterality Date  . ABLATION    . EYE SURGERY Bilateral    cataract  . foot sugery    . HIP ARTHROPLASTY     right  . IMPLANTABLE CARDIOVERTER DEFIBRILLATOR (ICD) GENERATOR CHANGE Left 05/29/2019   Procedure: PACEMAKER CHANGE OUT;  Surgeon: Isaias Cowman, MD;  Location: [facility] ORS;  Service: Cardiovascular;  Laterality: Left;  . PACEMAKER INSERTION      Home Medications: Prior to Admission medications   Medication Sig Start Date End Date Taking? Authorizing Provider  acetaminophen (TYLENOL) 650 MG CR tablet Take 650 mg by mouth every 8 (eight) hours as needed for pain.   Yes [provider]  apixaban (ELIQUIS) 5 MG TABS tablet Take 5 mg by mouth 2 (two) times daily.  06/26/13  Yes [provider]  atorvastatin (LIPITOR) 40 MG tablet Take 40 mg by mouth every evening.    Yes [provider]   budesonide-formoterol (SYMBICORT) 160-4.5 MCG/ACT inhaler Inhale 2 puffs into the lungs 2 (two) times daily.  09/09/13  Yes [provider]  Cholecalciferol (VITAMIN D3) 50 MCG (2000 UT) TABS Take 2,000 Units by mouth daily.    Yes [provider]  diclofenac Sodium (VOLTAREN) 1 % GEL Apply 2 g topically daily as needed (pain).  08/22/13  Yes [provider]  famotidine (PEPCID) 20 MG tablet Take 20 mg by mouth 2 (two) times daily.    Yes [provider]  levothyroxine (SYNTHROID, LEVOTHROID) 112 MCG tablet Take 112 mcg by mouth daily before breakfast.   Yes [provider]  montelukast (SINGULAIR) 10 MG tablet Take 10 mg by mouth at bedtime.  12/27/13  Yes [provider]  Olopatadine HCl (PAZEO) 0.7 % SOLN Apply 1 drop to eye at bedtime.    Yes [provider]  Omega-3 1000 MG CAPS Take 1,000 mg by mouth daily.    Yes [provider]    Allergies: Allergies  Allergen Reactions  . Fluticasone-Salmeterol Anaphylaxis  . Albuterol     Other reaction(s): Other (See Comments) It burns and makes her cough "too strong for me or something"  . Ciprofloxacin Other (See Comments)    Other reaction(s): Unknown Does not remember  . Codeine Hives  . Colchicine Diarrhea  . Promethazine Other (See Comments)    Unable  to speak. Loss muscle control (PHENGERAN)  . Valacyclovir     Other reaction(s): Muscle Pain Could hardly move  . Guaifenesin Palpitations  . Sulfa Antibiotics Rash    Review of Systems: Review of Systems  Constitutional: Negative for chills and fever.  Respiratory: Negative for shortness of breath.   Cardiovascular: Negative for chest pain.  Gastrointestinal: Negative for abdominal pain, constipation, diarrhea, nausea and vomiting.    Physical Exam BP (!) 160/79   Pulse 92   Temp 97.7 F (36.5 C)   Resp 15   Ht 5\' 3"  (1.6 m)   Wt 138 lb 3.2 oz (62.7 kg)   SpO2 95%   BMI 24.48 kg/m  CONSTITUTIONAL:  No acute distress HEENT:  Normocephalic, atraumatic, extraocular motion intact. RESPIRATORY:  Lungs are clear, and breath sounds are equal bilaterally. Normal respiratory effort without pathologic use of accessory muscles. CARDIOVASCULAR: Heart is regular without murmurs, gallops, or rubs. GI: The abdomen is soft, non-distended, non-tender to palpation.  NEUROLOGIC:  Motor and sensation is grossly normal.  Cranial nerves are grossly intact. PSYCH:  Alert and oriented to person, place and time. Affect is normal.  Labs/Imaging: None recently  Assessment and Plan: This is a 86 y.o. female with recent admission for perforated appendicitis.  --The patient has done very well with conservative management and has fully recovered in a short time. --Discussed with her the options for watchful waiting without surgical intervention vs workup and scheduling for surgery.  With watchful waiting, she would be monitoring her symptoms and if there are any concerns, she would let us know right away so we could see her and examine her.  Without surgery, there is a chance that this episode could happen again, but we are not able to predict when or if.  The other route would be to do a colonoscopy in about 8 weeks and then scheduling her for laparoscopic appendectomy.  However, given her age, I'm wondering if two anesthesia events would be too much for her.  She is very functional however.  After discussing these options with her, she would rather proceed with watchful waiting.  She feels that she's still recovering from her pacemaker and her right hip issues, and she would not like to tackle on another issue with more surgery and procedures.  I think this is very reasonable and fully support her decision. --However, if she were to change her mind, or if she starts noticing some symptoms again, she understands that she can call us anytime and we'd be happy to see her. --Follow up prn.  Face-to-face time spent with the  patient and care providers was 15 minutes, with more than 50% of the time spent counseling, educating, and coordinating care of the patient.     Melvyn Neth, [facility]

## 2019-08-09 NOTE — Patient Instructions (Addendum)
Report to the ER right away if you start to experience any symptoms.  Call us if you would like to consider surgery in the future.                                                                      Follow-up with our office as needed. Please call and ask to speak with a nurse if you develop questions or concerns.   Appendicitis, Adult  The appendix is a tube in the body that is shaped like a finger. It is attached to the large intestine. Appendicitis means that this tube is swollen (inflamed). If this is not treated, the tube can tear (rupture). This can lead to a life-threatening infection. This condition can also cause pus to build up in the appendix (abscess). What are the causes? This condition may be caused by something that blocks the appendix. These include:  A ball of poop (stool).  Lymph glands that are bigger than normal. Sometimes the cause is not known. What increases the risk? You are more likely to develop this condition if you are between 49 and 21 years of age. What are the signs or symptoms? Symptoms of this condition include:  Pain around the belly button (navel). ? The pain moves toward the lower right belly (abdomen). ? The pain can get worse with time. ? The pain can get worse if you cough. ? The pain can get worse if you move suddenly.  Tenderness in the lower right belly.  Feeling sick to your stomach (nauseous).  Throwing up (vomiting).  Not feeling hungry (loss of appetite).  A fever.  Having trouble pooping (constipation).  Watery poop (diarrhea).  Not feeling well. How is this treated?  Most often, this condition is treated by taking out the appendix (appendectomy).  Summary  Appendicitis is swelling of the appendix. The appendix is a tube that is shaped like a finger. It is joined to the large intestine.  This condition may be caused by something that blocks the appendix. This can lead to an infection.  This condition is usually  treated by taking out the appendix. This information is not intended to replace advice given to you by your health care provider. Make sure you discuss any questions you have with your health care provider. Document Revised: 09/27/2017 Document Reviewed: 09/27/2017 Elsevier Patient Education  [place].

## 2019-11-01 ENCOUNTER — Other Ambulatory Visit: Payer: Self-pay

## 2019-11-01 ENCOUNTER — Emergency Department
Admission: EM | Admit: 2019-11-01 | Discharge: 2019-11-01 | Disposition: A | Discharge status: Home / Self Care | Payer: Medicare Other | Attending: Emergency Medicine | Admitting: Emergency Medicine

## 2019-11-01 DIAGNOSIS — Z950 Presence of cardiac pacemaker: Secondary | ICD-10-CM | POA: Diagnosis not present

## 2019-11-01 DIAGNOSIS — J45909 Unspecified asthma, uncomplicated: Secondary | ICD-10-CM | POA: Diagnosis not present

## 2019-11-01 DIAGNOSIS — Z7901 Long term (current) use of anticoagulants: Secondary | ICD-10-CM | POA: Insufficient documentation

## 2019-11-01 DIAGNOSIS — Z0000 Encounter for general adult medical examination without abnormal findings: Secondary | ICD-10-CM

## 2019-11-01 DIAGNOSIS — T5891XA Toxic effect of carbon monoxide from unspecified source, accidental (unintentional), initial encounter: Secondary | ICD-10-CM | POA: Diagnosis present

## 2019-11-01 LAB — CARBOXYHEMOGLOBIN - COOX: Carboxyhemoglobin: 0 % — ABNORMAL LOW (ref 0.5–1.5)

## 2019-11-01 NOTE — ED Triage Notes (Signed)
Pt states that she was awakened to her carbon monoxide alarm going off, pt was able to to look at the alarm with a flash light that told her to get out of the room,, pt went to her neighbors house to get assistance. 911 was called and the fire dept also responded and determined no carbon monoxide detected and that the meter's battery was going bad Pt is in no distress, just feels like she was awakened abruptly

## 2019-11-01 NOTE — ED Provider Notes (Signed)
[facility] Emergency Department Provider Note  Time seen: 7:26 AM  I have reviewed the triage vital signs and the nursing notes.   HISTORY  Chief Complaint Carbon monoxide possible exposure  HPI [name] is a 86 y.o. female with a past medical history of arthritis, atrial fibrillation on Eliquis, gastric reflux, presents to the emergency department after a possible carbon monoxide exposure.  According to the patient she woke to her carbon monoxide detector beeping.  Per EMS fire department checked out the house and found no signs of carbon monoxide but states that the battery on her detector was low which is what was causing the detector to beep.  Patient still wished to be evaluated so EMS brought her for evaluation.  Here in the patient's states she feels nervous but denies any other symptoms.  Specifically denies any shortness of breath or trouble breathing.  No headache no fatigue no chest pain.  Negative review of systems.  Past Medical History:  Diagnosis Date  . Arthritis   . Asthma   . Atrial fibrillation ([place])   . Cataract   . Eczema   . GERD (gastroesophageal reflux disease)   . Gout of wrist   . Heart murmur   . Pacemaker   . Pseudogout of ankle or foot   . Thyroid disease   . TIA (transient ischemic attack)     Patient Active Problem List   Diagnosis Date Noted  . Acute appendicitis 07/25/2019  . Leukocytosis 06/29/2016  . Hypogammaglobulinemia ([place]) 06/29/2016  . Temporary cerebral vascular dysfunction 02/28/2014  . DD (diverticular disease) 02/28/2014  . Allergic rhinitis 02/28/2014  . Allergy status to sulfonamides 02/28/2014  . History of anticoagulant therapy 02/28/2014  . Arthritis 02/28/2014  . Airway hyperreactivity 02/28/2014  . A-fib ([place]) 02/28/2014  . Bilateral cataracts 02/28/2014  . Dermatitis, eczematoid 02/28/2014  . Allergy to environmental factors 02/28/2014  . Acid reflux 02/28/2014  . Polypharmacy  02/28/2014  . HLD (hyperlipidemia) 02/28/2014  . Adult hypothyroidism 02/28/2014  . BP (high blood pressure) 02/28/2014  . Arthritis, degenerative 02/28/2014  . Degenerative arthritis of hip 10/09/2013  . 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 Neck rigid 05/30/2013  . 1st degree AV block 10/10/2012  . History of biliary T-tube placement 06/14/2012  . History of cardioversion 06/05/2012  . Avascular necrosis of femoral head ([place]) 06/01/2012  . Lumbar radiculopathy 06/01/2012  . DDD (degenerative disc disease), lumbar 06/01/2012  . H/O cardiac catheterization 10/20/2009    Past Surgical History:  Procedure Laterality Date  . ABLATION    . EYE SURGERY Bilateral    cataract  . foot sugery    . HIP ARTHROPLASTY     right  . IMPLANTABLE CARDIOVERTER DEFIBRILLATOR (ICD) GENERATOR CHANGE Left 05/29/2019   Procedure: PACEMAKER CHANGE OUT;  Surgeon: Isaias Cowman, MD;  Location: [facility] ORS;  Service: Cardiovascular;  Laterality: Left;  . PACEMAKER INSERTION      Prior to Admission medications   Medication Sig Start Date End Date Taking? Authorizing Provider  acetaminophen (TYLENOL) 650 MG CR tablet Take 650 mg by mouth every 8 (eight) hours as needed for pain.    [provider]  apixaban (ELIQUIS) 5 MG TABS tablet Take 5 mg by mouth 2 (two) times daily.  06/26/13   [provider]  atorvastatin (LIPITOR) 40 MG tablet Take 40 mg by mouth every evening.     [provider]  budesonide-formoterol (SYMBICORT) 160-4.5 MCG/ACT inhaler Inhale 2  puffs into the lungs 2 (two) times daily.  09/09/13   [provider]  Cholecalciferol (VITAMIN D3) 50 MCG (2000 UT) TABS Take 2,000 Units by mouth daily.     [provider]  diclofenac Sodium (VOLTAREN) 1 % GEL Apply 2 g topically daily as needed (pain).  08/22/13   [provider]  famotidine (PEPCID) 20 MG tablet Take 20 mg by mouth 2 (two) times daily.     [provider]  levothyroxine (SYNTHROID, LEVOTHROID) 112 MCG tablet Take 112 mcg by mouth daily before breakfast.    [provider]  montelukast (SINGULAIR) 10 MG tablet Take 10 mg by mouth at bedtime.  12/27/13   [provider]  Olopatadine HCl (PAZEO) 0.7 % SOLN Apply 1 drop to eye at bedtime.     [provider]  Omega-3 1000 MG CAPS Take 1,000 mg by mouth daily.     [provider]    Allergies  Allergen Reactions  . Fluticasone-Salmeterol Anaphylaxis  . Albuterol     Other reaction(s): Other (See Comments) It burns and makes her cough "too strong for me or something"  . Ciprofloxacin Other (See Comments)    Other reaction(s): Unknown Does not remember  . Codeine Hives  . Colchicine Diarrhea  . Promethazine Other (See Comments)    Unable to speak. Loss muscle control (PHENGERAN)  . Valacyclovir     Other reaction(s): Muscle Pain Could hardly move  . Guaifenesin Palpitations  . Sulfa Antibiotics Rash    Family History  Problem Relation Age of Onset  . Leukemia Father   . Lung cancer Sister   . Breast cancer Neg Hx     Social History Social History   Tobacco Use  . Smoking status: Never Smoker  . Smokeless tobacco: Never Used  Vaping Use  . Vaping Use: Never used  Substance Use Topics  . Alcohol use: Not Currently    Alcohol/week: 0.0 standard drinks  . Drug use: No    Review of Systems Constitutional: Negative for fever. Cardiovascular: Negative for chest pain. Respiratory: Negative for shortness of breath. Gastrointestinal: Negative for abdominal pain Musculoskeletal: Negative for musculoskeletal complaints Neurological: Negative for headache All other ROS negative  ____________________________________________   PHYSICAL EXAM:  Constitutional: Alert and oriented. Well appearing and in no distress. Eyes: Normal exam ENT      Head: Normocephalic and atraumatic.      Mouth/Throat: Mucous membranes are  moist. Cardiovascular: Normal rate, regular rhythm. No murmur Respiratory: Normal respiratory effort without tachypnea nor retractions. Breath sounds are clear Gastrointestinal: Soft and nontender. No distention.   Musculoskeletal: Nontender with normal range of motion in all extremities.  Neurologic:  Normal speech and language. No gross focal neurologic deficits  Skin:  Skin is warm, dry and intact.  Psychiatric: Mood and affect are normal.    INITIAL IMPRESSION / ASSESSMENT AND PLAN / ED COURSE  Pertinent labs & imaging results that were available during my care of the patient were reviewed by me and considered in my medical decision making (see chart for details).   Patient presents to the emergency department after possible carbon monoxide exposure.  EMS states fire department checked the house and did not find any evidence of carbon monoxide.  States the detector was intermittently beeping due to low battery.  Patient does admit to feeling anxious this morning and wanted to be checked out just in case.  Here the patient appears well, no distress.  Negative review of  systems including no shortness of breath chest pain headache or fatigue.  We will check a carboxyhemoglobin level/VBG as a precaution.  Otherwise patient appears well and would be appropriate for discharge home.  Carboxyhemoglobin of 0.  Patient will be discharged home.  Patient agreeable.  Witney Huie was evaluated in Emergency Department on 11/01/2019 for the symptoms described in the history of present illness. She was evaluated in the context of the global COVID-19 pandemic, which necessitated consideration that the patient might be at risk for infection with the SARS-CoV-2 virus that causes COVID-19. Institutional protocols and algorithms that pertain to the evaluation of patients at risk for COVID-19 are in a state of rapid change based on information released by regulatory bodies including the CDC and federal and  state organizations. These policies and algorithms were followed during the patient's care in the ED.  ____________________________________________   FINAL CLINICAL IMPRESSION(S) / ED DIAGNOSES  Possible carbon monoxide exposure   Harvest Dark, MD 11/01/19 0840

## 2019-11-20 LAB — BLOOD GAS, VENOUS
Acid-Base Excess: 5.3 mmol/L — ABNORMAL HIGH (ref 0.0–2.0)
Bicarbonate: 30.4 mmol/L — ABNORMAL HIGH (ref 20.0–28.0)
O2 Saturation: 13.4 %
Patient temperature: 37
pCO2, Ven: 48 mmHg (ref 44.0–60.0)
pH, Ven: 7.41 (ref 7.250–7.430)

## 2019-12-10 ENCOUNTER — Ambulatory Visit
Admission: EM | Admit: 2019-12-10 | Discharge: 2019-12-10 | Disposition: A | Discharge status: Home / Self Care | Payer: Medicare Other | Attending: Family Medicine | Admitting: Family Medicine

## 2019-12-10 ENCOUNTER — Other Ambulatory Visit: Payer: Self-pay

## 2019-12-10 ENCOUNTER — Encounter: Payer: Self-pay | Admitting: Emergency Medicine

## 2019-12-10 DIAGNOSIS — R0789 Other chest pain: Secondary | ICD-10-CM | POA: Diagnosis present

## 2019-12-10 LAB — CBC WITH DIFFERENTIAL/PLATELET
Abs Immature Granulocytes: 0.07 10*3/uL (ref 0.00–0.07)
Basophils Absolute: 0.1 10*3/uL (ref 0.0–0.1)
Basophils Relative: 1 %
Eosinophils Absolute: 0.1 10*3/uL (ref 0.0–0.5)
Eosinophils Relative: 1 %
HCT: 37.5 % (ref 36.0–46.0)
Hemoglobin: 12.1 g/dL (ref 12.0–15.0)
Immature Granulocytes: 1 %
Lymphocytes Relative: 12 %
Lymphs Abs: 1.3 10*3/uL (ref 0.7–4.0)
MCH: 27.3 pg (ref 26.0–34.0)
MCHC: 32.3 g/dL (ref 30.0–36.0)
MCV: 84.5 fL (ref 80.0–100.0)
Monocytes Absolute: 1.1 10*3/uL — ABNORMAL HIGH (ref 0.1–1.0)
Monocytes Relative: 11 %
Neutro Abs: 7.9 10*3/uL — ABNORMAL HIGH (ref 1.7–7.7)
Neutrophils Relative %: 74 %
Platelets: 245 10*3/uL (ref 150–400)
RBC: 4.44 MIL/uL (ref 3.87–5.11)
RDW: 14.2 % (ref 11.5–15.5)
WBC: 10.6 10*3/uL — ABNORMAL HIGH (ref 4.0–10.5)
nRBC: 0 % (ref 0.0–0.2)

## 2019-12-10 LAB — COMPREHENSIVE METABOLIC PANEL
ALT: 17 U/L (ref 0–44)
AST: 19 U/L (ref 15–41)
Albumin: 3.5 g/dL (ref 3.5–5.0)
Alkaline Phosphatase: 104 U/L (ref 38–126)
Anion gap: 9 (ref 5–15)
BUN: 14 mg/dL (ref 8–23)
CO2: 23 mmol/L (ref 22–32)
Calcium: 8.6 mg/dL — ABNORMAL LOW (ref 8.9–10.3)
Chloride: 105 mmol/L (ref 98–111)
Creatinine, Ser: 0.54 mg/dL (ref 0.44–1.00)
GFR calc Af Amer: >60 mL/min (ref >60)
GFR calc non Af Amer: >60 mL/min (ref >60)
Glucose, Bld: 108 mg/dL — ABNORMAL HIGH (ref 70–99)
Potassium: 3.8 mmol/L (ref 3.5–5.1)
Sodium: 137 mmol/L (ref 135–145)
Total Bilirubin: 0.7 mg/dL (ref 0.3–1.2)
Total Protein: 7.3 g/dL (ref 6.5–8.1)

## 2019-12-10 LAB — CKMB (ARMC ONLY): CK, MB: 1.4 ng/mL (ref 0.5–5.0)

## 2019-12-10 LAB — TROPONIN I (HIGH SENSITIVITY): Troponin I (High Sensitivity): 5 ng/L (ref <18)

## 2019-12-10 NOTE — ED Provider Notes (Signed)
[facility]    CSN: [id number] Arrival date & time: 12/10/19  0843      History   Chief Complaint Chief Complaint  Patient presents with  . Chest Pain   HPI  86 year old female presents with the above complaint.  Patient reports that last night she had some left-sided chest pain.  She states that at 3 AM she woke up and felt like she was in atrial fibrillation.  Patient reports that this has now resolved but the pain still persist.  She reports left-sided chest pain.  No shortness of breath.  No nausea or diaphoresis.  Patient also reports that she has some pain in her neck.  Pain is particular worse when she takes a deep breath.  No relieving factors.  Patient states that she helped a lady with some housecleaning yesterday.  She is concerned that this may be playing a role.    Past Medical History:  Diagnosis Date  . Arthritis   . Asthma   . Atrial fibrillation ([place])   . Cataract   . Eczema   . GERD (gastroesophageal reflux disease)   . Gout of wrist   . Heart murmur   . Pacemaker   . Pseudogout of ankle or foot   . Thyroid disease   . TIA (transient ischemic attack)     Patient Active Problem List   Diagnosis Date Noted  . Acute appendicitis 07/25/2019  . Leukocytosis 06/29/2016  . Hypogammaglobulinemia ([place]) 06/29/2016  . Temporary cerebral vascular dysfunction 02/28/2014  . DD (diverticular disease) 02/28/2014  . Allergic rhinitis 02/28/2014  . Allergy status to sulfonamides 02/28/2014  . History of anticoagulant therapy 02/28/2014  . Arthritis 02/28/2014  . Airway hyperreactivity 02/28/2014  . A-fib ([place]) 02/28/2014  . Bilateral cataracts 02/28/2014  . Dermatitis, eczematoid 02/28/2014  . Allergy to environmental factors 02/28/2014  . Acid reflux 02/28/2014  . Polypharmacy 02/28/2014  . HLD (hyperlipidemia) 02/28/2014  . Adult hypothyroidism 02/28/2014  . BP (high blood pressure) 02/28/2014  . Arthritis, degenerative 02/28/2014  .  Degenerative arthritis of hip 10/09/2013  . 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 Neck rigid 05/30/2013  . 1st degree AV block 10/10/2012  . History of biliary T-tube placement 06/14/2012  . History of cardioversion 06/05/2012  . Avascular necrosis of femoral head ([place]) 06/01/2012  . Lumbar radiculopathy 06/01/2012  . DDD (degenerative disc disease), lumbar 06/01/2012  . H/O cardiac catheterization 10/20/2009    Past Surgical History:  Procedure Laterality Date  . ABLATION    . EYE SURGERY Bilateral    cataract  . foot sugery    . HIP ARTHROPLASTY     right  . IMPLANTABLE CARDIOVERTER DEFIBRILLATOR (ICD) GENERATOR CHANGE Left 05/29/2019   Procedure: PACEMAKER CHANGE OUT;  Surgeon: Isaias Cowman, MD;  Location: [facility] ORS;  Service: Cardiovascular;  Laterality: Left;  . PACEMAKER INSERTION      OB History   No obstetric history on file.      Home Medications    Prior to Admission medications   Medication Sig Start Date End Date Taking? Authorizing Provider  acetaminophen (TYLENOL) 650 MG CR tablet Take 650 mg by mouth every 8 (eight) hours as needed for pain.   Yes [provider]  apixaban (ELIQUIS) 5 MG TABS tablet Take 5 mg by mouth 2 (two) times daily.  06/26/13  Yes [provider]  atorvastatin (LIPITOR) 40 MG tablet Take 40 mg by mouth every evening.    Yes [provider]  budesonide-formoterol (SYMBICORT) 160-4.5 MCG/ACT inhaler Inhale 2 puffs into the lungs 2 (two) times daily.  09/09/13  Yes [provider]  Cholecalciferol (VITAMIN D3) 50 MCG (2000 UT) TABS Take 2,000 Units by mouth daily.    Yes [provider]  diclofenac Sodium (VOLTAREN) 1 % GEL Apply 2 g topically daily as needed (pain).  08/22/13  Yes [provider]  famotidine (PEPCID) 20 MG tablet Take 20 mg by mouth 2 (two) times daily.    Yes [provider]  levothyroxine (SYNTHROID, LEVOTHROID) 112 MCG tablet Take  112 mcg by mouth daily before breakfast.   Yes [provider]  montelukast (SINGULAIR) 10 MG tablet Take 10 mg by mouth at bedtime.  12/27/13  Yes [provider]  Olopatadine HCl (PAZEO) 0.7 % SOLN Apply 1 drop to eye at bedtime.    Yes [provider]  Omega-3 1000 MG CAPS Take 1,000 mg by mouth daily.    Yes [provider]    Family History Family History  Problem Relation Age of Onset  . Leukemia Father   . Lung cancer Sister   . Breast cancer Neg Hx     Social History Social History   Tobacco Use  . Smoking status: Never Smoker  . Smokeless tobacco: Never Used  Vaping Use  . Vaping Use: Never used  Substance Use Topics  . Alcohol use: Not Currently    Alcohol/week: 0.0 standard drinks  . Drug use: No     Allergies   Fluticasone-salmeterol, Albuterol, Ciprofloxacin, Codeine, Colchicine, Promethazine, Valacyclovir, Guaifenesin, and Sulfa antibiotics   Review of Systems Review of Systems  Constitutional: Negative.   Cardiovascular: Positive for chest pain and palpitations.   Physical Exam Triage Vital Signs ED Triage Vitals  Enc Vitals Group     BP 12/10/19 0852 132/61     Pulse Rate 12/10/19 0852 70     Resp 12/10/19 0852 18     Temp 12/10/19 0852 98.3 F (36.8 C)     Temp Source 12/10/19 0852 Oral     SpO2 12/10/19 0852 98 %     Weight 12/10/19 0855 138 lb (62.6 kg)     Height 12/10/19 0855 5\' 3"  (1.6 m)     Head Circumference --      Peak Flow --      Pain Score 12/10/19 0851 0     Pain Loc --      Pain Edu? --      Excl. in [place]? --    Updated Vital Signs BP 132/61 (BP Location: Right Arm)   Pulse 70   Temp 98.3 F (36.8 C) (Oral)   Resp 18   Ht 5\' 3"  (1.6 m)   Wt 62.6 kg   SpO2 98%   BMI 24.45 kg/m   Visual Acuity Right Eye Distance:   Left Eye Distance:   Bilateral Distance:    Right Eye Near:   Left Eye Near:    Bilateral Near:     Physical Exam Vitals and nursing note reviewed.    Constitutional:      General: She is not in acute distress.    Appearance: Normal appearance. She is not ill-appearing.  HENT:     Head: Normocephalic and atraumatic.  Eyes:     General:        Right eye: No discharge.        Left eye: No discharge.     Conjunctiva/sclera: Conjunctivae normal.  Cardiovascular:  Rate and Rhythm: Normal rate and regular rhythm.  Pulmonary:     Effort: Pulmonary effort is normal.     Breath sounds: Normal breath sounds. No wheezing or rhonchi.  Abdominal:     General: There is no distension.     Palpations: Abdomen is soft.     Tenderness: There is no abdominal tenderness.  Neurological:     Mental Status: She is alert.  Psychiatric:        Behavior: Behavior normal.     Comments: Anxious.    UC Treatments / Results  Labs (all labs ordered are listed, but only abnormal results are displayed) Labs Reviewed  CBC WITH DIFFERENTIAL/PLATELET - Abnormal; Notable for the following components:      Result Value   WBC 10.6 (*)    Neutro Abs 7.9 (*)    Monocytes Absolute 1.1 (*)    All other components within normal limits  COMPREHENSIVE METABOLIC PANEL - Abnormal; Notable for the following components:   Glucose, Bld 108 (*)    Calcium 8.6 (*)    All other components within normal limits  CKMB([facility] ONLY)  TROPONIN I (HIGH SENSITIVITY)    EKG Interpretation: Paced rhythm, rate 79.  Lead V1: [place] pattern noted.  Radiology No results found.  Procedures Procedures (including critical care time)  Medications Ordered in UC Medications - No data to display  Initial Impression / Assessment and Plan / UC Course  I have reviewed the triage vital signs and the nursing notes.  Pertinent labs & imaging results that were available during my care of the patient were reviewed by me and considered in my medical decision making (see chart for details).    86 year old female presents with chest pain.  Atypical and likely musculoskeletal.   CK-MB and troponin normal.  Slightly elevated white count at 10.6.  Labs otherwise unremarkable.  EKG interpretation limited due to paced rhythm.  No evidence of cardiac ischemia at this time.  Patient is well-appearing.  Advised rest and close follow-up with her cardiologist.  Final Clinical Impressions(s) / UC Diagnoses   Final diagnoses:  Other chest pain     Discharge Instructions     Labs are reassuring!  Rest.  Follow up with Dr. Nehemiah Massed.  Take care  Dr. Lacinda Axon     ED Prescriptions    None     PDMP not reviewed this encounter.   Thersa Salt [place], [place] 12/10/19 [phone]

## 2019-12-10 NOTE — Discharge Instructions (Signed)
Labs are reassuring!  Rest.  Follow up with Dr. Nehemiah Massed.  Take care  Dr. Lacinda Axon

## 2019-12-10 NOTE — ED Triage Notes (Signed)
Patient states she is not having CP now and doesn't feel like she is in atrial fib now. Patient states the pain is worse with deep breaths.

## 2019-12-10 NOTE — ED Triage Notes (Addendum)
Patient in today c/o chest pain since 3 am this morning. Patient thinks she is in atrial fib. Patient has a pacemaker. Patient denies nausea. Patient does have some neck pain, but denies any other areas of pain. Patient denies sob.

## 2019-12-18 DIAGNOSIS — D6869 Other thrombophilia: Secondary | ICD-10-CM | POA: Insufficient documentation

## 2019-12-25 ENCOUNTER — Other Ambulatory Visit: Payer: Medicare Other

## 2020-01-01 ENCOUNTER — Ambulatory Visit
Admission: RE | Admit: 2020-01-01 | Discharge: 2020-01-01 | Disposition: A | Discharge status: Home / Self Care | Payer: Medicare Other | Source: Ambulatory Visit | Attending: Internal Medicine | Admitting: Internal Medicine

## 2020-01-01 ENCOUNTER — Other Ambulatory Visit: Payer: Self-pay

## 2020-01-01 DIAGNOSIS — R1310 Dysphagia, unspecified: Secondary | ICD-10-CM | POA: Diagnosis not present

## 2020-01-23 IMAGING — CT CT HEAD W/O CM
1 series · 16 of 28 positions shown, 20 images · non-contrast
Comparison: Head CT scan 04/29/2013.

CLINICAL DATA: The patient suffered a blow to the right side of the
head due to a fall 3-4 days ago. Initial encounter.

EXAM:
CT HEAD WITHOUT CONTRAST
TECHNIQUE: Contiguous axial images were obtained from the base of the skull
through the vertex without intravenous contrast.

[Series 2: head wo · axial · 0.44mm/px · z∈[-222,-96]mm · 16 of 28 slices shown, 20 images]
[im 2/28  brain]
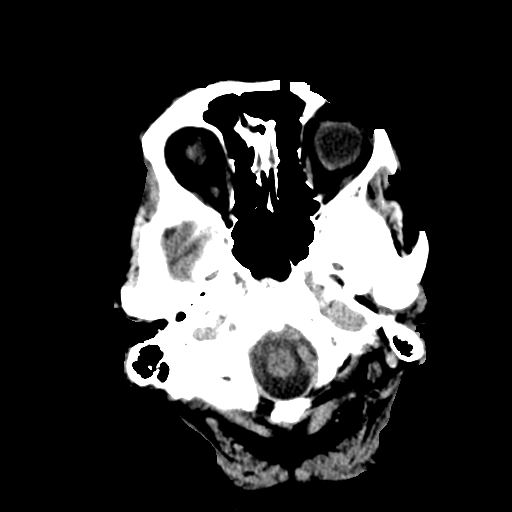
[im 2/28  bone]
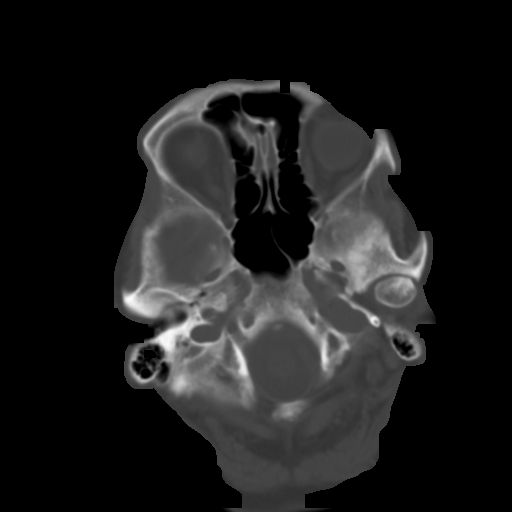
[im 4/28  brain]
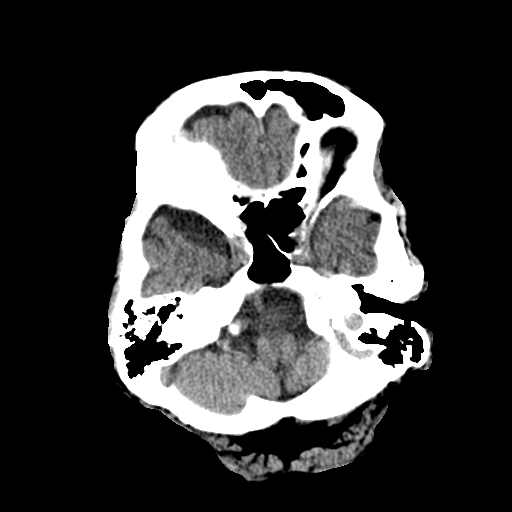
[im 6/28  brain]
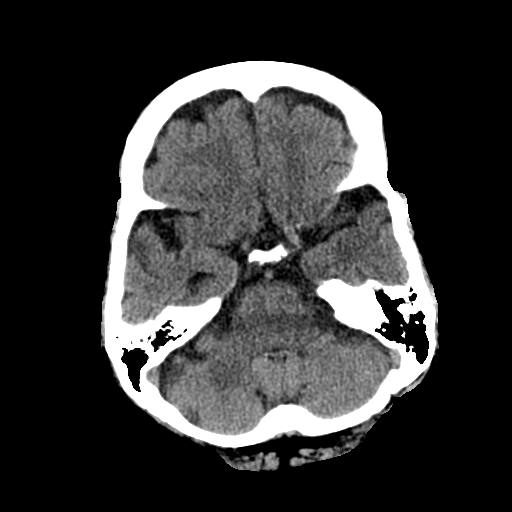
[im 7/28  brain]
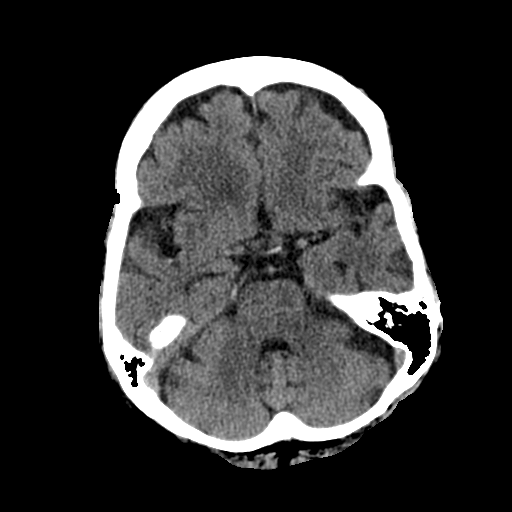
[im 9/28  brain]
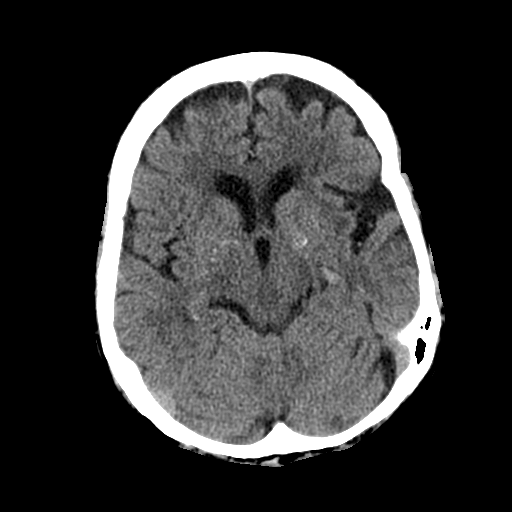
[im 9/28  bone]
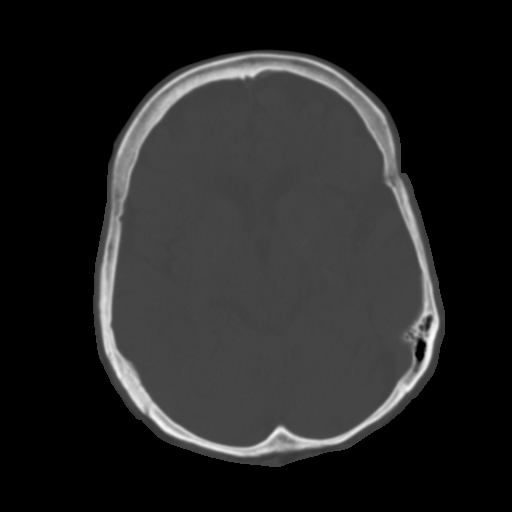
[im 10/28  brain]
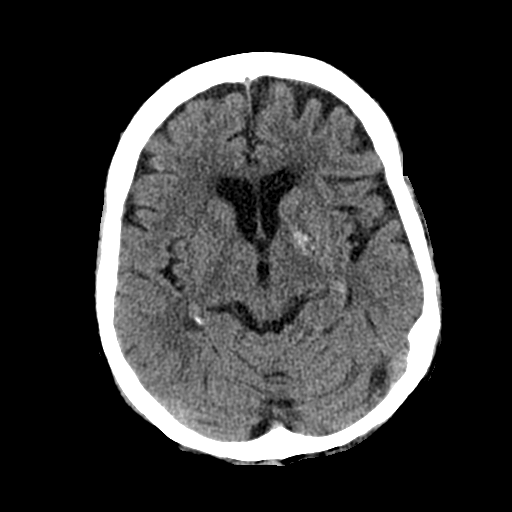
[im 12/28  brain]
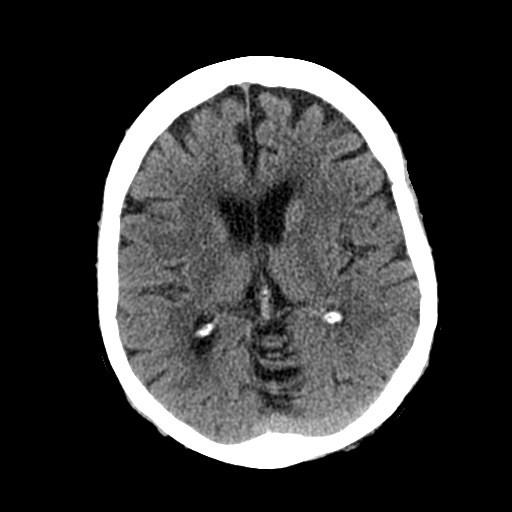
[im 14/28  brain]
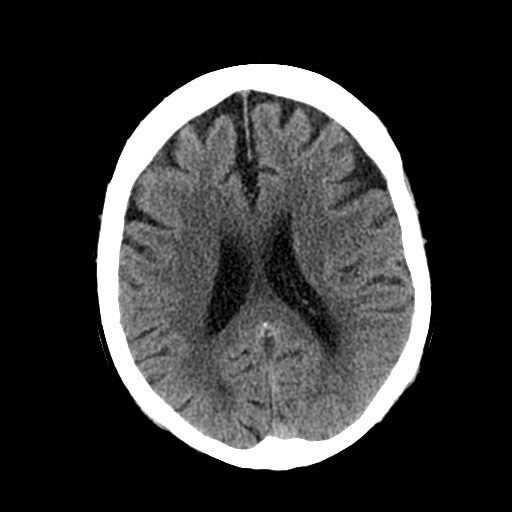
[im 15/28  brain]
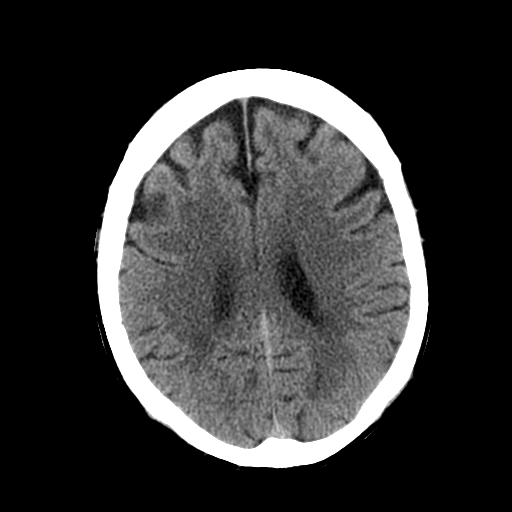
[im 15/28  bone]
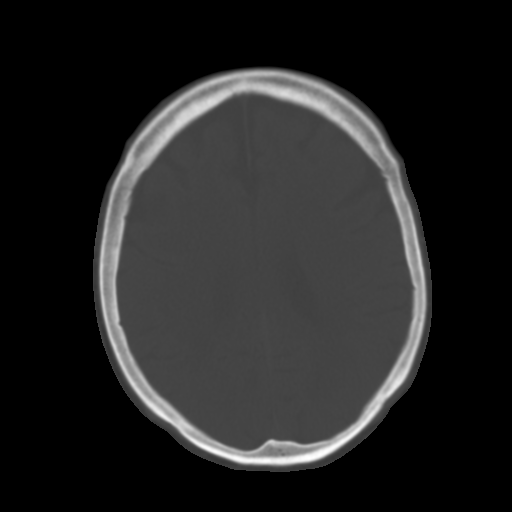
[im 17/28  brain]
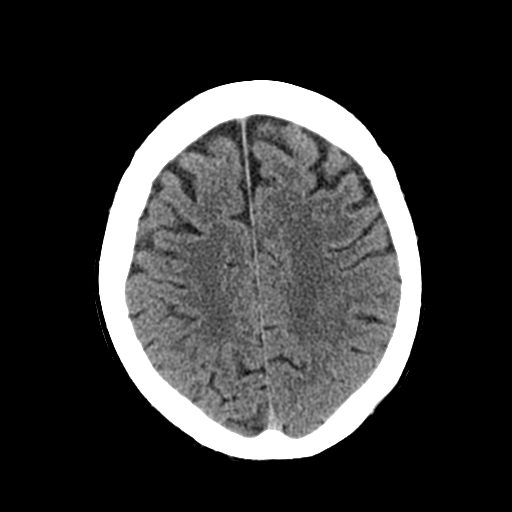
[im 19/28  brain]
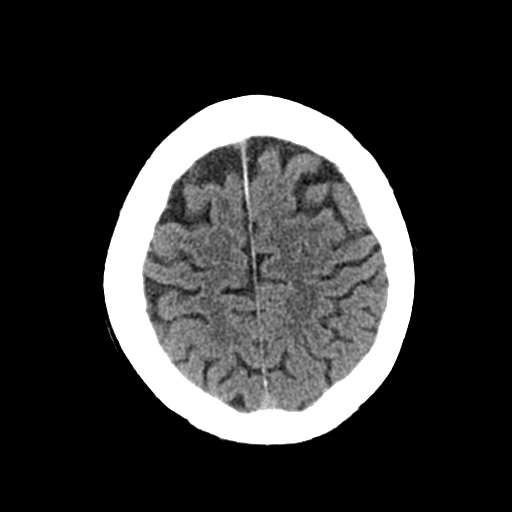
[im 20/28  brain]
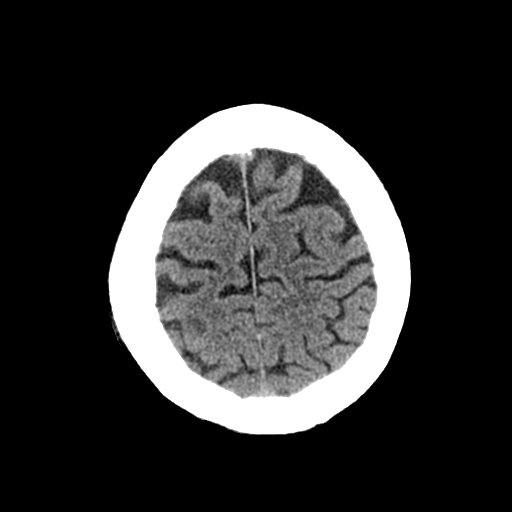
[im 22/28  brain]
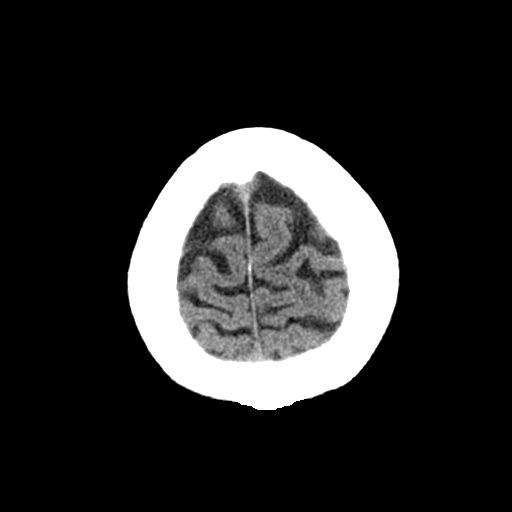
[im 22/28  bone]
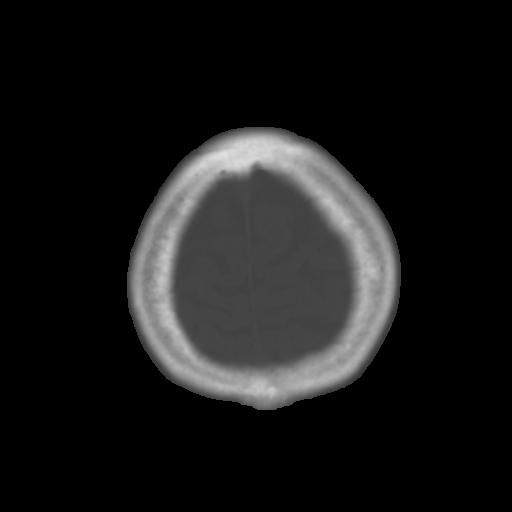
[im 23/28  brain]
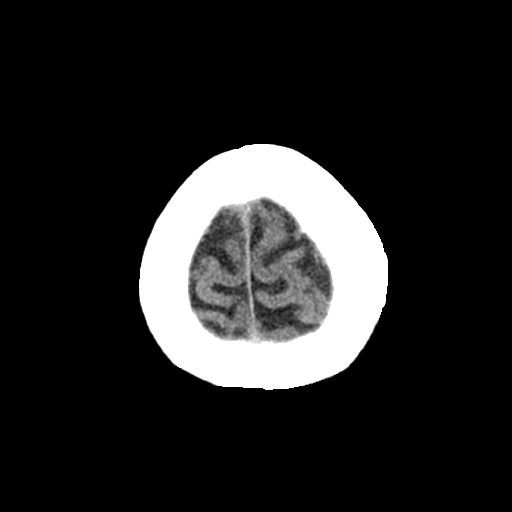
[im 25/28  brain]
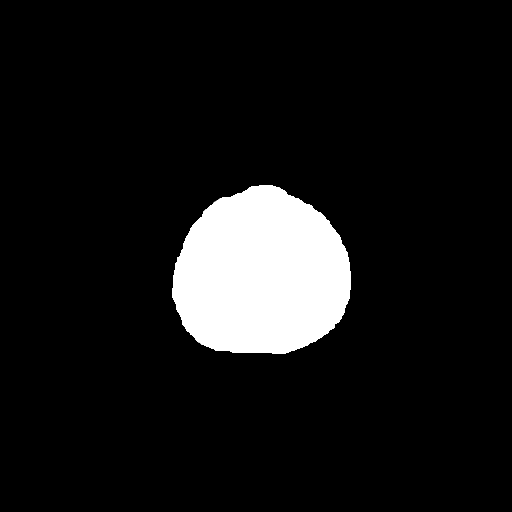
[im 27/28  brain]
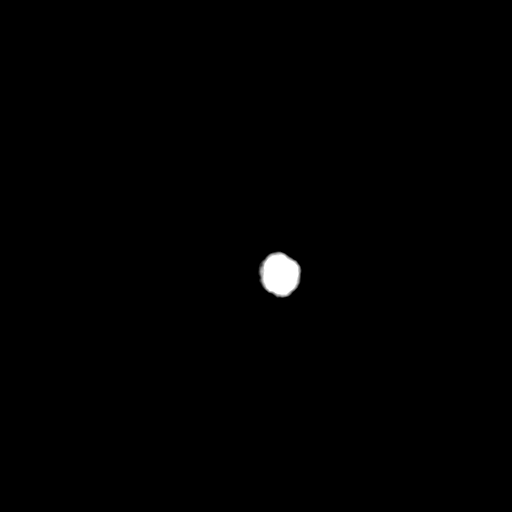

[16 of 28 positions shown; findings below may reference images not displayed]

FINDINGS: Brain: No evidence of acute infarction, hemorrhage, hydrocephalus,
extra-axial collection or mass lesion/mass effect. Mild atrophy and
chronic microvascular ischemic change are stable in appearance.

Vascular: No hyperdense vessel or unexpected calcification.

Skull: Intact.  No focal lesion.

Sinuses/Orbits: Negative.

Other: None.
IMPRESSION: No acute abnormality.

Stable appearance of mild atrophy and chronic microvascular ischemic
change.

## 2020-07-07 ENCOUNTER — Ambulatory Visit
Admission: EM | Admit: 2020-07-07 | Discharge: 2020-07-07 | Disposition: A | Discharge status: Home / Self Care | Payer: Medicare Other | Attending: Emergency Medicine | Admitting: Emergency Medicine

## 2020-07-07 ENCOUNTER — Other Ambulatory Visit: Payer: Self-pay

## 2020-07-07 ENCOUNTER — Encounter: Payer: Self-pay | Admitting: Emergency Medicine

## 2020-07-07 DIAGNOSIS — L244 Irritant contact dermatitis due to drugs in contact with skin: Secondary | ICD-10-CM

## 2020-07-07 DIAGNOSIS — B353 Tinea pedis: Secondary | ICD-10-CM

## 2020-07-07 DIAGNOSIS — L309 Dermatitis, unspecified: Secondary | ICD-10-CM | POA: Diagnosis not present

## 2020-07-07 MED ORDER — PREDNISONE 50 MG PO TABS
50.0000 mg | ORAL_TABLET | Freq: Every day | ORAL | 0 refills | Status: DC
Start: 2020-07-07 — End: 2020-07-14

## 2020-07-07 MED ORDER — CLOTRIMAZOLE 1 % EX CREA
1.0000 | TOPICAL_CREAM | Freq: Two times a day (BID) | CUTANEOUS | 3 refills | Status: DC
Start: 2020-07-07 — End: 2021-04-26

## 2020-07-07 MED ORDER — EUCERIN EX CREA: TOPICAL_CREAM | CUTANEOUS | 0 refills | Status: DC | PRN

## 2020-07-07 NOTE — ED Triage Notes (Signed)
Pt is here with a rash under both eyes and also both feet. Pt states that the rash is not painful just irritates. Pt states that she noticed the rash yesterday.

## 2020-07-07 NOTE — ED Provider Notes (Signed)
[facility]    CSN: [id number] Arrival date & time: 07/07/20  0945      History   Chief Complaint Chief Complaint  Patient presents with  . Rash    HPI [name] is a 87 y.o. female.   HPI patient is a 87 year old female who presented to the emergency department for 3 separate skin lesions.  Patient states that she has been dealing with eczema "all my life."  She has an area to the left foot that has worsened with the use of a topical that she has from her primary care for "neuropathy."  Patient states that this has flared one of her areas of eczema.  She is also concerned that she may have an infection to the right toes.  She has had changes to her toenails as well as some erythema and "peeling skin" to right toes.  Patient states that she recently received a new topical prescription for her peripheral neuropathy and she believes that she may have accidentally rubbed her face and has caused some erythema and itching around bilateral eyes.  No visual changes.  No purulent drainage.  Patient denies any other complaints at this time.  She has a history of arthritis, asthma, eczema, gout, TIA hypothyroidism.  No complaints of chronic medical issues.  Past Medical History:  Diagnosis Date  . Arthritis   . Asthma   . Atrial fibrillation ([place])   . Cataract   . Eczema   . GERD (gastroesophageal reflux disease)   . Gout of wrist   . Heart murmur   . Pacemaker   . Pseudogout of ankle or foot   . Thyroid disease   . TIA (transient ischemic attack)     Patient Active Problem List   Diagnosis Date Noted  . Acute appendicitis 07/25/2019  . Leukocytosis 06/29/2016  . Hypogammaglobulinemia ([place]) 06/29/2016  . Temporary cerebral vascular dysfunction 02/28/2014  . DD (diverticular disease) 02/28/2014  . Allergic rhinitis 02/28/2014  . Allergy status to sulfonamides 02/28/2014  . History of anticoagulant therapy 02/28/2014  . Arthritis 02/28/2014  . Airway  hyperreactivity 02/28/2014  . A-fib ([place]) 02/28/2014  . Bilateral cataracts 02/28/2014  . Dermatitis, eczematoid 02/28/2014  . Allergy to environmental factors 02/28/2014  . Acid reflux 02/28/2014  . Polypharmacy 02/28/2014  . HLD (hyperlipidemia) 02/28/2014  . Adult hypothyroidism 02/28/2014  . BP (high blood pressure) 02/28/2014  . Arthritis, degenerative 02/28/2014  . Degenerative arthritis of hip 10/09/2013  . 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 Neck rigid 05/30/2013  . 1st degree AV block 10/10/2012  . History of biliary T-tube placement 06/14/2012  . History of cardioversion 06/05/2012  . Avascular necrosis of femoral head ([place]) 06/01/2012  . Lumbar radiculopathy 06/01/2012  . DDD (degenerative disc disease), lumbar 06/01/2012  . H/O cardiac catheterization 10/20/2009    Past Surgical History:  Procedure Laterality Date  . ABLATION    . EYE SURGERY Bilateral    cataract  . foot sugery    . HIP ARTHROPLASTY     right  . IMPLANTABLE CARDIOVERTER DEFIBRILLATOR (ICD) GENERATOR CHANGE Left 05/29/2019   Procedure: PACEMAKER CHANGE OUT;  Surgeon: Isaias Cowman, MD;  Location: [facility] ORS;  Service: Cardiovascular;  Laterality: Left;  . PACEMAKER INSERTION      OB History   No obstetric history on file.      Home Medications    Prior to Admission medications   Medication Sig Start Date End Date Taking? Authorizing Provider  clotrimazole (LOTRIMIN) 1 % cream Apply 1 application topically 2 (two) times daily. Apply to toes of R foot 07/07/20  Yes Tymeka Privette, Charline Bills, PA-C  predniSONE (DELTASONE) 50 MG tablet Take 1 tablet (50 mg total) by mouth daily with breakfast. 07/07/20  Yes Peytin Dechert, Charline Bills, PA-C  Skin Protectants, Misc. (EUCERIN) cream Apply topically as needed for dry skin. Apply to tops of feet 07/07/20  Yes Falynn Ailey, Charline Bills, PA-C  acetaminophen (TYLENOL) 650 MG CR tablet Take 650 mg by mouth every 8 (eight) hours as needed  for pain.    [provider]  apixaban (ELIQUIS) 5 MG TABS tablet Take 5 mg by mouth 2 (two) times daily.  06/26/13   [provider]  atorvastatin (LIPITOR) 40 MG tablet Take 40 mg by mouth every evening.     [provider]  budesonide-formoterol (SYMBICORT) 160-4.5 MCG/ACT inhaler Inhale 2 puffs into the lungs 2 (two) times daily.  09/09/13   [provider]  Cholecalciferol (VITAMIN D3) 50 MCG (2000 UT) TABS Take 2,000 Units by mouth daily.     [provider]  diclofenac Sodium (VOLTAREN) 1 % GEL Apply 2 g topically daily as needed (pain).  08/22/13   [provider]  famotidine (PEPCID) 20 MG tablet Take 20 mg by mouth 2 (two) times daily.     [provider]  levothyroxine (SYNTHROID, LEVOTHROID) 112 MCG tablet Take 112 mcg by mouth daily before breakfast.    [provider]  montelukast (SINGULAIR) 10 MG tablet Take 10 mg by mouth at bedtime.  12/27/13   [provider]  Olopatadine HCl (PAZEO) 0.7 % SOLN Apply 1 drop to eye at bedtime.     [provider]  Omega-3 1000 MG CAPS Take 1,000 mg by mouth daily.     [provider]    Family History Family History  Problem Relation Age of Onset  . Leukemia Father   . Lung cancer Sister   . Breast cancer Neg Hx     Social History Social History   Tobacco Use  . Smoking status: Never Smoker  . Smokeless tobacco: Never Used  Vaping Use  . Vaping Use: Never used  Substance Use Topics  . Alcohol use: Not Currently    Alcohol/week: 0.0 standard drinks  . Drug use: No     Allergies   Fluticasone-salmeterol, Albuterol, Ciprofloxacin, Codeine, Colchicine, Promethazine, Valacyclovir, Guaifenesin, and Sulfa antibiotics   Review of Systems Review of Systems  Constitutional: Negative for fever.  HENT: Negative.   Eyes: Positive for itching.  Respiratory: Negative for shortness of breath.   Cardiovascular: Negative for chest pain.   Gastrointestinal: Negative.   Musculoskeletal: Negative.   Skin: Positive for color change and rash.  Allergic/Immunologic: Negative.   Neurological: Negative.      Physical Exam Triage Vital Signs ED Triage Vitals  Enc Vitals Group     BP 07/07/20 1014 131/85     Pulse Rate 07/07/20 1014 66     Resp 07/07/20 1014 16     Temp 07/07/20 1014 97.8 F (36.6 C)     Temp Source 07/07/20 1014 Oral     SpO2 07/07/20 1014 97 %     Weight --      Height --      Head Circumference --      Peak Flow --      Pain Score 07/07/20 1011 0     Pain Loc --      Pain  Edu? --      Excl. in [place]? --    No data found.  Updated Vital Signs BP 131/85 (BP Location: Right Arm)   Pulse 66   Temp 97.8 F (36.6 C) (Oral)   Resp 16   SpO2 97%   Visual Acuity Right Eye Distance:   Left Eye Distance:   Bilateral Distance:    Right Eye Near:   Left Eye Near:    Bilateral Near:     Physical Exam Constitutional:      Appearance: Normal appearance.  HENT:     Mouth/Throat:     Mouth: Mucous membranes are moist.     Pharynx: Oropharynx is clear.  Eyes:     General:        Right eye: No discharge.        Left eye: No discharge.     Extraocular Movements: Extraocular movements intact.     Conjunctiva/sclera: Conjunctivae normal.     Pupils: Pupils are equal, round, and reactive to light.  Musculoskeletal:        General: Normal range of motion.  Skin:    General: Skin is warm and dry.     Findings: Rash present.     Comments: Patient with erythematous hive-like rash under both eyes.  Appears consistent with contact dermatitis.  No vesicle formation.  No cellulitic changes.  Examination of the right foot reveals findings consistent with tinea pedis about the first 4 toes.  Some onychomycosis is identified as well.  No open wounds or lesions.  Sensation intact.  Capillary refill intact.  Examination of the left foot has dry eczematous type lesion to the dorsal foot.  There are some  excoriations from scratching.  No evidence of cellulitis.  No fluctuance or induration concerning for abscess.  Sensation and capillary refill intact all digits.  Neurological:     Mental Status: She is alert.      UC Treatments / Results  Labs (all labs ordered are listed, but only abnormal results are displayed) Labs Reviewed - No data to display  EKG   Radiology No results found.  Procedures Procedures (including critical care time)  Medications Ordered in UC Medications - No data to display  Initial Impression / Assessment and Plan / UC Course  I have reviewed the triage vital signs and the nursing notes.  Pertinent labs & imaging results that were available during my care of the patient were reviewed by me and considered in my medical decision making (see chart for details).     Patient presented to the emergency department complaining of 3 different skin findings.  Patient has been using a topical prescribed by her primary care for burning and pruritus secondary to peripheral neuropathy.  Patient states that she was opening up a new container and she believes that some of this ointment splashed onto her face causing some itching and burning sensation.  She has been rubbing at both eyes.  There is no evidence of periorbital cellulitis.  Patient does have findings of contact dermatitis about both eyes.  No purulent drainage or conjunctival changes.  Visualization of the right foot found findings consistent with tinea pedis about the toes.  No open wounds or lesions.  No evidence of cellulitis.  Left foot reveals findings consistent with eczema which patient has a history of.  Patient will be prescribed topical antifungals and topical Eucerin for her eczema and tinea pedis.  Patient will have a short course of prednisone for her  allergic dermatitis.  Return precautions discussed with the patient for any worsening symptoms specifically about the eyes with worsening pain, erythema,  edema, vision changes or purulent drainage.  Patient verbalizes understanding of same.   Final Clinical Impressions(s) / UC Diagnoses   Final diagnoses:  Eczema, unspecified type  Tinea pedis of right foot  Irritant contact dermatitis due to drug in contact with skin   Discharge Instructions   None    ED Prescriptions    Medication Sig Dispense Auth. Provider   predniSONE (DELTASONE) 50 MG tablet Take 1 tablet (50 mg total) by mouth daily with breakfast. 5 tablet Drystan Reader, Charline Bills, PA-C   Skin Protectants, Misc. (EUCERIN) cream Apply topically as needed for dry skin. Apply to tops of feet 454 g Aziel Morgan, Charline Bills, PA-C   clotrimazole (LOTRIMIN) 1 % cream Apply 1 application topically 2 (two) times daily. Apply to toes of R foot 30 g Allsion Nogales, Charline Bills, PA-C     PDMP not reviewed this encounter.   Darletta Moll, PA-C 07/07/20 1115

## 2020-07-14 ENCOUNTER — Encounter: Payer: Self-pay | Admitting: Emergency Medicine

## 2020-07-14 ENCOUNTER — Other Ambulatory Visit: Payer: Self-pay

## 2020-07-14 ENCOUNTER — Ambulatory Visit
Admission: EM | Admit: 2020-07-14 | Discharge: 2020-07-14 | Disposition: A | Discharge status: Home / Self Care | Payer: Medicare Other | Attending: Emergency Medicine | Admitting: Emergency Medicine

## 2020-07-14 DIAGNOSIS — L309 Dermatitis, unspecified: Secondary | ICD-10-CM

## 2020-07-14 MED ORDER — TRIAMCINOLONE ACETONIDE 0.1 % EX CREA
1.0000 | TOPICAL_CREAM | Freq: Two times a day (BID) | CUTANEOUS | 0 refills | Status: DC
Start: 2020-07-14 — End: 2021-04-26

## 2020-07-14 NOTE — Discharge Instructions (Addendum)
Apply the triamcinolone ointment to the areas of redness twice daily for a week.  If you do not have any improvement in 48 to 72 hours I recommend that she see dermatology for further evaluation.

## 2020-07-14 NOTE — ED Provider Notes (Signed)
[facility]    CSN: [id number] Arrival date & time: 07/14/20  1117      History   Chief Complaint Chief Complaint  Patient presents with  . Rash    HPI [name] is a 87 y.o. female.   HPI   87 year old female here for evaluation of rash.  Patient reports that she has struggled with eczema all her life and she was seen 7 days ago for an eczema flare on her face and was prescribed oral prednisone.  Patient was at this helped but she did not take her last pill on Sunday and then yesterday she noticed a new rash develop on her left chest, shoulder, neck, and back.  This morning she noticed that she had some of the redness on her right neck and shoulder as well.  Patient states that the areas are itchy but they do not burn or hurt.  Patient denies any difficulty with swallowing or trouble breathing.  Past Medical History:  Diagnosis Date  . Arthritis   . Asthma   . Atrial fibrillation ([place])   . Cataract   . Eczema   . GERD (gastroesophageal reflux disease)   . Gout of wrist   . Heart murmur   . Pacemaker   . Pseudogout of ankle or foot   . Thyroid disease   . TIA (transient ischemic attack)     Patient Active Problem List   Diagnosis Date Noted  . Acute appendicitis 07/25/2019  . Leukocytosis 06/29/2016  . Hypogammaglobulinemia ([place]) 06/29/2016  . Temporary cerebral vascular dysfunction 02/28/2014  . DD (diverticular disease) 02/28/2014  . Allergic rhinitis 02/28/2014  . Allergy status to sulfonamides 02/28/2014  . History of anticoagulant therapy 02/28/2014  . Arthritis 02/28/2014  . Airway hyperreactivity 02/28/2014  . A-fib ([place]) 02/28/2014  . Bilateral cataracts 02/28/2014  . Dermatitis, eczematoid 02/28/2014  . Allergy to environmental factors 02/28/2014  . Acid reflux 02/28/2014  . Polypharmacy 02/28/2014  . HLD (hyperlipidemia) 02/28/2014  . Adult hypothyroidism 02/28/2014  . BP (high blood pressure) 02/28/2014  . Arthritis,  degenerative 02/28/2014  . Degenerative arthritis of hip 10/09/2013  . 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 Neck rigid 05/30/2013  . 1st degree AV block 10/10/2012  . History of biliary T-tube placement 06/14/2012  . History of cardioversion 06/05/2012  . Avascular necrosis of femoral head ([place]) 06/01/2012  . Lumbar radiculopathy 06/01/2012  . DDD (degenerative disc disease), lumbar 06/01/2012  . H/O cardiac catheterization 10/20/2009    Past Surgical History:  Procedure Laterality Date  . ABLATION    . EYE SURGERY Bilateral    cataract  . foot sugery    . HIP ARTHROPLASTY     right  . IMPLANTABLE CARDIOVERTER DEFIBRILLATOR (ICD) GENERATOR CHANGE Left 05/29/2019   Procedure: PACEMAKER CHANGE OUT;  Surgeon: Isaias Cowman, MD;  Location: [facility] ORS;  Service: Cardiovascular;  Laterality: Left;  . PACEMAKER INSERTION      OB History   No obstetric history on file.      Home Medications    Prior to Admission medications   Medication Sig Start Date End Date Taking? Authorizing Provider  triamcinolone (KENALOG) 0.1 % Apply 1 application topically 2 (two) times daily. 07/14/20  Yes Margarette Canada, NP  acetaminophen (TYLENOL) 650 MG CR tablet Take 650 mg by mouth every 8 (eight) hours as needed for pain.    [provider]  apixaban (ELIQUIS) 5 MG TABS tablet Take 5 mg by mouth 2 (  two) times daily.  06/26/13   [provider]  atorvastatin (LIPITOR) 40 MG tablet Take 40 mg by mouth every evening.     [provider]  budesonide-formoterol (SYMBICORT) 160-4.5 MCG/ACT inhaler Inhale 2 puffs into the lungs 2 (two) times daily.  09/09/13   [provider]  Cholecalciferol (VITAMIN D3) 50 MCG (2000 UT) TABS Take 2,000 Units by mouth daily.     [provider]  clotrimazole (LOTRIMIN) 1 % cream Apply 1 application topically 2 (two) times daily. Apply to toes of R foot 07/07/20   Cuthriell, Charline Bills, PA-C  diclofenac  Sodium (VOLTAREN) 1 % GEL Apply 2 g topically daily as needed (pain).  08/22/13   [provider]  famotidine (PEPCID) 20 MG tablet Take 20 mg by mouth 2 (two) times daily.     [provider]  levothyroxine (SYNTHROID, LEVOTHROID) 112 MCG tablet Take 112 mcg by mouth daily before breakfast.    [provider]  montelukast (SINGULAIR) 10 MG tablet Take 10 mg by mouth at bedtime.  12/27/13   [provider]  Olopatadine HCl (PAZEO) 0.7 % SOLN Apply 1 drop to eye at bedtime.     [provider]  Omega-3 1000 MG CAPS Take 1,000 mg by mouth daily.     [provider]  Skin Protectants, Misc. (EUCERIN) cream Apply topically as needed for dry skin. Apply to tops of feet 07/07/20   Cuthriell, Charline Bills, PA-C    Family History Family History  Problem Relation Age of Onset  . Leukemia Father   . Lung cancer Sister   . Breast cancer Neg Hx     Social History Social History   Tobacco Use  . Smoking status: Never Smoker  . Smokeless tobacco: Never Used  Vaping Use  . Vaping Use: Never used  Substance Use Topics  . Alcohol use: Not Currently    Alcohol/week: 0.0 standard drinks  . Drug use: No     Allergies   Fluticasone-salmeterol, Albuterol, Ciprofloxacin, Codeine, Colchicine, Promethazine, Valacyclovir, Guaifenesin, and Sulfa antibiotics   Review of Systems Review of Systems  Constitutional: Negative for fever.  Respiratory: Negative for shortness of breath and stridor.   Skin: Positive for rash.  Hematological: Negative.   Psychiatric/Behavioral: Negative.      Physical Exam Triage Vital Signs ED Triage Vitals  Enc Vitals Group     BP 07/14/20 1159 128/73     Pulse Rate 07/14/20 1159 71     Resp 07/14/20 1159 18     Temp 07/14/20 1159 98.1 F (36.7 C)     Temp Source 07/14/20 1159 Oral     SpO2 07/14/20 1159 100 %     Weight 07/14/20 1157 138 lb 0.1 oz (62.6 kg)     Height 07/14/20 1157 5\' 3"  (1.6 m)     Head  Circumference --      Peak Flow --      Pain Score 07/14/20 1157 0     Pain Loc --      Pain Edu? --      Excl. in [place]? --    No data found.  Updated Vital Signs BP 128/73 (BP Location: Left Arm)   Pulse 71   Temp 98.1 F (36.7 C) (Oral)   Resp 18   Ht 5\' 3"  (1.6 m)   Wt 138 lb 0.1 oz (62.6 kg)   SpO2 100%   BMI 24.45 kg/m   Visual Acuity Right Eye Distance:  Left Eye Distance:   Bilateral Distance:    Right Eye Near:   Left Eye Near:    Bilateral Near:     Physical Exam Vitals and nursing note reviewed.  Constitutional:      General: She is not in acute distress.    Appearance: Normal appearance. She is normal weight. She is not ill-appearing.  HENT:     Head: Normocephalic and atraumatic.  Cardiovascular:     Rate and Rhythm: Normal rate and regular rhythm.     Pulses: Normal pulses.     Heart sounds: Normal heart sounds. No murmur heard. No gallop.   Pulmonary:     Effort: Pulmonary effort is normal.     Breath sounds: Normal breath sounds. No stridor. No wheezing, rhonchi or rales.  Skin:    General: Skin is warm and dry.     Capillary Refill: Capillary refill takes less than 2 seconds.     Findings: Erythema and rash present.  Neurological:     General: No focal deficit present.     Mental Status: She is alert and oriented to person, place, and time.  Psychiatric:        Mood and Affect: Mood normal.        Behavior: Behavior normal.        Thought Content: Thought content normal.        Judgment: Judgment normal.      UC Treatments / Results  Labs (all labs ordered are listed, but only abnormal results are displayed) Labs Reviewed - No data to display  EKG   Radiology No results found.  Procedures Procedures (including critical care time)  Medications Ordered in UC Medications - No data to display  Initial Impression / Assessment and Plan / UC Course  I have reviewed the triage vital signs and the nursing notes.  Pertinent labs &  imaging results that were available during my care of the patient were reviewed by me and considered in my medical decision making (see chart for details).    Patient is a very pleasant 87 year old female who is here for evaluation of a red scaly flat rash that occurred on the left side of her neck, chest, shoulder, and upper back yesterday.  Today she noticed that she had some similar lesions on her right neck and chest.  Patient has no tachypnea or dyspnea.  No stridor appreciated when auscultating over the trachea.  Lungs clear auscultation all fields.  Patient has a history of eczema and this looks like an eczema rash.  We will treat patient with topical triamcinolone twice daily.  Patient vies that if her symptoms do not improve after 2 to 3 days of the topical steroid use that she needs to see dermatology.   Final Clinical Impressions(s) / UC Diagnoses   Final diagnoses:  Eczema, unspecified type     Discharge Instructions     Apply the triamcinolone ointment to the areas of redness twice daily for a week.  If you do not have any improvement in 48 to 72 hours I recommend that she see dermatology for further evaluation.    ED Prescriptions    Medication Sig Dispense Auth. Provider   triamcinolone (KENALOG) 0.1 % Apply 1 application topically 2 (two) times daily. 30 g Margarette Canada, NP     PDMP not reviewed this encounter.   Margarette Canada, NP 07/14/20 [phone]

## 2020-07-14 NOTE — ED Triage Notes (Signed)
Patient states she was seen for a rash on 03/15. She was prescribed Prednisone. She states she did not take her last pill on Sunday and still has her rash.

## 2020-09-15 ENCOUNTER — Emergency Department: Payer: Medicare Other

## 2020-09-15 ENCOUNTER — Inpatient Hospital Stay
Admission: EM | Admit: 2020-09-15 | Discharge: 2020-09-19 | DRG: 482 | Disposition: A | Discharge status: Rehabilitation Facility | Payer: Medicare Other | Attending: Internal Medicine | Admitting: Internal Medicine

## 2020-09-15 DIAGNOSIS — S72142A Displaced intertrochanteric fracture of left femur, initial encounter for closed fracture: Principal | ICD-10-CM | POA: Diagnosis present

## 2020-09-15 DIAGNOSIS — I480 Paroxysmal atrial fibrillation: Secondary | ICD-10-CM | POA: Diagnosis present

## 2020-09-15 DIAGNOSIS — Z7951 Long term (current) use of inhaled steroids: Secondary | ICD-10-CM

## 2020-09-15 DIAGNOSIS — D649 Anemia, unspecified: Secondary | ICD-10-CM | POA: Diagnosis present

## 2020-09-15 DIAGNOSIS — Z885 Allergy status to narcotic agent status: Secondary | ICD-10-CM

## 2020-09-15 DIAGNOSIS — Z7901 Long term (current) use of anticoagulants: Secondary | ICD-10-CM | POA: Diagnosis not present

## 2020-09-15 DIAGNOSIS — J45909 Unspecified asthma, uncomplicated: Secondary | ICD-10-CM | POA: Diagnosis present

## 2020-09-15 DIAGNOSIS — E785 Hyperlipidemia, unspecified: Secondary | ICD-10-CM | POA: Diagnosis present

## 2020-09-15 DIAGNOSIS — K5900 Constipation, unspecified: Secondary | ICD-10-CM | POA: Diagnosis not present

## 2020-09-15 DIAGNOSIS — D72829 Elevated white blood cell count, unspecified: Secondary | ICD-10-CM | POA: Diagnosis present

## 2020-09-15 DIAGNOSIS — Z801 Family history of malignant neoplasm of trachea, bronchus and lung: Secondary | ICD-10-CM | POA: Diagnosis not present

## 2020-09-15 DIAGNOSIS — Z8673 Personal history of transient ischemic attack (TIA), and cerebral infarction without residual deficits: Secondary | ICD-10-CM

## 2020-09-15 DIAGNOSIS — Z20822 Contact with and (suspected) exposure to covid-19: Secondary | ICD-10-CM | POA: Diagnosis present

## 2020-09-15 DIAGNOSIS — Y92009 Unspecified place in unspecified non-institutional (private) residence as the place of occurrence of the external cause: Secondary | ICD-10-CM

## 2020-09-15 DIAGNOSIS — Z882 Allergy status to sulfonamides status: Secondary | ICD-10-CM

## 2020-09-15 DIAGNOSIS — L309 Dermatitis, unspecified: Secondary | ICD-10-CM | POA: Diagnosis present

## 2020-09-15 DIAGNOSIS — S72145A Nondisplaced intertrochanteric fracture of left femur, initial encounter for closed fracture: Secondary | ICD-10-CM | POA: Diagnosis not present

## 2020-09-15 DIAGNOSIS — Z79890 Hormone replacement therapy (postmenopausal): Secondary | ICD-10-CM

## 2020-09-15 DIAGNOSIS — Z419 Encounter for procedure for purposes other than remedying health state, unspecified: Secondary | ICD-10-CM

## 2020-09-15 DIAGNOSIS — W010XXA Fall on same level from slipping, tripping and stumbling without subsequent striking against object, initial encounter: Secondary | ICD-10-CM | POA: Diagnosis present

## 2020-09-15 DIAGNOSIS — Z888 Allergy status to other drugs, medicaments and biological substances status: Secondary | ICD-10-CM | POA: Diagnosis not present

## 2020-09-15 DIAGNOSIS — Z96641 Presence of right artificial hip joint: Secondary | ICD-10-CM | POA: Diagnosis present

## 2020-09-15 DIAGNOSIS — W19XXXA Unspecified fall, initial encounter: Secondary | ICD-10-CM

## 2020-09-15 DIAGNOSIS — Z79899 Other long term (current) drug therapy: Secondary | ICD-10-CM

## 2020-09-15 DIAGNOSIS — Z806 Family history of leukemia: Secondary | ICD-10-CM | POA: Diagnosis not present

## 2020-09-15 DIAGNOSIS — Z950 Presence of cardiac pacemaker: Secondary | ICD-10-CM | POA: Diagnosis not present

## 2020-09-15 DIAGNOSIS — S72002A Fracture of unspecified part of neck of left femur, initial encounter for closed fracture: Secondary | ICD-10-CM | POA: Diagnosis present

## 2020-09-15 DIAGNOSIS — K219 Gastro-esophageal reflux disease without esophagitis: Secondary | ICD-10-CM | POA: Diagnosis present

## 2020-09-15 DIAGNOSIS — E039 Hypothyroidism, unspecified: Secondary | ICD-10-CM | POA: Diagnosis present

## 2020-09-15 DIAGNOSIS — Z95810 Presence of automatic (implantable) cardiac defibrillator: Secondary | ICD-10-CM | POA: Diagnosis not present

## 2020-09-15 LAB — CBC WITH DIFFERENTIAL/PLATELET
Abs Immature Granulocytes: 0.09 10*3/uL — ABNORMAL HIGH (ref 0.00–0.07)
Basophils Absolute: 0.1 10*3/uL (ref 0.0–0.1)
Basophils Relative: 1 %
Eosinophils Absolute: 0.1 10*3/uL (ref 0.0–0.5)
Eosinophils Relative: 1 %
HCT: 39.9 % (ref 36.0–46.0)
Hemoglobin: 12.9 g/dL (ref 12.0–15.0)
Immature Granulocytes: 1 %
Lymphocytes Relative: 15 %
Lymphs Abs: 2 10*3/uL (ref 0.7–4.0)
MCH: 27.6 pg (ref 26.0–34.0)
MCHC: 32.3 g/dL (ref 30.0–36.0)
MCV: 85.4 fL (ref 80.0–100.0)
Monocytes Absolute: 1.2 10*3/uL — ABNORMAL HIGH (ref 0.1–1.0)
Monocytes Relative: 9 %
Neutro Abs: 10.2 10*3/uL — ABNORMAL HIGH (ref 1.7–7.7)
Neutrophils Relative %: 73 %
Platelets: 249 10*3/uL (ref 150–400)
RBC: 4.67 MIL/uL (ref 3.87–5.11)
RDW: 14.4 % (ref 11.5–15.5)
WBC: 13.7 10*3/uL — ABNORMAL HIGH (ref 4.0–10.5)
nRBC: 0 % (ref 0.0–0.2)

## 2020-09-15 LAB — BASIC METABOLIC PANEL
Anion gap: 9 (ref 5–15)
BUN: 30 mg/dL — ABNORMAL HIGH (ref 8–23)
CO2: 26 mmol/L (ref 22–32)
Calcium: 8.5 mg/dL — ABNORMAL LOW (ref 8.9–10.3)
Chloride: 101 mmol/L (ref 98–111)
Creatinine, Ser: 0.57 mg/dL (ref 0.44–1.00)
GFR, Estimated: >60 mL/min (ref >60)
Glucose, Bld: 149 mg/dL — ABNORMAL HIGH (ref 70–99)
Potassium: 4 mmol/L (ref 3.5–5.1)
Sodium: 136 mmol/L (ref 135–145)

## 2020-09-15 LAB — RESP PANEL BY RT-PCR (FLU A&B, COVID) ARPGX2
Influenza A by PCR: NEGATIVE
Influenza B by PCR: NEGATIVE
SARS Coronavirus 2 by RT PCR: NEGATIVE

## 2020-09-15 MED ORDER — SODIUM CHLORIDE 0.9 % IV SOLN: INTRAVENOUS | Status: DC

## 2020-09-15 MED ORDER — FAMOTIDINE 20 MG PO TABS
20.0000 mg | ORAL_TABLET | Freq: Two times a day (BID) | ORAL | Status: DC
  Administered 2020-09-16 – 2020-09-19 (×6): 20 mg via ORAL
  Filled 2020-09-15 (×7): qty 1

## 2020-09-15 MED ORDER — HYDROMORPHONE HCL 1 MG/ML IJ SOLN
0.5000 mg | Freq: Once | INTRAMUSCULAR | Status: AC
  Administered 2020-09-15: 0.5 mg via INTRAVENOUS
  Filled 2020-09-15: qty 1

## 2020-09-15 MED ORDER — ACETAMINOPHEN 325 MG PO TABS
650.0000 mg | ORAL_TABLET | Freq: Four times a day (QID) | ORAL | Status: DC | PRN
  Administered 2020-09-16 – 2020-09-18 (×3): 650 mg via ORAL
  Filled 2020-09-15 (×3): qty 2

## 2020-09-15 MED ORDER — TRAZODONE HCL 50 MG PO TABS: 25.0000 mg | ORAL_TABLET | Freq: Every evening | ORAL | Status: DC | PRN

## 2020-09-15 MED ORDER — MOMETASONE FURO-FORMOTEROL FUM 200-5 MCG/ACT IN AERO
2.0000 | INHALATION_SPRAY | Freq: Two times a day (BID) | RESPIRATORY_TRACT | Status: DC
  Administered 2020-09-17 – 2020-09-19 (×5): 2 via RESPIRATORY_TRACT
  Filled 2020-09-15: qty 8.8

## 2020-09-15 MED ORDER — ACETAMINOPHEN 650 MG RE SUPP: 650.0000 mg | Freq: Four times a day (QID) | RECTAL | Status: DC | PRN

## 2020-09-15 MED ORDER — MAGNESIUM HYDROXIDE 400 MG/5ML PO SUSP
30.0000 mL | Freq: Every day | ORAL | Status: DC | PRN
  Administered 2020-09-19: 30 mL via ORAL
  Filled 2020-09-15: qty 30

## 2020-09-15 MED ORDER — HYDROCERIN EX CREA
1.0000 "application " | TOPICAL_CREAM | CUTANEOUS | Status: DC | PRN
  Administered 2020-09-18 – 2020-09-19 (×2): 1 via TOPICAL
  Filled 2020-09-15: qty 113

## 2020-09-15 MED ORDER — ONDANSETRON HCL 4 MG PO TABS: 4.0000 mg | ORAL_TABLET | Freq: Four times a day (QID) | ORAL | Status: DC | PRN

## 2020-09-15 MED ORDER — ONDANSETRON HCL 4 MG/2ML IJ SOLN
4.0000 mg | Freq: Once | INTRAMUSCULAR | Status: AC
Start: 2020-09-15 — End: 2020-09-15
  Administered 2020-09-15: 4 mg via INTRAVENOUS
  Filled 2020-09-15: qty 2

## 2020-09-15 MED ORDER — OMEGA-3-ACID ETHYL ESTERS 1 G PO CAPS
1000.0000 mg | ORAL_CAPSULE | Freq: Every day | ORAL | Status: DC
  Administered 2020-09-17 – 2020-09-18 (×2): 1000 mg via ORAL
  Filled 2020-09-15 (×3): qty 1

## 2020-09-15 MED ORDER — MORPHINE SULFATE (PF) 4 MG/ML IV SOLN
4.0000 mg | Freq: Once | INTRAVENOUS | Status: AC
  Administered 2020-09-15: 4 mg via INTRAVENOUS
  Filled 2020-09-15: qty 1

## 2020-09-15 MED ORDER — ONDANSETRON HCL 4 MG/2ML IJ SOLN: 4.0000 mg | Freq: Four times a day (QID) | INTRAMUSCULAR | Status: DC | PRN

## 2020-09-15 MED ORDER — VITAMIN D 25 MCG (1000 UNIT) PO TABS
2000.0000 [IU] | ORAL_TABLET | Freq: Every day | ORAL | Status: DC
  Administered 2020-09-17 – 2020-09-19 (×3): 2000 [IU] via ORAL
  Filled 2020-09-15 (×4): qty 2

## 2020-09-15 MED ORDER — LEVOTHYROXINE SODIUM 112 MCG PO TABS
112.0000 ug | ORAL_TABLET | Freq: Every day | ORAL | Status: DC
  Administered 2020-09-17 – 2020-09-19 (×3): 112 ug via ORAL
  Filled 2020-09-15 (×4): qty 1

## 2020-09-15 MED ORDER — OLOPATADINE HCL 0.1 % OP SOLN
1.0000 [drp] | Freq: Every day | OPHTHALMIC | Status: DC
  Administered 2020-09-16 – 2020-09-18 (×3): 1 [drp] via OPHTHALMIC
  Filled 2020-09-15: qty 5

## 2020-09-15 MED ORDER — ATORVASTATIN CALCIUM 20 MG PO TABS
40.0000 mg | ORAL_TABLET | Freq: Every evening | ORAL | Status: DC
  Administered 2020-09-16 – 2020-09-18 (×3): 40 mg via ORAL
  Filled 2020-09-15 (×3): qty 2

## 2020-09-15 MED ORDER — MONTELUKAST SODIUM 10 MG PO TABS
10.0000 mg | ORAL_TABLET | Freq: Every day | ORAL | Status: DC
  Administered 2020-09-16 – 2020-09-18 (×3): 10 mg via ORAL
  Filled 2020-09-15 (×3): qty 1

## 2020-09-15 NOTE — ED Triage Notes (Signed)
Pt arrives from home via EMS with left hip pain s/p fall. States she has balance issues and her feet just went out from under her tonight. Fell onto left side. Did not hit head, no LOC. Denies any other injury or complaint. Ppt arrives to ED and left leg shortened when compared to right. Received 50 mcg fentanyl PTA

## 2020-09-15 NOTE — ED Provider Notes (Signed)
[facility] Emergency Department Provider Note ____________________________________________   Event Date/Time   First MD Initiated Contact with Patient 09/15/20 2121     (approximate)  I have reviewed the triage vital signs and the nursing notes.   HISTORY  Chief Complaint Hip Pain and Fall    HPI [name] is a 87 y.o. female with PMH as noted below including atrial fibrillation on Eliquis who presents with a left hip injury, acute onset this evening when the patient fell from standing height while she was putting something away in a cabinet.  The patient states that she frequently has balance problems and lost her balance.  She did not hit her head and had no LOC.  She states that other than the hip pain she has no other injuries.  She has been unable to bear weight on the left leg.  Past Medical History:  Diagnosis Date  . Arthritis   . Asthma   . Atrial fibrillation ([place])   . Cataract   . Eczema   . GERD (gastroesophageal reflux disease)   . Gout of wrist   . Heart murmur   . Pacemaker   . Pseudogout of ankle or foot   . Thyroid disease   . TIA (transient ischemic attack)     Patient Active Problem List   Diagnosis Date Noted  . Closed left hip fracture ([place]) 09/15/2020  . Acute appendicitis 07/25/2019  . Leukocytosis 06/29/2016  . 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 Temporary cerebral vascular dysfunction 02/28/2014  . DD (diverticular disease) 02/28/2014  . Allergic rhinitis 02/28/2014  . Allergy status to sulfonamides 02/28/2014  . History of anticoagulant therapy 02/28/2014  . Arthritis 02/28/2014  . Airway hyperreactivity 02/28/2014  . A-fib ([place]) 02/28/2014  . Bilateral cataracts 02/28/2014  . Dermatitis, eczematoid 02/28/2014  . Allergy to environmental factors 02/28/2014  . Acid reflux 02/28/2014  . Polypharmacy 02/28/2014  . HLD (hyperlipidemia) 02/28/2014  . Adult hypothyroidism 02/28/2014  . BP (high  blood pressure) 02/28/2014  . Arthritis, degenerative 02/28/2014  . Degenerative arthritis of hip 10/09/2013  . 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 Neck rigid 05/30/2013  . 1st degree AV block 10/10/2012  . History of biliary T-tube placement 06/14/2012  . History of cardioversion 06/05/2012  . Avascular necrosis of femoral head ([place]) 06/01/2012  . Lumbar radiculopathy 06/01/2012  . DDD (degenerative disc disease), lumbar 06/01/2012  . H/O cardiac catheterization 10/20/2009    Past Surgical History:  Procedure Laterality Date  . ABLATION    . EYE SURGERY Bilateral    cataract  . foot sugery    . HIP ARTHROPLASTY     right  . IMPLANTABLE CARDIOVERTER DEFIBRILLATOR (ICD) GENERATOR CHANGE Left 05/29/2019   Procedure: PACEMAKER CHANGE OUT;  Surgeon: Isaias Cowman, MD;  Location: [facility] ORS;  Service: Cardiovascular;  Laterality: Left;  . PACEMAKER INSERTION      Prior to Admission medications   Medication Sig Start Date End Date Taking? Authorizing Provider  acetaminophen (TYLENOL) 650 MG CR tablet Take 650 mg by mouth every 8 (eight) hours as needed for pain.   Yes [provider]  apixaban (ELIQUIS) 5 MG TABS tablet Take 5 mg by mouth 2 (two) times daily.  06/26/13  Yes [provider]  atorvastatin (LIPITOR) 40 MG tablet Take 40 mg by mouth every evening.    Yes [provider]  budesonide-formoterol (SYMBICORT) 160-4.5 MCG/ACT inhaler Inhale 2 puffs into the lungs 2 (two) times daily.  09/09/13  Yes [provider]  Cholecalciferol (VITAMIN D3) 50 MCG (2000 UT) TABS Take 2,000 Units by mouth daily.    Yes [provider]  clotrimazole (LOTRIMIN) 1 % cream Apply 1 application topically 2 (two) times daily. Apply to toes of R foot 07/07/20  Yes Cuthriell, Charline Bills, PA-C  diclofenac Sodium (VOLTAREN) 1 % GEL Apply 2 g topically daily as needed (pain).  08/22/13  Yes [provider]  famotidine (PEPCID)  20 MG tablet Take 20 mg by mouth 2 (two) times daily.    Yes [provider]  levothyroxine (SYNTHROID, LEVOTHROID) 112 MCG tablet Take 112 mcg by mouth daily before breakfast.   Yes [provider]  montelukast (SINGULAIR) 10 MG tablet Take 10 mg by mouth at bedtime.  12/27/13  Yes [provider]  Omega-3 1000 MG CAPS Take 1,000 mg by mouth daily.    Yes [provider]  Skin Protectants, Misc. (EUCERIN) cream Apply topically as needed for dry skin. Apply to tops of feet 07/07/20  Yes Cuthriell, Charline Bills, PA-C  triamcinolone (KENALOG) 0.1 % Apply 1 application topically 2 (two) times daily. 07/14/20  Yes Margarette Canada, NP  Olopatadine HCl (PAZEO) 0.7 % SOLN Apply 1 drop to eye at bedtime.     [provider]    Allergies Fluticasone-salmeterol, Albuterol, Ciprofloxacin, Codeine, Colchicine, Promethazine, Valacyclovir, Guaifenesin, and Sulfa antibiotics  Family History  Problem Relation Age of Onset  . Leukemia Father   . Lung cancer Sister   . Breast cancer Neg Hx     Social History Social History   Tobacco Use  . Smoking status: Never Smoker  . Smokeless tobacco: Never Used  Vaping Use  . Vaping Use: Never used  Substance Use Topics  . Alcohol use: Not Currently    Alcohol/week: 0.0 standard drinks  . Drug use: No    Review of Systems  Constitutional: No fever. Eyes: No redness. ENT: No sore throat. Cardiovascular: Denies chest pain. Respiratory: Denies shortness of breath. Gastrointestinal: No vomiting or diarrhea.  Genitourinary: Negative for flank pain. Musculoskeletal: Negative for back pain.  Positive for left hip pain. Skin: Negative for rash. Neurological: Negative for headaches, focal weakness or numbness.   ____________________________________________   PHYSICAL EXAM:  VITAL SIGNS: ED Triage Vitals [09/15/20 2112]  Enc Vitals Group     BP (!) 175/70     Pulse Rate 68     Resp 19     Temp 98.7 F (37.1 C)      Temp Source Oral     SpO2 96 %     Weight 135 lb (61.2 kg)     Height 5\' 4"  (1.626 m)     Head Circumference      Peak Flow      Pain Score 8     Pain Loc      Pain Edu?      Excl. in [place]?     Constitutional: Alert and oriented. Well appearing for age and in no acute distress. Eyes: Conjunctivae are normal.  Head: Atraumatic. Nose: No congestion/rhinnorhea. Mouth/Throat: Mucous membranes are moist.   Neck: Normal range of motion.  Cardiovascular: Normal rate, regular rhythm.  Good peripheral circulation. Respiratory: Normal respiratory effort.  No retractions.  Gastrointestinal: Soft and nontender. No distention.  Genitourinary: No flank tenderness. Musculoskeletal: No lower extremity edema.  Extremities warm and well perfused.  Left lower extremity shortened slightly.  Pain on range of motion of the hip.  Mild tenderness  to the lateral left hip.  No midline spinal tenderness. Neurologic:  Normal speech and language. No gross focal neurologic deficits are appreciated.  Skin:  Skin is warm and dry. No rash noted. Psychiatric: Mood and affect are normal. Speech and behavior are normal.  ____________________________________________   LABS (all labs ordered are listed, but only abnormal results are displayed)  Labs Reviewed  BASIC METABOLIC PANEL - Abnormal; Notable for the following components:      Result Value   Glucose, Bld 149 (*)    BUN 30 (*)    Calcium 8.5 (*)    All other components within normal limits  CBC WITH DIFFERENTIAL/PLATELET - Abnormal; Notable for the following components:   WBC 13.7 (*)    Neutro Abs 10.2 (*)    Monocytes Absolute 1.2 (*)    Abs Immature Granulocytes 0.09 (*)    All other components within normal limits  RESP PANEL BY RT-PCR (FLU A&B, COVID) [id number]  BASIC METABOLIC PANEL  CBC   ____________________________________________  EKG  ED ECG REPORT I, Arta Silence, the attending physician, personally viewed and interpreted  this ECG.  Date: 09/15/2020 EKG Time: 2239 Rate: 66 Rhythm: Paced rhythm ST/T Wave abnormalities: normal Narrative Interpretation: no evidence of acute ischemia  ____________________________________________  RADIOLOGY  XR L hip: Intertrochanteric fracture Chest x-ray interpreted by me shows no focal consolidation or edema ____________________________________________   PROCEDURES  Procedure(s) performed: No  Procedures  Critical Care performed: No ____________________________________________   INITIAL IMPRESSION / ASSESSMENT AND PLAN / ED COURSE  Pertinent labs & imaging results that were available during my care of the patient were reviewed by me and considered in my medical decision making (see chart for details).  87 year old female presents with left hip pain after a mechanical fall from standing height.  The patient did not hit her head or have any LOC.  She was unable to bear weight on the left leg.  On exam, the left lower extremity is neuro/vascular intact but is shortened and she does have pain on any range of motion of the left hip.  Presentation is concerning for a fracture.  We will obtain x-rays, lab work-up, and reassess.  ----------------------------------------- 11:08 PM on 09/15/2020 -----------------------------------------  X-ray confirms an intertrochanteric fracture.  I consulted Dr. Sharlet Salina from orthopedics.  The patient is on Eliquis and will need medical clearance.  I consulted Dr. Gayla Medicus from the hospitalist service for admission.  ____________________________________________   FINAL CLINICAL IMPRESSION(S) / ED DIAGNOSES  Final diagnoses:  Closed nondisplaced intertrochanteric fracture of left femur, initial encounter ([place])      NEW MEDICATIONS STARTED DURING THIS VISIT:  New Prescriptions   No medications on file     Note:  This document was prepared using Dragon voice recognition software and may include unintentional dictation  errors.   Arta Silence, MD 09/15/20 [phone]

## 2020-09-15 NOTE — H&P (Signed)
[place]   PATIENT NAME: [name]    MR#:  [id number]  DATE OF BIRTH:  1924-12-12  DATE OF ADMISSION:  09/15/2020  PRIMARY CARE PHYSICIAN: Ezequiel Kayser, MD   Patient is coming from: Home  REQUESTING/REFERRING PHYSICIAN: Arta Silence, MD CHIEF COMPLAINT:   Chief Complaint  Patient presents with  . Hip Pain  . Fall    HISTORY OF PRESENT ILLNESS:  [name] is a 87 y.o. Caucasian female with medical history significant for asthma, osteoarthritis, atrial fibrillation, eczema, GERD, TIA and hypothyroidism, who presented to the ER with acute onset of left hip pain after having an accidental mechanical fall.  The patient was bending over to put papers in a drawer and while she was turning to start walking back she lost balance and fell on her left side.  She denies any paresthesias or focal muscle weakness.  No headache dizziness or blurred vision.  No presyncope or syncope.  No chest pain or dyspnea or palpitations.  No recent cough or wheezing with hemoptysis.  No other bleeding diathesis.  She denies any nausea or vomiting or abdominal pain.  No dysuria, oliguria or hematuria or flank pain.  ED Course: Upon presentation to the ER, blood pressure was 135/70 with otherwise normal vital signs.  Labs revealed unremarkable BMP.  CBC showed leukocytosis of 13.7 with neutrophilia.  Respiratory panel is currently pending.  Portable chest ray showed EKG as reviewed by me : showed sinus or ectopic atrial rhythm with a rate of 66 and prolonged PR and I will follow with right bundle branch block and left posterior fascicular block and Q waves anterolaterally. Imaging: Portable chest ray showed no acute cardiopulmonary disease.  Left hip x-ray showed comminuted and displaced intertrochanteric fracture of the left femoral neck.  The patient was given 4 mg of IV morphine sulfate and 4 mg IV Zofran.  She will be admitted to a medical bed for further evaluation and  management. PAST MEDICAL HISTORY:   Past Medical History:  Diagnosis Date  . Arthritis   . Asthma   . Atrial fibrillation ([place])   . Cataract   . Eczema   . GERD (gastroesophageal reflux disease)   . Gout of wrist   . Heart murmur   . Pacemaker   . Pseudogout of ankle or foot   . Thyroid disease   . TIA (transient ischemic attack)     PAST SURGICAL HISTORY:   Past Surgical History:  Procedure Laterality Date  . ABLATION    . EYE SURGERY Bilateral    cataract  . foot sugery    . HIP ARTHROPLASTY     right  . IMPLANTABLE CARDIOVERTER DEFIBRILLATOR (ICD) GENERATOR CHANGE Left 05/29/2019   Procedure: PACEMAKER CHANGE OUT;  Surgeon: Isaias Cowman, MD;  Location: [facility] ORS;  Service: Cardiovascular;  Laterality: Left;  . PACEMAKER INSERTION      SOCIAL HISTORY:   Social History   Tobacco Use  . Smoking status: Never Smoker  . Smokeless tobacco: Never Used  Substance Use Topics  . Alcohol use: Not Currently    Alcohol/week: 0.0 standard drinks    FAMILY HISTORY:   Family History  Problem Relation Age of Onset  . Leukemia Father   . Lung cancer Sister   . Breast cancer Neg Hx     DRUG ALLERGIES:   Allergies  Allergen Reactions  . Fluticasone-Salmeterol Anaphylaxis  . Albuterol     Other reaction(s): Other (See Comments) It  burns and makes her cough "too strong for me or something"  . Ciprofloxacin Other (See Comments)    Other reaction(s): Unknown Does not remember  . Codeine Hives  . Colchicine Diarrhea  . Promethazine Other (See Comments)    Unable to speak. Loss muscle control (PHENGERAN)  . Valacyclovir     Other reaction(s): Muscle Pain Could hardly move  . Guaifenesin Palpitations  . Sulfa Antibiotics Rash    REVIEW OF SYSTEMS:   ROS As per history of present illness. All pertinent systems were reviewed above. Constitutional, HEENT, cardiovascular, respiratory, GI, GU, musculoskeletal, neuro, psychiatric, endocrine, integumentary and  hematologic systems were reviewed and are otherwise negative/unremarkable except for positive findings mentioned above in the HPI.   MEDICATIONS AT HOME:   Prior to Admission medications   Medication Sig Start Date End Date Taking? Authorizing Provider  acetaminophen (TYLENOL) 650 MG CR tablet Take 650 mg by mouth every 8 (eight) hours as needed for pain.   Yes [provider]  apixaban (ELIQUIS) 5 MG TABS tablet Take 5 mg by mouth 2 (two) times daily.  06/26/13  Yes [provider]  atorvastatin (LIPITOR) 40 MG tablet Take 40 mg by mouth every evening.    Yes [provider]  budesonide-formoterol (SYMBICORT) 160-4.5 MCG/ACT inhaler Inhale 2 puffs into the lungs 2 (two) times daily.  09/09/13  Yes [provider]  Cholecalciferol (VITAMIN D3) 50 MCG (2000 UT) TABS Take 2,000 Units by mouth daily.    Yes [provider]  clotrimazole (LOTRIMIN) 1 % cream Apply 1 application topically 2 (two) times daily. Apply to toes of R foot 07/07/20  Yes Cuthriell, Charline Bills, PA-C  diclofenac Sodium (VOLTAREN) 1 % GEL Apply 2 g topically daily as needed (pain).  08/22/13  Yes [provider]  famotidine (PEPCID) 20 MG tablet Take 20 mg by mouth 2 (two) times daily.    Yes [provider]  levothyroxine (SYNTHROID, LEVOTHROID) 112 MCG tablet Take 112 mcg by mouth daily before breakfast.   Yes [provider]  montelukast (SINGULAIR) 10 MG tablet Take 10 mg by mouth at bedtime.  12/27/13  Yes [provider]  Omega-3 1000 MG CAPS Take 1,000 mg by mouth daily.    Yes [provider]  Skin Protectants, Misc. (EUCERIN) cream Apply topically as needed for dry skin. Apply to tops of feet 07/07/20  Yes Cuthriell, Charline Bills, PA-C  triamcinolone (KENALOG) 0.1 % Apply 1 application topically 2 (two) times daily. 07/14/20  Yes Margarette Canada, NP  Olopatadine HCl (PAZEO) 0.7 % SOLN Apply 1 drop to eye at bedtime.     [provider]       VITAL SIGNS:  Blood pressure (!) 156/69, pulse (!) 59, temperature 98.7 F (37.1 C), temperature source Oral, resp. rate 14, height 5\' 4"  (1.626 m), weight 61.2 kg, SpO2 97 %.  PHYSICAL EXAMINATION:  Physical Exam  GENERAL:  87 y.o.-year-old Caucasian patient lying in the bed with no acute distress.  EYES: Pupils equal, round, reactive to light and accommodation. No scleral icterus. Extraocular muscles intact.  HEENT: Head atraumatic, normocephalic. Oropharynx and nasopharynx clear.  NECK:  Supple, no jugular venous distention. No thyroid enlargement, no tenderness.  LUNGS: Normal breath sounds bilaterally, no wheezing, rales,rhonchi or crepitation. No use of accessory muscles of respiration.  CARDIOVASCULAR: Regular rate and rhythm, S1, S2 normal. No murmurs, rubs, or gallops.  ABDOMEN: Soft, nondistended, nontender. Bowel sounds present. No organomegaly or mass.  EXTREMITIES: No pedal  edema, cyanosis, or clubbing.  NEUROLOGIC: Cranial nerves II through XII are intact. Muscle strength 5/5 in all extremities. Sensation intact. Gait not checked. Musculoskeletal: Left hip tenderness. PSYCHIATRIC: The patient is alert and oriented x 3.  Normal affect and good eye contact. SKIN: No obvious rash, lesion, or ulcer.   LABORATORY PANEL:   CBC Recent Labs  Lab 09/15/20 2117  WBC 13.7*  HGB 12.9  HCT 39.9  PLT 249   ------------------------------------------------------------------------------------------------------------------  Chemistries  Recent Labs  Lab 09/15/20 2117  NA 136  K 4.0  CL 101  CO2 26  GLUCOSE 149*  BUN 30*  CREATININE 0.57  CALCIUM 8.5*   ------------------------------------------------------------------------------------------------------------------  Cardiac Enzymes No results for input(s): TROPONINI in the last 168  hours. ------------------------------------------------------------------------------------------------------------------  RADIOLOGY:  DG Chest 1 View  Result Date: 09/15/2020 CLINICAL DATA:  87 year old female with fall and left hip pain. EXAM: CHEST  1 VIEW COMPARISON:  Chest radiograph dated 04/24/2019. FINDINGS: No focal consolidation, pleural effusion, or pneumothorax. Mild chronic interstitial coarsening and bronchitic changes. Top-normal cardiac size. Atherosclerotic calcification of the aorta. Left pectoral pacemaker device. No acute osseous pathology. IMPRESSION: No active cardiopulmonary disease. Electronically Signed   By: Anner Crete M.D.   On: 09/15/2020 22:32   DG HIP UNILAT WITH PELVIS 2-3 VIEWS LEFT  Result Date: 09/15/2020 CLINICAL DATA:  87 year old female with fall and left hip pain. EXAM: DG HIP (WITH OR WITHOUT PELVIS) 2-3V LEFT COMPARISON:  CT abdomen pelvis dated 07/25/2019. FINDINGS: There is a comminuted and displaced intertrochanteric fracture of the left femoral neck. There is no dislocation. The bones are osteopenic. There is a total right hip arthroplasty. The soft tissues are unremarkable. IMPRESSION: Comminuted and displaced intertrochanteric fracture of the left femoral neck. Electronically Signed   By: Anner Crete M.D.   On: 09/15/2020 22:31      IMPRESSION AND PLAN:  Active Problems:   Closed left hip fracture ([place])  1.  Left closed hip fracture status post mechanical fall. - The patient will be admitted to a med-surgical bed. - Pain management will be provided. - We will hold off Eliquis. - Orthopedic consult to be obtained. - Dr. Sharlet Salina was notified about the patient.  2.  Dyslipidemia. - We will continue statin therapy.  3.  Paroxysmal atrial fibrillation. - We will holding off Eliquis.  4.  Asthma. - We will continue Symbicort and Singulair.  DVT prophylaxis: SCDs pending postoperative period. Code Status: full code.  This was  discussed with the patient. Family Communication:  The plan of care was discussed in details with the patient (and her nephew who was with her in the room). I answered all questions. The patient agreed to proceed with the above mentioned plan. Further management will depend upon hospital course. Disposition Plan: Back to previous home environment Consults called: Orthopedic consult. All the records are reviewed and case discussed with ED provider.  Status is: Inpatient  Remains inpatient appropriate because:Ongoing active pain requiring inpatient pain management, Ongoing diagnostic testing needed not appropriate for outpatient work up, Unsafe d/c plan, IV treatments appropriate due to intensity of illness or inability to take PO and Inpatient level of care appropriate due to severity of illness   Dispo: The patient is from: Home              Anticipated d/c is to: SNF              Patient currently is not medically stable to d/c.   Difficult to  place patient No  TOTAL TIME TAKING CARE OF THIS PATIENT: 55 minutes.    Christel Mormon M.D on 09/15/2020 at 11:04 PM  [facility]   From 7 PM-7 AM, contact night-coverage [email or web address]  CC: Primary care physician; Ezequiel Kayser, MD

## 2020-09-15 NOTE — ED Notes (Signed)
Patient transported to radiology

## 2020-09-16 ENCOUNTER — Inpatient Hospital Stay: Payer: Medicare Other | Admitting: Certified Registered"

## 2020-09-16 ENCOUNTER — Encounter
Admission: EM | Disposition: A | Discharge status: Rehabilitation Facility | Payer: Self-pay | Source: Home / Self Care | Attending: Internal Medicine

## 2020-09-16 ENCOUNTER — Other Ambulatory Visit: Payer: Self-pay

## 2020-09-16 ENCOUNTER — Inpatient Hospital Stay: Payer: Medicare Other

## 2020-09-16 ENCOUNTER — Encounter: Payer: Self-pay | Admitting: Family Medicine

## 2020-09-16 DIAGNOSIS — S72145A Nondisplaced intertrochanteric fracture of left femur, initial encounter for closed fracture: Secondary | ICD-10-CM | POA: Diagnosis not present

## 2020-09-16 DIAGNOSIS — I480 Paroxysmal atrial fibrillation: Secondary | ICD-10-CM

## 2020-09-16 DIAGNOSIS — Z950 Presence of cardiac pacemaker: Secondary | ICD-10-CM

## 2020-09-16 HISTORY — PX: INTRAMEDULLARY (IM) NAIL INTERTROCHANTERIC: SHX5875

## 2020-09-16 LAB — TYPE AND SCREEN
ABO/RH(D): O POS
Antibody Screen: NEGATIVE

## 2020-09-16 LAB — BASIC METABOLIC PANEL
Anion gap: 7 (ref 5–15)
BUN: 30 mg/dL — ABNORMAL HIGH (ref 8–23)
CO2: 27 mmol/L (ref 22–32)
Calcium: 8.3 mg/dL — ABNORMAL LOW (ref 8.9–10.3)
Chloride: 104 mmol/L (ref 98–111)
Creatinine, Ser: 0.64 mg/dL (ref 0.44–1.00)
GFR, Estimated: >60 mL/min (ref >60)
Glucose, Bld: 137 mg/dL — ABNORMAL HIGH (ref 70–99)
Potassium: 4.5 mmol/L (ref 3.5–5.1)
Sodium: 138 mmol/L (ref 135–145)

## 2020-09-16 LAB — CBC
HCT: 34.4 % — ABNORMAL LOW (ref 36.0–46.0)
Hemoglobin: 11.4 g/dL — ABNORMAL LOW (ref 12.0–15.0)
MCH: 27.9 pg (ref 26.0–34.0)
MCHC: 33.1 g/dL (ref 30.0–36.0)
MCV: 84.1 fL (ref 80.0–100.0)
Platelets: 216 10*3/uL (ref 150–400)
RBC: 4.09 MIL/uL (ref 3.87–5.11)
RDW: 14.4 % (ref 11.5–15.5)
WBC: 17.8 10*3/uL — ABNORMAL HIGH (ref 4.0–10.5)
nRBC: 0 % (ref 0.0–0.2)

## 2020-09-16 LAB — SURGICAL PCR SCREEN
MRSA, PCR: NEGATIVE
Staphylococcus aureus: NEGATIVE

## 2020-09-16 LAB — ABO/RH: ABO/RH(D): O POS

## 2020-09-16 SURGERY — FIXATION, FRACTURE, INTERTROCHANTERIC, WITH INTRAMEDULLARY ROD
Anesthesia: General | Site: Hip | Laterality: Left

## 2020-09-16 MED ORDER — FENTANYL CITRATE (PF) 100 MCG/2ML IJ SOLN
INTRAMUSCULAR | Status: AC
  Filled 2020-09-16: qty 2

## 2020-09-16 MED ORDER — LIDOCAINE HCL 1 % IJ SOLN
INTRAMUSCULAR | Status: DC | PRN
  Administered 2020-09-16: 25 mL

## 2020-09-16 MED ORDER — BUPIVACAINE-EPINEPHRINE (PF) 0.5% -1:200000 IJ SOLN
INTRAMUSCULAR | Status: AC
  Filled 2020-09-16: qty 30

## 2020-09-16 MED ORDER — TRAMADOL HCL 50 MG PO TABS
50.0000 mg | ORAL_TABLET | Freq: Four times a day (QID) | ORAL | Status: DC | PRN
  Administered 2020-09-16 – 2020-09-18 (×3): 50 mg via ORAL
  Filled 2020-09-16 (×3): qty 1

## 2020-09-16 MED ORDER — FENTANYL CITRATE (PF) 100 MCG/2ML IJ SOLN
INTRAMUSCULAR | Status: DC | PRN
  Administered 2020-09-16: 50 ug via INTRAVENOUS
  Administered 2020-09-16 (×2): 25 ug via INTRAVENOUS

## 2020-09-16 MED ORDER — PHENYLEPHRINE HCL (PRESSORS) 10 MG/ML IV SOLN
INTRAVENOUS | Status: DC | PRN
  Administered 2020-09-16 (×3): 100 ug via INTRAVENOUS

## 2020-09-16 MED ORDER — LIDOCAINE HCL (PF) 1 % IJ SOLN
INTRAMUSCULAR | Status: AC
  Filled 2020-09-16: qty 30

## 2020-09-16 MED ORDER — ONDANSETRON HCL 4 MG/2ML IJ SOLN
INTRAMUSCULAR | Status: DC | PRN
  Administered 2020-09-16: 4 mg via INTRAVENOUS

## 2020-09-16 MED ORDER — PROPOFOL 10 MG/ML IV BOLUS
INTRAVENOUS | Status: DC | PRN
  Administered 2020-09-16: 80 mg via INTRAVENOUS

## 2020-09-16 MED ORDER — DEXAMETHASONE SODIUM PHOSPHATE 10 MG/ML IJ SOLN
INTRAMUSCULAR | Status: DC | PRN
  Administered 2020-09-16: 5 mg via INTRAVENOUS

## 2020-09-16 MED ORDER — SODIUM CHLORIDE 0.9 % IV SOLN
INTRAVENOUS | Status: AC
  Filled 2020-09-16: qty 10

## 2020-09-16 MED ORDER — LIDOCAINE HCL (CARDIAC) PF 100 MG/5ML IV SOSY
PREFILLED_SYRINGE | INTRAVENOUS | Status: DC | PRN
  Administered 2020-09-16: 50 mg via INTRAVENOUS

## 2020-09-16 MED ORDER — GENTAMICIN SULFATE 40 MG/ML IJ SOLN
INTRAMUSCULAR | Status: AC
  Filled 2020-09-16: qty 2

## 2020-09-16 MED ORDER — HYDROMORPHONE HCL 1 MG/ML IJ SOLN
0.5000 mg | INTRAMUSCULAR | Status: DC | PRN
  Administered 2020-09-16: 0.5 mg via INTRAVENOUS
  Filled 2020-09-16: qty 1

## 2020-09-16 MED ORDER — FENTANYL CITRATE (PF) 100 MCG/2ML IJ SOLN: 25.0000 ug | INTRAMUSCULAR | Status: DC | PRN

## 2020-09-16 MED ORDER — CEFAZOLIN (ANCEF) 1 G IV SOLR: 1.0000 g | INTRAVENOUS | Status: DC

## 2020-09-16 MED ORDER — BUPIVACAINE-EPINEPHRINE 0.5% -1:200000 IJ SOLN
INTRAMUSCULAR | Status: DC | PRN
  Administered 2020-09-16: 25 mL

## 2020-09-16 MED ORDER — LACTATED RINGERS IV SOLN: INTRAVENOUS | Status: DC | PRN

## 2020-09-16 MED ORDER — CEFAZOLIN SODIUM-DEXTROSE 1-4 GM/50ML-% IV SOLN
1.0000 g | Freq: Three times a day (TID) | INTRAVENOUS | Status: DC
  Filled 2020-09-16 (×3): qty 50

## 2020-09-16 MED ORDER — ONDANSETRON HCL 4 MG/2ML IJ SOLN: 4.0000 mg | Freq: Once | INTRAMUSCULAR | Status: DC | PRN

## 2020-09-16 MED ORDER — SODIUM CHLORIDE 0.9 % IV SOLN
1.0000 g | Freq: Three times a day (TID) | INTRAVENOUS | Status: AC
  Administered 2020-09-16 – 2020-09-17 (×3): 1 g via INTRAVENOUS
  Filled 2020-09-16 (×4): qty 10

## 2020-09-16 MED ORDER — ROCURONIUM BROMIDE 100 MG/10ML IV SOLN
INTRAVENOUS | Status: DC | PRN
  Administered 2020-09-16: 40 mg via INTRAVENOUS

## 2020-09-16 MED ORDER — SODIUM CHLORIDE 0.9 % IV SOLN
1.0000 g | INTRAVENOUS | Status: AC
  Administered 2020-09-16: 1 g via INTRAVENOUS

## 2020-09-16 MED ORDER — MUPIROCIN 2 % EX OINT
1.0000 "application " | TOPICAL_OINTMENT | Freq: Two times a day (BID) | CUTANEOUS | Status: DC
  Filled 2020-09-16: qty 22

## 2020-09-16 SURGICAL SUPPLY — 49 items
BIT DRILL INTERTAN LAG SCREW (BIT) ×2 IMPLANT
BIT DRILL LONG 4.0 (BIT) ×1 IMPLANT
BNDG COHESIVE 6X5 TAN STRL LF (GAUZE/BANDAGES/DRESSINGS) ×6 IMPLANT
CHLORAPREP W/TINT 26 (MISCELLANEOUS) ×2 IMPLANT
COVER WAND RF STERILE (DRAPES) ×2 IMPLANT
DRAPE 3/4 80X56 (DRAPES) ×4 IMPLANT
DRAPE C-ARM 42X72 X-RAY (DRAPES) ×2 IMPLANT
DRAPE SURG 17X11 SM STRL (DRAPES) ×4 IMPLANT
DRAPE U-SHAPE 47X51 STRL (DRAPES) ×2 IMPLANT
DRILL BIT LONG 4.0 (BIT) ×2
DRSG OPSITE POSTOP 3X4 (GAUZE/BANDAGES/DRESSINGS) ×4 IMPLANT
DRSG OPSITE POSTOP 4X14 (GAUZE/BANDAGES/DRESSINGS) IMPLANT
DRSG OPSITE POSTOP 4X6 (GAUZE/BANDAGES/DRESSINGS) ×2 IMPLANT
ELECT REM PT RETURN 9FT ADLT (ELECTROSURGICAL) ×2
ELECTRODE REM PT RTRN 9FT ADLT (ELECTROSURGICAL) ×1 IMPLANT
GAUZE SPONGE 4X4 12PLY STRL (GAUZE/BANDAGES/DRESSINGS) ×2 IMPLANT
GAUZE XEROFORM 1X8 LF (GAUZE/BANDAGES/DRESSINGS) ×2 IMPLANT
GLOVE SURG ENC MOIS LTX SZ7.5 (GLOVE) ×2 IMPLANT
GLOVE SURG UNDER POLY LF SZ7.5 (GLOVE) ×2 IMPLANT
GOWN L4 XLG 20 PK N/S (GOWN DISPOSABLE) ×2 IMPLANT
GOWN STRL REUS W/ TWL LRG LVL3 (GOWN DISPOSABLE) ×1 IMPLANT
GOWN STRL REUS W/TWL LRG LVL3 (GOWN DISPOSABLE) ×2
GUIDE PIN 3.2X343 (PIN) ×2
GUIDE PIN 3.2X343MM (PIN) ×4
HEMOVAC 400CC 10FR (MISCELLANEOUS) IMPLANT
HOLDER FOLEY CATH W/STRAP (MISCELLANEOUS) ×2 IMPLANT
KIT TURNOVER CYSTO (KITS) ×2 IMPLANT
MANIFOLD NEPTUNE II (INSTRUMENTS) ×2 IMPLANT
MAT ABSORB  FLUID 56X50 GRAY (MISCELLANEOUS) ×2
MAT ABSORB FLUID 56X50 GRAY (MISCELLANEOUS) ×1 IMPLANT
NAIL INTERTAN 10X18 130D 10S (Nail) ×2 IMPLANT
NS IRRIG 1000ML POUR BTL (IV SOLUTION) ×2 IMPLANT
PACK HIP COMPR (MISCELLANEOUS) ×2 IMPLANT
PAD ABD DERMACEA PRESS 5X9 (GAUZE/BANDAGES/DRESSINGS) ×2 IMPLANT
PAD ARMBOARD 7.5X6 YLW CONV (MISCELLANEOUS) ×2 IMPLANT
PIN GUIDE 3.2X343MM (PIN) ×2 IMPLANT
SCREW LAG COMPR KIT 95/90 (Screw) ×2 IMPLANT
SCREW TRIGEN LOW PROF 5.0X35 (Screw) ×2 IMPLANT
STAPLER SKIN PROX 35W (STAPLE) ×2 IMPLANT
SUCTION FRAZIER HANDLE 10FR (MISCELLANEOUS) ×2
SUCTION TUBE FRAZIER 10FR DISP (MISCELLANEOUS) ×1 IMPLANT
SUT VIC AB 0 CT1 36 (SUTURE) ×4 IMPLANT
SUT VIC AB 2-0 CT1 (SUTURE) ×2 IMPLANT
SUT VIC AB 2-0 CT1 27 (SUTURE) ×2
SUT VIC AB 2-0 CT1 TAPERPNT 27 (SUTURE) ×1 IMPLANT
SUT VICRYL 0 AB UR-6 (SUTURE) ×2 IMPLANT
SYR 30ML LL (SYRINGE) ×2 IMPLANT
TAPE PAPER 1/2X10 TAN MEDIPORE (MISCELLANEOUS) ×2 IMPLANT
TRAY FOLEY SLVR 16FR LF STAT (SET/KITS/TRAYS/PACK) ×2 IMPLANT

## 2020-09-16 NOTE — Progress Notes (Addendum)
[facility] PROGRESS NOTE   Senna Lape LOV:[id number] DOB: [birth year] DOA: 09/15/2020  PCP: Ezequiel Kayser, MD  Brief History/Interval Summary: 87 y.o. Caucasian female with medical history significant for asthma, osteoarthritis, atrial fibrillation, eczema, GERD, TIA and hypothyroidism, who presented to the ER with acute onset of left hip pain after having an accidental mechanical fall.    Patient was found to have a left hip fracture.  She was hospitalized for further management.    Reason for Visit: Left hip fracture  Consultants: Orthopedic  Procedures: None yet  Antibiotics: Anti-infectives (From admission, onward)   Start     Dose/Rate Route Frequency Ordered Stop   09/16/20 1500  ceFAZolin (ANCEF) powder 1 g  Status:  Discontinued        1 g Other To Surgery 09/16/20 0758 09/16/20 0759   09/16/20 1500  ceFAZolin (ANCEF) IVPB 1 g/50 mL premix        1 g 100 mL/hr over 30 Minutes Intravenous On call to O.R. 09/16/20 0801 09/17/20 0559      Subjective/Interval History: Patient seems to be distracted this morning.  Complains of pain in the left elbow area.  Denies any other complaints at this time.  No chest pain or shortness of breath.    Assessment/Plan:  Left hip fracture secondary to mechanical fall Eliquis is on hold.  Orthopedics has been consulted.  Plan is for surgery later today.  Patient currently NPO.  EKG shows paced rhythm.  History of pacemaker placement/paroxysmal atrial fibrillation Pacemaker was placed in 2015 for SA node dysfunction with recent generator change in 2021.  Last seen by cardiology in February 2022.  Cardiac status was noted to be stable at that time. Atrial fibrillation is noted to be stable.  Eliquis is on hold currently for surgery.  Leukocytosis Probably reactive.  Noted to be afebrile.  No other obvious source of infection.  Continue to monitor for now.  History of asthma Continue inhalers.  Dyslipidemia Continue  statin.  Hypothyroidism Continue levothyroxine.   DVT Prophylaxis: SCDs for now.  Definitive chemoprophylaxis after surgery. Code Status: Full code Family Communication: No family at bedside Disposition Plan: Unclear for now.  May need to go to skilled nursing facility for rehab  Status is: Inpatient  Remains inpatient appropriate because:Ongoing active pain requiring inpatient pain management, Unsafe d/c plan and Inpatient level of care appropriate due to severity of illness   Dispo: The patient is from: Home              Anticipated d/c is to: To be determined              Patient currently is not medically stable to d/c.   Difficult to place patient No     Medications:  Scheduled: . atorvastatin  40 mg Oral QPM  . cholecalciferol  2,000 Units Oral Daily  . famotidine  20 mg Oral BID  . levothyroxine  112 mcg Oral Q0600  . mometasone-formoterol  2 puff Inhalation BID  . montelukast  10 mg Oral QHS  . olopatadine  1 drop Both Eyes QHS  . omega-3 acid ethyl esters  1,000 mg Oral Daily   Continuous: . sodium chloride 100 mL/hr at 09/16/20 0436  .  ceFAZolin (ANCEF) IV     OAC:[id number] **OR** acetaminophen, hydrocerin, magnesium hydroxide, ondansetron **OR** ondansetron (ZOFRAN) IV, traZODone   Objective:  Vital Signs  Vitals:   09/15/20 2300 09/16/20 0011 09/16/20 0439 09/16/20 0800  BP: (!) 147/75 130/60 Marland Kitchen)  138/59 129/60  Pulse: (!) 59 62 60 66  Resp: 17 16 16 17   Temp: 97.6 F (36.4 C) 97.8 F (36.6 C) 98.7 F (37.1 C) 98.3 F (36.8 C)  TempSrc: Oral Oral Oral   SpO2: 95% 93% 97% 96%  Weight:  66.7 kg    Height:        Intake/Output Summary (Last 24 hours) at 09/16/2020 0952 Last data filed at 09/16/2020 0445 Gross per 24 hour  Intake 385.53 ml  Output 0 ml  Net 385.53 ml   Filed Weights   09/15/20 2112 09/16/20 0011  Weight: 61.2 kg 66.7 kg    General appearance: Awake alert.  In no distress.  Somewhat distracted Resp: Clear to  auscultation bilaterally.  Normal effort Cardio: S1-S2 is normal regular.  No S3-S4.  No rubs murmurs or bruit GI: Abdomen is soft.  Nontender nondistended.  Bowel sounds are present normal.  No masses organomegaly Extremities: Left lower extremity is externally rotated. Neurologic:  No focal neurological deficits.    Lab Results:  Data Reviewed: I have personally reviewed following labs and imaging studies  CBC: Recent Labs  Lab 09/15/20 2117 09/16/20 0546  WBC 13.7* 17.8*  NEUTROABS 10.2*  --   HGB 12.9 11.4*  HCT 39.9 34.4*  MCV 85.4 84.1  PLT 249 893    Basic Metabolic Panel: Recent Labs  Lab 09/15/20 2117 09/16/20 0546  NA 136 138  K 4.0 4.5  CL 101 104  CO2 26 27  GLUCOSE 149* 137*  BUN 30* 30*  CREATININE 0.57 0.64  CALCIUM 8.5* 8.3*    GFR: Estimated Creatinine Clearance: 41.3 mL/min (by C-G formula based on SCr of 0.64 mg/dL).    Recent Results (from the past 240 hour(s))  Resp Panel by RT-PCR (Flu A&B, Covid) Nasopharyngeal Swab     Status: None   Collection Time: 09/15/20 10:39 PM   Specimen: Nasopharyngeal Swab; Nasopharyngeal(NP) swabs in vial transport medium  Result Value Ref Range Status   SARS Coronavirus 2 by RT PCR NEGATIVE NEGATIVE Final    Comment: (NOTE) SARS-CoV-2 target nucleic acids are NOT DETECTED.  The SARS-CoV-2 RNA is generally detectable in upper respiratory specimens during the acute phase of infection. The lowest concentration of SARS-CoV-2 viral copies this assay can detect is 138 copies/mL. A negative result does not preclude SARS-Cov-2 infection and should not be used as the sole basis for treatment or other patient management decisions. A negative result may occur with  improper specimen collection/handling, submission of specimen other than nasopharyngeal swab, presence of viral mutation(s) within the areas targeted by this assay, and inadequate number of viral copies(<138 copies/mL). A negative result must be  combined with clinical observations, patient history, and epidemiological information. The expected result is Negative.  Fact Sheet for Patients:  EntrepreneurPulse.com.au  Fact Sheet for Healthcare Providers:  IncredibleEmployment.be  This test is no t yet approved or cleared by the [place] FDA and  has been authorized for detection and/or diagnosis of SARS-CoV-2 by FDA under an Emergency Use Authorization (EUA). This EUA will remain  in effect (meaning this test can be used) for the duration of the COVID-19 declaration under Section 564(b)(1) of the Act, 21 U.S.C.section 360bbb-3(b)(1), unless the authorization is terminated  or revoked sooner.       Influenza A by PCR NEGATIVE NEGATIVE Final   Influenza B by PCR NEGATIVE NEGATIVE Final    Comment: (NOTE) The Xpert Xpress SARS-CoV-2/FLU/RSV plus assay is intended as an aid in  the diagnosis of influenza from Nasopharyngeal swab specimens and should not be used as a sole basis for treatment. Nasal washings and aspirates are unacceptable for Xpert Xpress SARS-CoV-2/FLU/RSV testing.  Fact Sheet for Patients: EntrepreneurPulse.com.au  Fact Sheet for Healthcare Providers: IncredibleEmployment.be  This test is not yet approved or cleared by the [place] FDA and has been authorized for detection and/or diagnosis of SARS-CoV-2 by FDA under an Emergency Use Authorization (EUA). This EUA will remain in effect (meaning this test can be used) for the duration of the COVID-19 declaration under Section 564(b)(1) of the Act, 21 U.S.C. section 360bbb-3(b)(1), unless the authorization is terminated or revoked.  Performed at [facility], [address] [place], [place] [address]   Surgical PCR screen     Status: None   Collection Time: 09/16/20  4:23 AM   Specimen: Nasal Mucosa; Nasal Swab  Result Value Ref Range Status   MRSA, PCR NEGATIVE  NEGATIVE Final   Staphylococcus aureus NEGATIVE NEGATIVE Final    Comment: (NOTE) The Xpert SA Assay (FDA approved for NASAL specimens in patients 75 years of age and older), is one component of a comprehensive surveillance program. It is not intended to diagnose infection nor to guide or monitor treatment. Performed at [facility], [address], [place], [place] [address]       Radiology Studies: DG Chest 1 View  Result Date: 09/15/2020 CLINICAL DATA:  87 year old female with fall and left hip pain. EXAM: CHEST  1 VIEW COMPARISON:  Chest radiograph dated 04/24/2019. FINDINGS: No focal consolidation, pleural effusion, or pneumothorax. Mild chronic interstitial coarsening and bronchitic changes. Top-normal cardiac size. Atherosclerotic calcification of the aorta. Left pectoral pacemaker device. No acute osseous pathology. IMPRESSION: No active cardiopulmonary disease. Electronically Signed   By: Anner Crete M.D.   On: 09/15/2020 22:32   DG HIP UNILAT WITH PELVIS 2-3 VIEWS LEFT  Result Date: 09/15/2020 CLINICAL DATA:  87 year old female with fall and left hip pain. EXAM: DG HIP (WITH OR WITHOUT PELVIS) 2-3V LEFT COMPARISON:  CT abdomen pelvis dated 07/25/2019. FINDINGS: There is a comminuted and displaced intertrochanteric fracture of the left femoral neck. There is no dislocation. The bones are osteopenic. There is a total right hip arthroplasty. The soft tissues are unremarkable. IMPRESSION: Comminuted and displaced intertrochanteric fracture of the left femoral neck. Electronically Signed   By: Anner Crete M.D.   On: 09/15/2020 22:31       LOS: 1 day   [facility] Pager on [email or web address]  09/16/2020, 9:52 AM

## 2020-09-16 NOTE — Anesthesia Preprocedure Evaluation (Addendum)
Anesthesia Evaluation  Patient identified by MRN, date of birth, ID band Patient awake    Reviewed: Allergy & Precautions, NPO status , Patient's Chart, lab work & pertinent test results  History of Anesthesia Complications Negative for: history of anesthetic complications  Airway Mallampati: III       Dental  (+) Edentulous Upper, Partial Upper   Pulmonary asthma , neg sleep apnea, COPD,  COPD inhaler, Not current smoker,           Cardiovascular hypertension, Pt. on medications (-) Past MI and (-) CHF + dysrhythmias Atrial Fibrillation + pacemaker + Valvular Problems/Murmurs MR      Neuro/Psych TIA Transient Visual Symptoms TIA (Pt unsure)negative psych ROS   GI/Hepatic Neg liver ROS, GERD  Medicated,  Endo/Other  neg diabetesHypothyroidism   Renal/GU negative Renal ROS  negative genitourinary   Musculoskeletal  (+) Arthritis , Osteoarthritis,    Abdominal   Peds negative pediatric ROS (+)  Hematology negative hematology ROS (+) Eliquis last dose 09/15/20   Anesthesia Other Findings   Reproductive/Obstetrics negative OB ROS                                                             Anesthesia Evaluation  Patient identified by MRN, date of birth, ID band Patient awake    Reviewed: Allergy & Precautions, NPO status , Patient's Chart, lab work & pertinent test results  History of Anesthesia Complications Negative for: history of anesthetic complications  Airway Mallampati: II  TM Distance: >3 FB Neck ROM: Full    Dental  (+) Lower Dentures   Pulmonary asthma ,    breath sounds clear to auscultation- rhonchi (-) wheezing      Cardiovascular hypertension, (-) CAD, (-) Past MI, (-) Cardiac Stents and (-) CABG + dysrhythmias Atrial Fibrillation + pacemaker  Rhythm:Regular Rate:Normal - Systolic murmurs and - Diastolic murmurs    Neuro/Psych TIAnegative psych ROS    GI/Hepatic Neg liver ROS, GERD  ,  Endo/Other  neg diabetesHypothyroidism   Renal/GU negative Renal ROS     Musculoskeletal  (+) Arthritis ,   Abdominal (+) - obese,   Peds  Hematology negative hematology ROS (+)   Anesthesia Other Findings Past Medical History: No date: Arthritis No date: Asthma No date: Atrial fibrillation (HCC) No date: Cataract No date: Eczema No date: GERD (gastroesophageal reflux disease) No date: Gout of wrist No date: Heart murmur No date: Pacemaker No date: Pseudogout of ankle or foot No date: Thyroid disease No date: TIA (transient ischemic attack)   Reproductive/Obstetrics                             Anesthesia Physical Anesthesia Plan  ASA: III  Anesthesia Plan: General   Post-op Pain Management:    Induction: Intravenous  PONV Risk Score and Plan: 2 and Propofol infusion  Airway Management Planned: Natural Airway  Additional Equipment:   Intra-op Plan:   Post-operative Plan:   Informed Consent: I have reviewed the patients History and Physical, chart, labs and discussed the procedure including the risks, benefits and alternatives for the proposed anesthesia with the patient or authorized representative who has indicated his/her understanding and acceptance.     Dental advisory given  Plan Discussed with:  CRNA and Anesthesiologist  Anesthesia Plan Comments:         Anesthesia Quick Evaluation                                   Anesthesia Evaluation  Patient identified by MRN, date of birth, ID band Patient awake    Reviewed: Allergy & Precautions, NPO status , Patient's Chart, lab work & pertinent test results  History of Anesthesia Complications Negative for: history of anesthetic complications  Airway Mallampati: II  TM Distance: >3 FB Neck ROM: Full    Dental  (+) Lower Dentures   Pulmonary asthma ,    breath sounds clear to auscultation- rhonchi (-)  wheezing      Cardiovascular hypertension, (-) CAD, (-) Past MI, (-) Cardiac Stents and (-) CABG + dysrhythmias Atrial Fibrillation + pacemaker  Rhythm:Regular Rate:Normal - Systolic murmurs and - Diastolic murmurs    Neuro/Psych TIAnegative psych ROS   GI/Hepatic Neg liver ROS, GERD  ,  Endo/Other  neg diabetesHypothyroidism   Renal/GU negative Renal ROS     Musculoskeletal  (+) Arthritis ,   Abdominal (+) - obese,   Peds  Hematology negative hematology ROS (+)   Anesthesia Other Findings Past Medical History: No date: Arthritis No date: Asthma No date: Atrial fibrillation (HCC) No date: Cataract No date: Eczema No date: GERD (gastroesophageal reflux disease) No date: Gout of wrist No date: Heart murmur No date: Pacemaker No date: Pseudogout of ankle or foot No date: Thyroid disease No date: TIA (transient ischemic attack)   Reproductive/Obstetrics                             Anesthesia Physical Anesthesia Plan  ASA: III  Anesthesia Plan: General   Post-op Pain Management:    Induction: Intravenous  PONV Risk Score and Plan: 2 and Propofol infusion  Airway Management Planned: Natural Airway  Additional Equipment:   Intra-op Plan:   Post-operative Plan:   Informed Consent: I have reviewed the patients History and Physical, chart, labs and discussed the procedure including the risks, benefits and alternatives for the proposed anesthesia with the patient or authorized representative who has indicated his/her understanding and acceptance.     Dental advisory given  Plan Discussed with: CRNA and Anesthesiologist  Anesthesia Plan Comments:         Anesthesia Quick Evaluation                                   Anesthesia Evaluation  Patient identified by MRN, date of birth, ID band Patient awake    Reviewed: Allergy & Precautions, NPO status , Patient's Chart, lab work & pertinent test results  History of  Anesthesia Complications Negative for: history of anesthetic complications  Airway Mallampati: II  TM Distance: >3 FB Neck ROM: Full    Dental  (+) Lower Dentures   Pulmonary asthma ,    breath sounds clear to auscultation- rhonchi (-) wheezing      Cardiovascular hypertension, (-) CAD, (-) Past MI, (-) Cardiac Stents and (-) CABG + dysrhythmias Atrial Fibrillation + pacemaker  Rhythm:Regular Rate:Normal - Systolic murmurs and - Diastolic murmurs    Neuro/Psych TIAnegative psych ROS   GI/Hepatic Neg liver ROS, GERD  ,  Endo/Other  neg diabetesHypothyroidism   Renal/GU negative Renal  ROS     Musculoskeletal  (+) Arthritis ,   Abdominal (+) - obese,   Peds  Hematology negative hematology ROS (+)   Anesthesia Other Findings Past Medical History: No date: Arthritis No date: Asthma No date: Atrial fibrillation (HCC) No date: Cataract No date: Eczema No date: GERD (gastroesophageal reflux disease) No date: Gout of wrist No date: Heart murmur No date: Pacemaker No date: Pseudogout of ankle or foot No date: Thyroid disease No date: TIA (transient ischemic attack)   Reproductive/Obstetrics                             Anesthesia Physical Anesthesia Plan  ASA: III  Anesthesia Plan: General   Post-op Pain Management:    Induction: Intravenous  PONV Risk Score and Plan: 2 and Propofol infusion  Airway Management Planned: Natural Airway  Additional Equipment:   Intra-op Plan:   Post-operative Plan:   Informed Consent: I have reviewed the patients History and Physical, chart, labs and discussed the procedure including the risks, benefits and alternatives for the proposed anesthesia with the patient or authorized representative who has indicated his/her understanding and acceptance.     Dental advisory given  Plan Discussed with: CRNA and Anesthesiologist  Anesthesia Plan Comments:         Anesthesia Quick  Evaluation  Anesthesia Physical Anesthesia Plan  ASA: III and emergent  Anesthesia Plan: General   Post-op Pain Management:    Induction:   PONV Risk Score and Plan: 3  Airway Management Planned: Oral ETT and LMA  Additional Equipment:   Intra-op Plan:   Post-operative Plan:   Informed Consent: I have reviewed the patients History and Physical, chart, labs and discussed the procedure including the risks, benefits and alternatives for the proposed anesthesia with the patient or authorized representative who has indicated his/her understanding and acceptance.       Plan Discussed with:   Anesthesia Plan Comments:         Anesthesia Quick Evaluation

## 2020-09-16 NOTE — Anesthesia Postprocedure Evaluation (Signed)
Anesthesia Post Note  Patient: [name]  Procedure(s) Performed: INTRAMEDULLARY (IM) NAIL INTERTROCHANTRIC (Left Hip)  Patient location during evaluation: PACU Anesthesia Type: General Level of consciousness: awake and alert Pain management: pain level controlled Vital Signs Assessment: post-procedure vital signs reviewed and stable Respiratory status: spontaneous breathing and respiratory function stable Cardiovascular status: stable Anesthetic complications: no   No complications documented.   Last Vitals:  Vitals:   09/16/20 1800 09/16/20 1815  BP: (!) 147/52 (!) 147/59  Pulse: 60 63  Resp: 17 15  Temp: 37.3 C   SpO2: 93% 94%    Last Pain:  Vitals:   09/16/20 1800  TempSrc:   PainSc: Asleep                 Shada Nienaber K

## 2020-09-16 NOTE — Anesthesia Procedure Notes (Signed)
Procedure Name: Intubation Performed by: Fredderick Phenix, CRNA Pre-anesthesia Checklist: Patient identified, Emergency Drugs available, Suction available and Patient being monitored Patient Re-evaluated:Patient Re-evaluated prior to induction Oxygen Delivery Method: Circle system utilized Preoxygenation: Pre-oxygenation with 100% oxygen Induction Type: IV induction Ventilation: Mask ventilation without difficulty Laryngoscope Size: Mac and 3 Grade View: Grade I Tube type: Oral Tube size: 7.0 mm Number of attempts: 1 Airway Equipment and Method: Stylet and Oral airway Placement Confirmation: ETT inserted through vocal cords under direct vision,  positive ETCO2 and breath sounds checked- equal and bilateral Secured at: 21 cm Tube secured with: Tape Dental Injury: Teeth and Oropharynx as per pre-operative assessment

## 2020-09-16 NOTE — Op Note (Signed)
DATE OF SURGERY:  09/16/2020  TIME: 5:18 PM  PATIENT NAME:  [name]  AGE: 87 y.o.  PRE-OPERATIVE DIAGNOSIS:  Left Intertrochanteric Hip Fracture  POST-OPERATIVE DIAGNOSIS:  SAME  PROCEDURE: Left INTRAMEDULLARY (IM) NAIL INTERTROCHANTRIC  SURGEON:  Renee Harder  EBL:  119 cc  COMPLICATIONS:  none  OPERATIVE IMPLANTS: [employer] Intertan femoral nail 10 mm x 185 mm  PREOPERATIVE INDICATIONS:  [name] is a 87 y.o. year old who fell and suffered a hip fracture. She was brought into the ER and then admitted and optimized and then elected for surgical intervention.    The risks benefits and alternatives were discussed with the patient including but not limited to the risks of nonoperative treatment, versus surgical intervention including infection, bleeding, nerve injury, malunion, nonunion, hardware prominence, hardware failure, need for hardware removal, blood clots, cardiopulmonary complications, morbidity, mortality, among others, and they were willing to proceed.    OPERATIVE PROCEDURE:  The patient was brought to the operating room and placed in the supine position.  General anesthesia was administered, with a foley. She was placed on the fracture table.  Closed reduction was performed under C-arm guidance. The length of the femur was also measured using fluoroscopy. Time out was then performed after sterile prep and drape. She received preoperative antibiotics.  Incision was made proximal to the greater trochanter. A guidewire was placed in the appropriate position. Confirmation was made on AP and lateral views. The above-named nail was opened. I opened the proximal femur with a reamer. I then placed the nail by hand easily down. I did not need to ream the femur.  Once the nail was completely seated, I placed a guidepin into the femoral head into the center center position through a second incision.  I measured the length, and then reamed the lateral  cortex and up into the head. I then placed the two interlocking lag screws. Slight compression was applied. Anatomic fixation achieved. Bone quality was poor.  I then secured the proximal interlocking screws.  I then removed the instruments, and took final C-arm pictures AP and lateral the entire length of the leg. Anatomic reconstruction was achieved, and the wounds were irrigated copiously and closed with Vicryl  followed by staples and dry sterile dressing. Sponge and needle count were correct.   The patient was awakened and returned to PACU in stable and satisfactory condition. There no complications and the patient tolerated the procedure well.   POSTOPERATIVE PLAN: 1. She will be weightbearing as tolerated.   2. Ok to re-start DVT ppx POD#1 (eliquis) 3. Dressing change by nursing staff as needed to keep dressing clean and dry 4. Outpatient f/u in clinic in 2 weeks for staple removal and xrays  Renee Harder

## 2020-09-16 NOTE — Transfer of Care (Signed)
Immediate Anesthesia Transfer of Care Note  Patient: [name]  Procedure(s) Performed: INTRAMEDULLARY (IM) NAIL INTERTROCHANTRIC (Left Hip)  Patient Location: PACU  Anesthesia Type:General  Level of Consciousness: sedated  Airway & Oxygen Therapy: Patient Spontanous Breathing and Patient connected to face mask oxygen  Post-op Assessment: Report given to RN and Post -op Vital signs reviewed and stable  Post vital signs: Reviewed and stable  Last Vitals:  Vitals Value Taken Time  BP 111/82 09/16/20 1730  Temp 37.4 C 09/16/20 1730  Pulse 63 09/16/20 1731  Resp 19 09/16/20 1732  SpO2 96 % 09/16/20 1731  Vitals shown include unvalidated device data.  Last Pain:  Vitals:   09/16/20 1730  TempSrc:   PainSc: 0-No pain         Complications: No complications documented.

## 2020-09-16 NOTE — Consult Note (Signed)
ORTHOPAEDIC CONSULTATION  REQUESTING PHYSICIAN: Bonnielee Haff, MD  Chief Complaint: Left intertrochanteric hip fracture  HPI: [name] is a 87 y.o. female who complains of left hip pain after mechanical fall at home yesterday.  X-rays were obtained which showed presence of a displaced left intertrochanteric hip fracture.  Patient was admitted to the hospitalist service and orthopedics was consulted.  Patient lives at home alone.  She takes Eliquis.  She ambulates with the use of a walker.  Past Medical History:  Diagnosis Date  . Arthritis   . Asthma   . Atrial fibrillation ([place])   . Cataract   . Eczema   . GERD (gastroesophageal reflux disease)   . Gout of wrist   . Heart murmur   . Pacemaker   . Pseudogout of ankle or foot   . Thyroid disease   . TIA (transient ischemic attack)    Past Surgical History:  Procedure Laterality Date  . ABLATION    . EYE SURGERY Bilateral    cataract  . foot sugery    . HIP ARTHROPLASTY     right  . IMPLANTABLE CARDIOVERTER DEFIBRILLATOR (ICD) GENERATOR CHANGE Left 05/29/2019   Procedure: PACEMAKER CHANGE OUT;  Surgeon: Isaias Cowman, MD;  Location: [facility] ORS;  Service: Cardiovascular;  Laterality: Left;  . PACEMAKER INSERTION     Social History   Socioeconomic History  . Marital status: Widowed    Spouse name: Not on file  . Number of children: Not on file  . Years of education: Not on file  . Highest education level: Not on file  Occupational History  . Not on file  Tobacco Use  . Smoking status: Never Smoker  . Smokeless tobacco: Never Used  Vaping Use  . Vaping Use: Never used  Substance and Sexual Activity  . Alcohol use: Not Currently    Alcohol/week: 0.0 standard drinks  . Drug use: No  . Sexual activity: Not on file  Other Topics Concern  . Not on file  Social History Narrative  . Not on file   Social Determinants of Health   Financial Resource Strain: Not on file  Food Insecurity: Not on  file  Transportation Needs: Not on file  Physical Activity: Not on file  Stress: Not on file  Social Connections: Not on file   Family History  Problem Relation Age of Onset  . Leukemia Father   . Lung cancer Sister   . Breast cancer Neg Hx    Allergies  Allergen Reactions  . Fluticasone-Salmeterol Anaphylaxis  . Albuterol     Other reaction(s): Other (See Comments) It burns and makes her cough "too strong for me or something"  . Ciprofloxacin Other (See Comments)    Other reaction(s): Unknown Does not remember  . Codeine Hives  . Colchicine Diarrhea  . Promethazine Other (See Comments)    Unable to speak. Loss muscle control (PHENGERAN)  . Valacyclovir     Other reaction(s): Muscle Pain Could hardly move  . Guaifenesin Palpitations  . Sulfa Antibiotics Rash   Prior to Admission medications   Medication Sig Start Date End Date Taking? Authorizing Provider  acetaminophen (TYLENOL) 650 MG CR tablet Take 650 mg by mouth every 8 (eight) hours as needed for pain.   Yes [provider]  apixaban (ELIQUIS) 5 MG TABS tablet Take 5 mg by mouth 2 (two) times daily.  06/26/13  Yes [provider]  atorvastatin (LIPITOR) 40 MG tablet Take 40 mg by mouth every  evening.    Yes [provider]  budesonide-formoterol (SYMBICORT) 160-4.5 MCG/ACT inhaler Inhale 2 puffs into the lungs 2 (two) times daily.  09/09/13  Yes [provider]  Cholecalciferol (VITAMIN D3) 50 MCG (2000 UT) TABS Take 2,000 Units by mouth daily.    Yes [provider]  clotrimazole (LOTRIMIN) 1 % cream Apply 1 application topically 2 (two) times daily. Apply to toes of R foot 07/07/20  Yes Cuthriell, Charline Bills, PA-C  diclofenac Sodium (VOLTAREN) 1 % GEL Apply 2 g topically daily as needed (pain).  08/22/13  Yes [provider]  famotidine (PEPCID) 20 MG tablet Take 20 mg by mouth 2 (two) times daily.    Yes [provider]  levothyroxine (SYNTHROID, LEVOTHROID)  112 MCG tablet Take 112 mcg by mouth daily before breakfast.   Yes [provider]  montelukast (SINGULAIR) 10 MG tablet Take 10 mg by mouth at bedtime.  12/27/13  Yes [provider]  Omega-3 1000 MG CAPS Take 1,000 mg by mouth daily.    Yes [provider]  Skin Protectants, Misc. (EUCERIN) cream Apply topically as needed for dry skin. Apply to tops of feet 07/07/20  Yes Cuthriell, Charline Bills, PA-C  triamcinolone (KENALOG) 0.1 % Apply 1 application topically 2 (two) times daily. 07/14/20  Yes Margarette Canada, NP  Olopatadine HCl (PAZEO) 0.7 % SOLN Apply 1 drop to eye at bedtime.     [provider]   DG Chest 1 View  Result Date: 09/15/2020 CLINICAL DATA:  87 year old female with fall and left hip pain. EXAM: CHEST  1 VIEW COMPARISON:  Chest radiograph dated 04/24/2019. FINDINGS: No focal consolidation, pleural effusion, or pneumothorax. Mild chronic interstitial coarsening and bronchitic changes. Top-normal cardiac size. Atherosclerotic calcification of the aorta. Left pectoral pacemaker device. No acute osseous pathology. IMPRESSION: No active cardiopulmonary disease. Electronically Signed   By: Anner Crete M.D.   On: 09/15/2020 22:32   DG HIP UNILAT WITH PELVIS 2-3 VIEWS LEFT  Result Date: 09/15/2020 CLINICAL DATA:  87 year old female with fall and left hip pain. EXAM: DG HIP (WITH OR WITHOUT PELVIS) 2-3V LEFT COMPARISON:  CT abdomen pelvis dated 07/25/2019. FINDINGS: There is a comminuted and displaced intertrochanteric fracture of the left femoral neck. There is no dislocation. The bones are osteopenic. There is a total right hip arthroplasty. The soft tissues are unremarkable. IMPRESSION: Comminuted and displaced intertrochanteric fracture of the left femoral neck. Electronically Signed   By: Anner Crete M.D.   On: 09/15/2020 22:31    Positive ROS: All other systems have been reviewed and were otherwise negative with the exception of those mentioned in  the HPI and as above.  Physical Exam: General: Alert, no acute distress Cardiovascular: No pedal edema Respiratory: No cyanosis, no use of accessory musculature GI: No organomegaly, abdomen is soft and non-tender Skin: No lesions in the area of chief complaint Neurologic: Sensation intact distally Psychiatric: Patient is competent for consent with normal mood and affect Lymphatic: No axillary or cervical lymphadenopathy  MUSCULOSKELETAL:  Lower extremity: Tenderness to palpation about the left hip, range of motion deferred, no notable tenderness at the knee or ankle, left lower extremities grossly neurovascular intact  Assessment: 87 year old female admitted with a displaced left intertrochanteric hip fracture  Plan: We had a long discussion regarding risk and benefits of operative and nonoperative treatment options.  Recommendation was made for left hip IM nail.  Patient's questions were answered appropriately.  We will plan for surgery later this afternoon/evening.  Please keep NPO.    Renee Harder, MD    09/16/2020 7:58 AM

## 2020-09-16 NOTE — Plan of Care (Signed)

## 2020-09-17 ENCOUNTER — Encounter: Payer: Self-pay | Admitting: Orthopaedic Surgery

## 2020-09-17 DIAGNOSIS — Z950 Presence of cardiac pacemaker: Secondary | ICD-10-CM | POA: Diagnosis not present

## 2020-09-17 DIAGNOSIS — I480 Paroxysmal atrial fibrillation: Secondary | ICD-10-CM | POA: Diagnosis not present

## 2020-09-17 DIAGNOSIS — S72145A Nondisplaced intertrochanteric fracture of left femur, initial encounter for closed fracture: Secondary | ICD-10-CM | POA: Diagnosis not present

## 2020-09-17 LAB — CBC
HCT: 29.9 % — ABNORMAL LOW (ref 36.0–46.0)
Hemoglobin: 9.4 g/dL — ABNORMAL LOW (ref 12.0–15.0)
MCH: 27.2 pg (ref 26.0–34.0)
MCHC: 31.4 g/dL (ref 30.0–36.0)
MCV: 86.4 fL (ref 80.0–100.0)
Platelets: 211 10*3/uL (ref 150–400)
RBC: 3.46 MIL/uL — ABNORMAL LOW (ref 3.87–5.11)
RDW: 14.5 % (ref 11.5–15.5)
WBC: 15.2 10*3/uL — ABNORMAL HIGH (ref 4.0–10.5)
nRBC: 0 % (ref 0.0–0.2)

## 2020-09-17 LAB — BASIC METABOLIC PANEL
Anion gap: 9 (ref 5–15)
BUN: 28 mg/dL — ABNORMAL HIGH (ref 8–23)
CO2: 23 mmol/L (ref 22–32)
Calcium: 8.3 mg/dL — ABNORMAL LOW (ref 8.9–10.3)
Chloride: 107 mmol/L (ref 98–111)
Creatinine, Ser: 0.75 mg/dL (ref 0.44–1.00)
GFR, Estimated: >60 mL/min (ref >60)
Glucose, Bld: 159 mg/dL — ABNORMAL HIGH (ref 70–99)
Potassium: 4.1 mmol/L (ref 3.5–5.1)
Sodium: 139 mmol/L (ref 135–145)

## 2020-09-17 MED ORDER — APIXABAN 5 MG PO TABS
5.0000 mg | ORAL_TABLET | Freq: Two times a day (BID) | ORAL | Status: DC
  Administered 2020-09-17 – 2020-09-19 (×4): 5 mg via ORAL
  Filled 2020-09-17 (×4): qty 1

## 2020-09-17 MED ORDER — HALOPERIDOL LACTATE 5 MG/ML IJ SOLN: 1.0000 mg | Freq: Four times a day (QID) | INTRAMUSCULAR | Status: DC | PRN

## 2020-09-17 NOTE — Progress Notes (Signed)
Physical Therapy Treatment Patient Details Name: [name] MRN: [id number] DOB: [birth year] Today's Date: 09/17/2020    History of Present Illness Patient is a 87 y.o. Caucasian female with medical history significant for asthma, osteoarthritis, atrial fibrillation, eczema, GERD, TIA and hypothyroidism, pacemaker placement who presented to the ER with acute onset of left hip pain after having an accidental mechanical fall.  Patient was found to have a left hip fracture. Status post IM nial of left hip    PT Comments    Patient seen for afternoon PT session. Patient is hopeful that she can just go home, however she is unsafe at this time to go home alone. Patient continues to require assistance with transfers and standing tolerance limited by pain and fatigue with activity. Home exercise program handout issued and patient able to participate with LE exercises stated below. Recommend to continue PT to maximize independence and facilitate return to prior level of function.     Follow Up Recommendations  SNF     Equipment Recommendations   (to be determined at next level of care)    Recommendations for Other Services       Precautions / Restrictions Precautions Precautions: Fall Precaution Booklet Issued: Yes (comment) (hip fracture repair home exercise handout provided) Restrictions Weight Bearing Restrictions: Yes LLE Weight Bearing: Weight bearing as tolerated    Mobility  Bed Mobility Overal bed mobility: Needs Assistance Bed Mobility: Supine to Sit;Sit to Supine     Supine to sit: Mod assist     General bed mobility comments: not addressed this session    Transfers Overall transfer level: Needs assistance Equipment used: Rolling walker (2 wheeled) Transfers: Sit to/from Stand Sit to Stand: Mod assist         General transfer comment: lifting and lowering assistance provided. verbal cues for hand placement and technique to facilitate independence with  standing  Ambulation/Gait Ambulation/Gait assistance: Min assist;Mod assist Gait Distance (Feet): 3 Feet (x 2 bouts) Assistive device: Rolling walker (2 wheeled) Gait Pattern/deviations: Antalgic;Decreased step length - left;Decreased step length - right Gait velocity: decreased   General Gait Details: not addressed due to limited standing activity tolerance due to fatigue and pain in left leg with activity   Stairs             Wheelchair Mobility    Modified Rankin (Stroke Patients Only)       Balance Overall balance assessment: Needs assistance Sitting-balance support: No upper extremity supported;Feet supported Sitting balance-Leahy Scale: Fair     Standing balance support: Bilateral upper extremity supported Standing balance-Leahy Scale: Poor                              Cognition Arousal/Alertness: Awake/alert Behavior During Therapy:  (mild paranoia. patient taking about her money and worried that people here think she has "a lot of money") Overall Cognitive Status: Impaired/Different from baseline Area of Impairment: Orientation;Following commands;Memory                 Orientation Level: Disoriented to;Situation;Place;Time (reports she is in a resturant, thinks the day of the week is Wednesday) Current Attention Level: Selective Memory: Decreased recall of precautions;Decreased short-term memory Following Commands: Follows one step commands inconsistently Safety/Judgement: Decreased awareness of safety;Decreased awareness of deficits   Problem Solving: Decreased initiation;Difficulty sequencing;Slow processing;Requires verbal cues;Requires tactile cues General Comments: patient able to follow single step commands consistently      Exercises General Exercises -  Lower Extremity Ankle Circles/Pumps: AROM;Strengthening;10 reps;Seated;Both Quad Sets: AROM;Strengthening;10 reps;Seated;Both Gluteal Sets: AROM;Strengthening;Both;10  reps;Seated Long Arc Quad: AAROM;Strengthening;Left;10 reps;Seated Heel Slides: AAROM;Strengthening;Left;10 reps;Seated Hip ABduction/ADduction: AAROM;Strengthening;Left;10 reps;Seated Straight Leg Raises: AAROM;Strengthening;Left;10 reps;Seated Other Exercises Other Exercises: verbal and visual cues for exercise technique for strengthening Other Exercises: LBD, sit<>stand, sitting/standing balance/tolerance, ~5 ft mobility, meal ordering    General Comments        Pertinent Vitals/Pain Pain Assessment: Faces Pain Score: 4  Faces Pain Scale: Hurts a little bit Pain Location: left hip Pain Descriptors / Indicators: Discomfort Pain Intervention(s): Limited activity within patient's tolerance    Home Living Family/patient expects to be discharged to:: Private residence Living Arrangements: Alone Available Help at Discharge: Family;Available PRN/intermittently Type of Home: House Home Access: Stairs to enter Entrance Stairs-Rails: None Home Layout: One level;Laundry or work area in [place]: Environmental consultant - 2 wheels      Prior Function Level of Independence: Independent with assistive device(s)      Comments: ambulation using rolling walker. patient reports she drives, however question accuracy of this information   PT Goals (current goals can now be found in the care plan section) Acute Rehab PT Goals Patient Stated Goal: to go home at night PT Goal Formulation: With patient Time For Goal Achievement: 10/01/20 Potential to Achieve Goals: Fair Progress towards PT goals: Progressing toward goals    Frequency    7X/week      PT Plan Frequency needs to be updated    Co-evaluation              AM-PAC PT "6 Clicks" Mobility   Outcome Measure  Help needed turning from your back to your side while in a flat bed without using bedrails?: A Little Help needed moving from lying on your back to sitting on the side of a flat bed without using bedrails?: A  Little Help needed moving to and from a bed to a chair (including a wheelchair)?: A Lot Help needed standing up from a chair using your arms (e.g., wheelchair or bedside chair)?: A Lot Help needed to walk in hospital room?: A Little Help needed climbing 3-5 steps with a railing? : Total 6 Click Score: 14    End of Session Equipment Utilized During Treatment: Gait belt Activity Tolerance: Patient tolerated treatment well;Patient limited by fatigue Patient left: in chair;with call bell/phone within reach;with SCD's reapplied;with chair alarm set (ice pack filled and applied to left hip area) Nurse Communication: Mobility status PT Visit Diagnosis: Unsteadiness on feet (R26.81);Muscle weakness (generalized) (M62.81);Difficulty in walking, not elsewhere classified (R26.2)     Time: 1340-1407 PT Time Calculation (min) (ACUTE ONLY): 27 min  Charges:   $Therapeutic Exercise: 8-22 mins $Therapeutic Activity: 8-22 mins                    Minna Merritts, PT, MPT    Percell Locus 09/17/2020, 2:16 PM

## 2020-09-17 NOTE — Progress Notes (Signed)
[facility] PROGRESS NOTE   [name] SHF:[id number] DOB: [birth year] DOA: 09/15/2020  PCP: Ezequiel Kayser, MD  Brief History/Interval Summary: 87 y.o. Caucasian female with medical history significant for asthma, osteoarthritis, atrial fibrillation, eczema, GERD, TIA and hypothyroidism, who presented to the ER with acute onset of left hip pain after having an accidental mechanical fall.    Patient was found to have a left hip fracture.  She was hospitalized for further management.    Reason for Visit: Left hip fracture  Consultants: Orthopedic  Procedures: Left intramedullary nail intertrochanteric on 5/25  Antibiotics: Anti-infectives (From admission, onward)   Start     Dose/Rate Route Frequency Ordered Stop   09/17/20 0000  ceFAZolin (ANCEF) IVPB 1 g/50 mL premix  Status:  Discontinued        1 g 100 mL/hr over 30 Minutes Intravenous Every 8 hours 09/16/20 1840 09/16/20 2007   09/16/20 2300  ceFAZolin (ANCEF) 1 g in sodium chloride 0.9 % 100 mL IVPB        1 g 200 mL/hr over 30 Minutes Intravenous Every 8 hours 09/16/20 2007 09/17/20 2259   09/16/20 1500  ceFAZolin (ANCEF) powder 1 g  Status:  Discontinued        1 g Other To Surgery 09/16/20 0758 09/16/20 0759   09/16/20 1500  ceFAZolin (ANCEF) 1 g in sodium chloride 0.9 % 100 mL IVPB        1 g 200 mL/hr over 30 Minutes Intravenous On call to O.R. 09/16/20 0801 09/16/20 1611   09/16/20 1446  sodium chloride 0.9 % with ceFAZolin (ANCEF) ADS Med       Note to Pharmacy: Norton Blizzard  : cabinet override      09/16/20 1446 09/16/20 1612      Subjective/Interval History: Does not appear to be in any significant pain.  Seems to be slightly distracted.  No chest pain or shortness of breath reported.    Assessment/Plan:  Left hip fracture secondary to mechanical fall Patient seen by orthopedics and underwent ORIF yesterday.  Pain control.  PT and OT evaluation.  Should be able to resume Eliquis tonight.    May need to go to skilled nursing facility for short-term rehab.  History of pacemaker placement/paroxysmal atrial fibrillation Pacemaker was placed in 2015 for SA node dysfunction with recent generator change in 2021.  Last seen by cardiology in February 2022.  Cardiac status was noted to be stable at that time. Atrial fibrillation is noted to be stable.  Eliquis was placed on hold due to need for surgery.  Should be able to resume it tonight.  Normocytic anemia/postoperative anemia Drop in hemoglobin likely due to surgery along with dilutional effects.  No overt bleeding noted.  No bruising noted over the surgical site.  Recheck hemoglobin tomorrow.  Transfuse for hemoglobin less than 7.  Leukocytosis Probably reactive.  Noted to be afebrile.  No obvious source of infection.  Continue to monitor for now.  History of asthma Continue inhalers.  Stable  Dyslipidemia Continue statin.  Hypothyroidism Continue levothyroxine.   DVT Prophylaxis: Continue SCDs.  Resume Eliquis from tonight. Code Status: Full code Family Communication: No family at bedside Disposition Plan: Unclear for now.  May need to go to skilled nursing facility for rehab  Status is: Inpatient  Remains inpatient appropriate because:Ongoing active pain requiring inpatient pain management, Unsafe d/c plan and Inpatient level of care appropriate due to severity of illness   Dispo: The patient is from: Home  Anticipated d/c is to: To be determined              Patient currently is not medically stable to d/c.   Difficult to place patient No     Medications:  Scheduled: . atorvastatin  40 mg Oral QPM  .  ceFAZolin (ANCEF) IV  1 g Intravenous Q8H  . cholecalciferol  2,000 Units Oral Daily  . famotidine  20 mg Oral BID  . levothyroxine  112 mcg Oral Q0600  . mometasone-formoterol  2 puff Inhalation BID  . montelukast  10 mg Oral QHS  . olopatadine  1 drop Both Eyes QHS  . omega-3 acid ethyl  esters  1,000 mg Oral Daily   Continuous: . sodium chloride 100 mL/hr at 09/16/20 1536   TMA:[id number] **OR** acetaminophen, hydrocerin, HYDROmorphone (DILAUDID) injection, magnesium hydroxide, ondansetron **OR** ondansetron (ZOFRAN) IV, traMADol, traZODone   Objective:  Vital Signs  Vitals:   09/16/20 2338 09/17/20 0053 09/17/20 0414 09/17/20 0815  BP: 118/68 (!) 126/58 110/68 139/70  Pulse: 60 63 70 60  Resp: 16 18 18 18   Temp: 98.1 F (36.7 C) 97.8 F (36.6 C) 97.6 F (36.4 C) 98 F (36.7 C)  TempSrc:  Oral Oral Oral  SpO2: 97% 97% 96% 100%  Weight:      Height:        Intake/Output Summary (Last 24 hours) at 09/17/2020 0937 Last data filed at 09/17/2020 0414 Gross per 24 hour  Intake 600 ml  Output 1025 ml  Net -425 ml   Filed Weights   09/15/20 2112 09/16/20 0011  Weight: 61.2 kg 66.7 kg    General appearance: Awake alert.  In no distress.  Distracted Resp: Clear to auscultation bilaterally.  Normal effort Cardio: S1-S2 is normal regular.  No S3-S4.  No rubs murmurs or bruit GI: Abdomen is soft.  Nontender nondistended.  Bowel sounds are present normal.  No masses organomegaly Extremities: Slight swelling noted over the left thigh area. Neurologic:  No focal neurological deficits.     Lab Results:  Data Reviewed: I have personally reviewed following labs and imaging studies  CBC: Recent Labs  Lab 09/15/20 2117 09/16/20 0546 09/17/20 0422  WBC 13.7* 17.8* 15.2*  NEUTROABS 10.2*  --   --   HGB 12.9 11.4* 9.4*  HCT 39.9 34.4* 29.9*  MCV 85.4 84.1 86.4  PLT 249 216 893    Basic Metabolic Panel: Recent Labs  Lab 09/15/20 2117 09/16/20 0546 09/17/20 0422  NA 136 138 139  K 4.0 4.5 4.1  CL 101 104 107  CO2 26 27 23   GLUCOSE 149* 137* 159*  BUN 30* 30* 28*  CREATININE 0.57 0.64 0.75  CALCIUM 8.5* 8.3* 8.3*    GFR: Estimated Creatinine Clearance: 41.3 mL/min (by C-G formula based on SCr of 0.75 mg/dL).    Recent Results (from the  past 240 hour(s))  Resp Panel by RT-PCR (Flu A&B, Covid) Nasopharyngeal Swab     Status: None   Collection Time: 09/15/20 10:39 PM   Specimen: Nasopharyngeal Swab; Nasopharyngeal(NP) swabs in vial transport medium  Result Value Ref Range Status   SARS Coronavirus 2 by RT PCR NEGATIVE NEGATIVE Final    Comment: (NOTE) SARS-CoV-2 target nucleic acids are NOT DETECTED.  The SARS-CoV-2 RNA is generally detectable in upper respiratory specimens during the acute phase of infection. The lowest concentration of SARS-CoV-2 viral copies this assay can detect is 138 copies/mL. A negative result does not preclude SARS-Cov-2 infection and should not be  used as the sole basis for treatment or other patient management decisions. A negative result may occur with  improper specimen collection/handling, submission of specimen other than nasopharyngeal swab, presence of viral mutation(s) within the areas targeted by this assay, and inadequate number of viral copies(<138 copies/mL). A negative result must be combined with clinical observations, patient history, and epidemiological information. The expected result is Negative.  Fact Sheet for Patients:  EntrepreneurPulse.com.au  Fact Sheet for Healthcare Providers:  IncredibleEmployment.be  This test is no t yet approved or cleared by the [place] FDA and  has been authorized for detection and/or diagnosis of SARS-CoV-2 by FDA under an Emergency Use Authorization (EUA). This EUA will remain  in effect (meaning this test can be used) for the duration of the COVID-19 declaration under Section 564(b)(1) of the Act, 21 U.S.C.section 360bbb-3(b)(1), unless the authorization is terminated  or revoked sooner.       Influenza A by PCR NEGATIVE NEGATIVE Final   Influenza B by PCR NEGATIVE NEGATIVE Final    Comment: (NOTE) The Xpert Xpress SARS-CoV-2/FLU/RSV plus assay is intended as an aid in the diagnosis of  influenza from Nasopharyngeal swab specimens and should not be used as a sole basis for treatment. Nasal washings and aspirates are unacceptable for Xpert Xpress SARS-CoV-2/FLU/RSV testing.  Fact Sheet for Patients: EntrepreneurPulse.com.au  Fact Sheet for Healthcare Providers: IncredibleEmployment.be  This test is not yet approved or cleared by the [place] FDA and has been authorized for detection and/or diagnosis of SARS-CoV-2 by FDA under an Emergency Use Authorization (EUA). This EUA will remain in effect (meaning this test can be used) for the duration of the COVID-19 declaration under Section 564(b)(1) of the Act, 21 U.S.C. section 360bbb-3(b)(1), unless the authorization is terminated or revoked.  Performed at [facility], [address], [place], [place] [address]   Surgical PCR screen     Status: None   Collection Time: 09/16/20  4:23 AM   Specimen: Nasal Mucosa; Nasal Swab  Result Value Ref Range Status   MRSA, PCR NEGATIVE NEGATIVE Final   Staphylococcus aureus NEGATIVE NEGATIVE Final    Comment: (NOTE) The Xpert SA Assay (FDA approved for NASAL specimens in patients 81 years of age and older), is one component of a comprehensive surveillance program. It is not intended to diagnose infection nor to guide or monitor treatment. Performed at [facility], [address], [place], [place] [address]       Radiology Studies: DG Chest 1 View  Result Date: 09/15/2020 CLINICAL DATA:  87 year old female with fall and left hip pain. EXAM: CHEST  1 VIEW COMPARISON:  Chest radiograph dated 04/24/2019. FINDINGS: No focal consolidation, pleural effusion, or pneumothorax. Mild chronic interstitial coarsening and bronchitic changes. Top-normal cardiac size. Atherosclerotic calcification of the aorta. Left pectoral pacemaker device. No acute osseous pathology. IMPRESSION: No active cardiopulmonary disease. Electronically  Signed   By: Anner Crete M.D.   On: 09/15/2020 22:32   DG HIP OPERATIVE UNILAT W OR W/O PELVIS LEFT  Result Date: 09/16/2020 CLINICAL DATA:  Left femur IM nail. EXAM: OPERATIVE LEFT HIP (WITH PELVIS IF PERFORMED) TECHNIQUE: Fluoroscopic spot image(s) were submitted for interpretation post-operatively. COMPARISON:  Preoperative radiograph yesterday. FINDINGS: Four fluoroscopic spot views of the left hip obtained in the operating room. Intramedullary nail with trans trochanteric and distal locking screw fixation traverse comminuted intertrochanteric femur fracture. Total fluoroscopy time 1 minutes 11 seconds. Total dose 15.33 mGy. IMPRESSION: Procedural fluoroscopy during ORIF intertrochanteric left femur fracture.  Electronically Signed   By: Keith Rake M.D.   On: 09/16/2020 17:53   DG HIP UNILAT WITH PELVIS 2-3 VIEWS LEFT  Result Date: 09/15/2020 CLINICAL DATA:  87 year old female with fall and left hip pain. EXAM: DG HIP (WITH OR WITHOUT PELVIS) 2-3V LEFT COMPARISON:  CT abdomen pelvis dated 07/25/2019. FINDINGS: There is a comminuted and displaced intertrochanteric fracture of the left femoral neck. There is no dislocation. The bones are osteopenic. There is a total right hip arthroplasty. The soft tissues are unremarkable. IMPRESSION: Comminuted and displaced intertrochanteric fracture of the left femoral neck. Electronically Signed   By: Anner Crete M.D.   On: 09/15/2020 22:31       LOS: 2 days   [facility] Pager on [email or web address]  09/17/2020, 9:37 AM

## 2020-09-17 NOTE — Progress Notes (Signed)
Subjective:  Patient reports pain as well controlled. No complaints.  Objective:   VITALS:   Vitals:   09/16/20 2338 09/17/20 0053 09/17/20 0414 09/17/20 0815  BP: 118/68 (!) 126/58 110/68 139/70  Pulse: 60 63 70 60  Resp: 16 18 18 18   Temp: 98.1 F (36.7 C) 97.8 F (36.6 C) 97.6 F (36.4 C) 98 F (36.7 C)  TempSrc:  Oral Oral Oral  SpO2: 97% 97% 96% 100%  Weight:      Height:        PHYSICAL EXAM:  General: sitting in bedside chair LLE: dressings c/d/i, LLE is NVI  LABS  Results for orders placed or performed during the hospital encounter of 09/15/20 (from the past 24 hour(s))  Type and screen [name]     Status: None   Collection Time: 09/16/20  2:57 PM  Result Value Ref Range   ABO/RH(D) O POS    Antibody Screen NEG    Sample Expiration      09/19/2020,2359 Performed at [facility], [address] [place], [place] [address]   CBC     Status: Abnormal   Collection Time: 09/17/20  4:22 AM  Result Value Ref Range   WBC 15.2 (H) 4.0 - 10.5 K/uL   RBC 3.46 (L) 3.87 - 5.11 MIL/uL   Hemoglobin 9.4 (L) 12.0 - 15.0 g/dL   HCT 29.9 (L) 36.0 - 46.0 %   MCV 86.4 80.0 - 100.0 fL   MCH 27.2 26.0 - 34.0 pg   MCHC 31.4 30.0 - 36.0 g/dL   RDW 14.5 11.5 - 15.5 %   Platelets 211 150 - 400 K/uL   nRBC 0.0 0.0 - 0.2 %  Basic metabolic panel     Status: Abnormal   Collection Time: 09/17/20  4:22 AM  Result Value Ref Range   Sodium 139 135 - 145 mmol/L   Potassium 4.1 3.5 - 5.1 mmol/L   Chloride 107 98 - 111 mmol/L   CO2 23 22 - 32 mmol/L   Glucose, Bld 159 (H) 70 - 99 mg/dL   BUN 28 (H) 8 - 23 mg/dL   Creatinine, Ser 0.75 0.44 - 1.00 mg/dL   Calcium 8.3 (L) 8.9 - 10.3 mg/dL   GFR, Estimated >60 >60 mL/min   Anion gap 9 5 - 15    DG Chest 1 View  Result Date: 09/15/2020 CLINICAL DATA:  87 year old female with fall and left hip pain. EXAM: CHEST  1 VIEW COMPARISON:  Chest radiograph dated 04/24/2019. FINDINGS: No focal  consolidation, pleural effusion, or pneumothorax. Mild chronic interstitial coarsening and bronchitic changes. Top-normal cardiac size. Atherosclerotic calcification of the aorta. Left pectoral pacemaker device. No acute osseous pathology. IMPRESSION: No active cardiopulmonary disease. Electronically Signed   By: Anner Crete M.D.   On: 09/15/2020 22:32   DG HIP OPERATIVE UNILAT W OR W/O PELVIS LEFT  Result Date: 09/16/2020 CLINICAL DATA:  Left femur IM nail. EXAM: OPERATIVE LEFT HIP (WITH PELVIS IF PERFORMED) TECHNIQUE: Fluoroscopic spot image(s) were submitted for interpretation post-operatively. COMPARISON:  Preoperative radiograph yesterday. FINDINGS: Four fluoroscopic spot views of the left hip obtained in the operating room. Intramedullary nail with trans trochanteric and distal locking screw fixation traverse comminuted intertrochanteric femur fracture. Total fluoroscopy time 1 minutes 11 seconds. Total dose 15.33 mGy. IMPRESSION: Procedural fluoroscopy during ORIF intertrochanteric left femur fracture. Electronically Signed   By: Keith Rake M.D.   On: 09/16/2020 17:53   DG HIP UNILAT WITH PELVIS 2-3 VIEWS  LEFT  Result Date: 09/15/2020 CLINICAL DATA:  87 year old female with fall and left hip pain. EXAM: DG HIP (WITH OR WITHOUT PELVIS) 2-3V LEFT COMPARISON:  CT abdomen pelvis dated 07/25/2019. FINDINGS: There is a comminuted and displaced intertrochanteric fracture of the left femoral neck. There is no dislocation. The bones are osteopenic. There is a total right hip arthroplasty. The soft tissues are unremarkable. IMPRESSION: Comminuted and displaced intertrochanteric fracture of the left femoral neck. Electronically Signed   By: Anner Crete M.D.   On: 09/15/2020 22:31    Assessment/Plan: POD#1 s/p L hip IMN, doing well - PT/OT: WBAT LLE - Ok to restart Eliquis today - Dressing change POD#2 by nursing staff to keep dressing clean and dry - Dispo per PT and case manager -  Follow-up in clinic in 2 weeks for staple removal and xrays   Renee Harder , MD 09/17/2020, 12:46 PM

## 2020-09-17 NOTE — NC FL2 (Signed)
[place] LEVEL OF CARE SCREENING TOOL     IDENTIFICATION  Patient Name: [name] Birthdate: [birth year] Sex: female Admission Date (Current Location): 09/15/2020  [place] and [place] Number:  Engineering geologist and Address:  [facility], [address] [place], [place] [address]      Provider Number: [id number]  Attending Physician Name and Address:  Bonnielee Haff, MD  Relative Name and Phone Number:  Letitia Caul [phone]    Current Level of Care: Hospital Recommended Level of Care: [place] Prior Approval Number:    Date Approved/Denied:   PASRR Number: [id number]  Discharge Plan: SNF    Current Diagnoses: Patient Active Problem List   Diagnosis Date Noted  . Closed left hip fracture ([place]) 09/15/2020  . Acute appendicitis 07/25/2019  . Leukocytosis 06/29/2016  . Hypogammaglobulinemia ([place]) 06/29/2016  . Temporary cerebral vascular dysfunction 02/28/2014  . DD (diverticular disease) 02/28/2014  . Allergic rhinitis 02/28/2014  . Allergy status to sulfonamides 02/28/2014  . History of anticoagulant therapy 02/28/2014  . Arthritis 02/28/2014  . Airway hyperreactivity 02/28/2014  . A-fib ([place]) 02/28/2014  . Bilateral cataracts 02/28/2014  . Dermatitis, eczematoid 02/28/2014  . Allergy to environmental factors 02/28/2014  . Acid reflux 02/28/2014  . Polypharmacy 02/28/2014  . HLD (hyperlipidemia) 02/28/2014  . Adult hypothyroidism 02/28/2014  . BP (high blood pressure) 02/28/2014  . Arthritis, degenerative 02/28/2014  . Degenerative arthritis of hip 10/09/2013  . 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 Neck rigid 05/30/2013  . 1st degree AV block 10/10/2012  . History of biliary T-tube placement 06/14/2012  . History of cardioversion 06/05/2012  . Avascular necrosis of femoral head ([place]) 06/01/2012  . Lumbar radiculopathy 06/01/2012  . DDD (degenerative disc  disease), lumbar 06/01/2012  . H/O cardiac catheterization 10/20/2009    Orientation RESPIRATION BLADDER Height & Weight     Self,Time,Situation,Place  Normal Continent Weight: 66.7 kg Height:  5\' 4"  (162.6 cm)  BEHAVIORAL SYMPTOMS/MOOD NEUROLOGICAL BOWEL NUTRITION STATUS      Continent Diet (regular)  AMBULATORY STATUS COMMUNICATION OF NEEDS Skin   Extensive Assist Verbally Surgical wounds                       Personal Care Assistance Level of Assistance  Bathing,Dressing Bathing Assistance: Limited assistance   Dressing Assistance: Limited assistance     Functional Limitations Info  Sight,Hearing,Speech Sight Info: Adequate Hearing Info: Adequate Speech Info: Adequate    SPECIAL CARE FACTORS FREQUENCY  PT (By licensed PT)     PT Frequency: 5 days per week              Contractures Contractures Info: Not present    Additional Factors Info  Code Status,Allergies Code Status Info: full code Allergies Info: Fluticasone-salmeterol, Albuterol, Ciprofloxacin, Codeine, Colchicine, Promethazine, Valacyclovir, Guaifenesin, Sulfa Antibiotics           Current Medications (09/17/2020):  This is the current hospital active medication list Current Facility-Administered Medications  Medication Dose Route Frequency Provider Last Rate Last Admin  . 0.9 %  sodium chloride infusion   Intravenous Continuous Bonnielee Haff, MD 10 mL/hr at 09/17/20 1042 Rate Change at 09/17/20 1042  . acetaminophen (TYLENOL) tablet 650 mg  650 mg Oral Q6H PRN Renee Harder, MD   650 mg at 09/16/20 2105   Or  . acetaminophen (TYLENOL) suppository 650 mg  650 mg Rectal Q6H PRN Renee Harder, MD      .  apixaban (ELIQUIS) tablet 5 mg  5 mg Oral BID Bonnielee Haff, MD      . atorvastatin (LIPITOR) tablet 40 mg  40 mg Oral QPM Renee Harder, MD   40 mg at 09/16/20 2104  . ceFAZolin (ANCEF) 1 g in sodium chloride 0.9 % 100 mL IVPB  1 g Intravenous Q8H Bonnielee Haff, MD   1 g at  09/17/20 0640  . cholecalciferol (VITAMIN D3) tablet 2,000 Units  2,000 Units Oral Daily Renee Harder, MD   2,000 Units at 09/17/20 0910  . famotidine (PEPCID) tablet 20 mg  20 mg Oral BID Renee Harder, MD   20 mg at 09/17/20 0910  . hydrocerin (EUCERIN) cream 1 application  1 application Topical PRN Renee Harder, MD      . HYDROmorphone (DILAUDID) injection 0.5 mg  0.5 mg Intravenous Q4H PRN Renee Harder, MD   0.5 mg at 09/16/20 1207  . levothyroxine (SYNTHROID) tablet 112 mcg  112 mcg Oral Q0600 Renee Harder, MD   112 mcg at 09/17/20 [phone]  . magnesium hydroxide (MILK OF MAGNESIA) suspension 30 mL  30 mL Oral Daily PRN Renee Harder, MD      . mometasone-formoterol [facility]) 200-5 MCG/ACT inhaler 2 puff  2 puff Inhalation BID Renee Harder, MD   2 puff at 09/17/20 0910  . montelukast (SINGULAIR) tablet 10 mg  10 mg Oral Dewain Penning, MD   10 mg at 09/16/20 2106  . olopatadine (PATANOL) 0.1 % ophthalmic solution 1 drop  1 drop Both Eyes QHS Renee Harder, MD   1 drop at 09/16/20 2118  . omega-3 acid ethyl esters (LOVAZA) capsule 1,000 mg  1,000 mg Oral Daily Renee Harder, MD   1,000 mg at 09/17/20 0910  . ondansetron (ZOFRAN) tablet 4 mg  4 mg Oral Q6H PRN Renee Harder, MD       Or  . ondansetron [facility]) injection 4 mg  4 mg Intravenous Q6H PRN Renee Harder, MD      . traMADol Veatrice [facility]bon) tablet 50 mg  50 mg Oral Q6H PRN Renee Harder, MD   50 mg at 09/17/20 0447  . traZODone (DESYREL) tablet 25 mg  25 mg Oral QHS PRN Renee Harder, MD         Discharge Medications: Please see discharge summary for a list of discharge medications.  Relevant Imaging Results:  Relevant Lab Results:   Additional Information SS# [id number]  Su Hilt, RN

## 2020-09-17 NOTE — Evaluation (Signed)
Occupational Therapy Evaluation Patient Details Name: [name] MRN: [id number] DOB: 1924-09-05 Today's Date: 09/17/2020    History of Present Illness [name] is a 87 y.o. Caucasian female with medical history significant for asthma, osteoarthritis, atrial fibrillation, eczema, GERD, TIA and hypothyroidism, who presented to the ER with acute onset of left hip pain after having an accidental mechanical fall. S/P L IM nail 09/16/20   Clinical Impression   Ms Goh was seen for OT evaluation this date. Prior to hospital admission, pt was MOD I for mobility and ADLs including caring for her 2 small dogs. Nephew provides transportation to appointments/groceries. Pt lives alone in home c 1 STE. Pt presents to acute OT demonstrating impaired ADL performance and functional mobility 2/2 decreased activity tolerance, functional strength/ROM/balance deficits, and poor insight into deficits. Pt oriented to self only, repeatedly states she is ready to go home and does not retain when reminded she is staying in hospital. Pt recognizes her nephew but unable to state her relationship to him (states he is her brother in law repeatedly).  Pt currently requires  CGA doff R sock seated EOC, MAX A don B socks. MIN A + RW for ADL t/f. MIN A + MOD VCs to order meal. Pt would benefit from skilled OT to address noted impairments and functional limitations (see below for any additional details) in order to maximize safety and independence while minimizing falls risk and caregiver burden. Upon hospital discharge, recommend STR to maximize pt safety and return to PLOF.     Follow Up Recommendations  SNF;Supervision/Assistance - 24 hour    Equipment Recommendations  3 in 1 bedside commode    Recommendations for Other Services       Precautions / Restrictions Precautions Precautions: Fall Restrictions Weight Bearing Restrictions: Yes LLE Weight Bearing: Weight bearing as tolerated       Mobility Bed Mobility               General bed mobility comments: pt received and left in chair    Transfers Overall transfer level: Needs assistance Equipment used: Rolling walker (2 wheeled) Transfers: Sit to/from Stand Sit to Stand: Min assist         General transfer comment: assist to stabilize RW    Balance Overall balance assessment: Needs assistance Sitting-balance support: No upper extremity supported;Feet supported Sitting balance-Leahy Scale: Fair     Standing balance support: Bilateral upper extremity supported Standing balance-Leahy Scale: Poor                             ADL either performed or assessed with clinical judgement   ADL Overall ADL's : Needs assistance/impaired                                       General ADL Comments: CGA doff R sock seated EOC, MAX A don B socks. MIN A + RW for ADL t/f. MIN A + MOD VCs to order meal                  Pertinent Vitals/Pain Pain Assessment: Faces Faces Pain Scale: Hurts little more Pain Location: L hip Pain Descriptors / Indicators: Discomfort;Operative site guarding Pain Intervention(s): Limited activity within patient's tolerance;Repositioned;Ice applied     Hand Dominance Right   Extremity/Trunk Assessment Upper Extremity Assessment Upper Extremity Assessment: Generalized weakness  Lower Extremity Assessment Lower Extremity Assessment: Generalized weakness;LLE deficits/detail LLE: Unable to fully assess due to pain       Communication Communication Communication: No difficulties   Cognition Arousal/Alertness: Awake/alert Behavior During Therapy: WFL for tasks assessed/performed Overall Cognitive Status: Impaired/Different from baseline Area of Impairment: Orientation;Attention;Memory;Following commands;Safety/judgement;Problem solving                 Orientation Level: Disoriented to;Place;Time;Situation Current Attention Level:  Selective Memory: Decreased short-term memory;Decreased recall of precautions Following Commands: Follows one step commands with increased time Safety/Judgement: Decreased awareness of safety;Decreased awareness of deficits   Problem Solving: Decreased initiation;Difficulty sequencing;Slow processing;Requires verbal cues;Requires tactile cues General Comments: Pt oriented to self only, repeatedly states she is ready to go home and does not retain when reminded she is staying in hospital. Pt recognizes her nephew but unable to state her relationship to him (states he is her brother in law repeatedly).   General Comments       Exercises Exercises: Other exercises Other Exercises Other Exercises: Pt and family educated re: OT role, DME recs, d/c recs, falls prevention, ECS, re-orientation, safe t/f technique Other Exercises: LBD, sit<>stand, sitting/standing balance/tolerance, ~5 ft mobility, meal ordering   Shoulder Instructions      Home Living Family/patient expects to be discharged to:: Private residence Living Arrangements: Alone Available Help at Discharge: Family;Available PRN/intermittently Type of Home: House Home Access: Stairs to enter [employer] of Steps: 1 Entrance Stairs-Rails: None Home Layout: One level;Laundry or work area in [place]: Environmental consultant - 2 wheels          Prior Functioning/Environment Level of Independence: Independent with assistive device(s)        Comments: Pt reports MOD I using RW for mobility and ADLs. Nephew reports he drives her to appointments and groceries, she cooks.        OT Problem List: Decreased strength;Decreased range of motion;Decreased activity tolerance;Impaired balance (sitting and/or standing);Decreased cognition;Decreased safety awareness;Decreased knowledge of use of DME or AE      OT Treatment/Interventions: Self-care/ADL training;Therapeutic exercise;Energy conservation;DME  and/or AE instruction;Therapeutic activities;Patient/family education;Balance training    OT Goals(Current goals can be found in the care plan section) Acute Rehab OT Goals Patient Stated Goal: to go home OT Goal Formulation: With patient/family Time For Goal Achievement: 10/01/20 Potential to Achieve Goals: Good ADL Goals Pt Will Perform Grooming: with modified independence;sitting Pt Will Perform Lower Body Dressing: with min assist;sitting/lateral leans Pt Will Transfer to Toilet: with supervision;ambulating;regular height toilet (c LRAD PRN)  OT Frequency: Min 2X/week   Barriers to D/C: Decreased caregiver support             AM-PAC OT "6 Clicks" Daily Activity     Outcome Measure Help from another person eating meals?: None Help from another person taking care of personal grooming?: A Little Help from another person toileting, which includes using toliet, bedpan, or urinal?: A Lot Help from another person bathing (including washing, rinsing, drying)?: A Lot Help from another person to put on and taking off regular upper body clothing?: A Little Help from another person to put on and taking off regular lower body clothing?: A Lot 6 Click Score: 16   End of Session Equipment Utilized During Treatment: Rolling walker Nurse Communication: Mobility status  Activity Tolerance: Patient tolerated treatment well Patient left: in chair;with call bell/phone within reach;with chair alarm set;with family/visitor present  OT Visit  Diagnosis: Other abnormalities of gait and mobility (R26.89);Muscle weakness (generalized) (M62.81)                Time: 8333-8329 OT Time Calculation (min): 47 min Charges:  OT General Charges $OT Visit: 1 Visit OT Evaluation $OT Eval Low Complexity: 1 Low OT Treatments $Self Care/Home Management : 38-52 mins   Dessie Coma, M.S. OTR/L  09/17/20, 1:23 PM  ascom [phone]

## 2020-09-17 NOTE — Evaluation (Addendum)
Physical Therapy Evaluation Patient Details Name: [name] MRN: [id number] DOB: 1924-09-21 Today's Date: 09/17/2020   History of Present Illness  Patient is a 87 y.o. Caucasian female with medical history significant for asthma, osteoarthritis, atrial fibrillation, eczema, GERD, TIA and hypothyroidism, pacemaker placement who presented to the ER with acute onset of left hip pain after having an accidental mechanical fall.  Patient was found to have a left hip fracture. Status post IM nial of left hip  Clinical Impression  Patient is confused but cooperative and agreeable to PT evaluation. Patient needs to be re-oriented multiple time during session and is able to follow single step commands consistently. Patient reports she is previously independent with activity with rolling walker and lives at home alone.  Currently, patient requires assistance for bed mobility and transfers. Multiple standing bouts performed from bed and from bed side commode. Patient required assistance to ambulate two short bouts with rolling walker. Decreased weight acceptance on LLE with ambulation. Recommend PT to maximize independence and facilitate return to prior level of function. Given mobility deficits and safety concerns with cognition, SNF is recommended for short term rehab.      Follow Up Recommendations SNF    Equipment Recommendations   (to be determined at next level of care)    Recommendations for Other Services       Precautions / Restrictions Precautions Precautions: Fall Restrictions Weight Bearing Restrictions: Yes LLE Weight Bearing: Weight bearing as tolerated      Mobility  Bed Mobility Overal bed mobility: Needs Assistance Bed Mobility: Supine to Sit;Sit to Supine     Supine to sit: Mod assist     General bed mobility comments: assistance for LLE and intermittent trunk support provided. verbal cues for technique    Transfers Overall transfer level: Needs  assistance Equipment used: Rolling walker (2 wheeled) Transfers: Sit to/from Stand Sit to Stand: Mod assist         General transfer comment: Mod A for standing from bed and bed side commode. multiple standing bouts performed. verbal cues for technique  Ambulation/Gait Ambulation/Gait assistance: Min assist;Mod assist Gait Distance (Feet): 3 Feet (x 2 bouts) Assistive device: Rolling walker (2 wheeled) Gait Pattern/deviations: Antalgic;Decreased step length - left;Decreased step length - right Gait velocity: decreased   General Gait Details: verbal cues for technique. facilitation for weight shift for advancement of LLE. decreased weight acceptance on LLE despite cues for proper use of rolling walker  Stairs            Wheelchair Mobility    Modified Rankin (Stroke Patients Only)       Balance Overall balance assessment: Needs assistance Sitting-balance support: No upper extremity supported;Feet supported Sitting balance-Leahy Scale: Fair     Standing balance support: Bilateral upper extremity supported Standing balance-Leahy Scale: Poor                               Pertinent Vitals/Pain Pain Assessment: 0-10 Pain Score: 4  Faces Pain Scale: Hurts little more Pain Location: left hip Pain Descriptors / Indicators: Discomfort Pain Intervention(s): Limited activity within patient's tolerance    Home Living Family/patient expects to be discharged to:: Private residence Living Arrangements: Alone Available Help at Discharge: Family;Available PRN/intermittently Type of Home: House Home Access: Stairs to enter Entrance Stairs-Rails: None Entrance Stairs-Number of Steps: 1 Home Layout: One level;Laundry or work area in [place]: Environmental consultant - 2 wheels  Prior Function Level of Independence: Independent with assistive device(s)         Comments: ambulation using rolling walker. patient reports she drives, however question accuracy  of this information     Hand Dominance   Dominant Hand: Right    Extremity/Trunk Assessment   Upper Extremity Assessment Upper Extremity Assessment: Overall WFL for tasks assessed    Lower Extremity Assessment Lower Extremity Assessment: Generalized weakness;LLE deficits/detail;RLE deficits/detail RLE Deficits / Details: generalized wekaness LLE Deficits / Details: patient able to activate hip/knee/ankle movement in gravity eliminated position. patient able to accept partial weight in standing with no knee buckling LLE: Unable to fully assess due to pain       Communication   Communication: No difficulties  Cognition Arousal/Alertness: Awake/alert Behavior During Therapy: Anxious (patient appears paranoid at times, especially with noises coming from the hallway) Overall Cognitive Status: Impaired/Different from baseline Area of Impairment: Orientation;Following commands;Memory                 Orientation Level: Disoriented to;Situation;Place;Time (reports she is in a resturant, thinks the day of the week is Wednesday) Current Attention Level: Selective Memory: Decreased recall of precautions;Decreased short-term memory Following Commands: Follows one step commands inconsistently Safety/Judgement: Decreased awareness of safety;Decreased awareness of deficits   Problem Solving: Decreased initiation;Difficulty sequencing;Slow processing;Requires verbal cues;Requires tactile cues General Comments: Pt oriented to self only, repeatedly states she is ready to go home and does not retain when reminded she is staying in hospital. Pt recognizes her nephew but unable to state her relationship to him (states he is her brother in law repeatedly).      General Comments      Exercises Other Exercises Other Exercises: Pt and family educated re: OT role, DME recs, d/c recs, falls prevention, ECS, re-orientation, safe t/f technique Other Exercises: LBD, sit<>stand, sitting/standing  balance/tolerance, ~5 ft mobility, meal ordering   Assessment/Plan    PT Assessment Patient needs continued PT services  PT Problem List Decreased strength;Decreased range of motion;Decreased activity tolerance;Decreased mobility;Decreased balance;Decreased cognition;Decreased knowledge of use of DME;Decreased safety awareness;Decreased knowledge of precautions;Pain       PT Treatment Interventions DME instruction;Gait training;Stair training;Functional mobility training;Therapeutic activities;Therapeutic exercise;Balance training;Neuromuscular re-education;Patient/family education;Cognitive remediation    PT Goals (Current goals can be found in the Care Plan section)  Acute Rehab PT Goals Patient Stated Goal: to go home PT Goal Formulation: With patient Time For Goal Achievement: 10/01/20 Potential to Achieve Goals: Fair    Frequency BID   Barriers to discharge Decreased caregiver support      Co-evaluation               AM-PAC PT "6 Clicks" Mobility  Outcome Measure Help needed turning from your back to your side while in a flat bed without using bedrails?: A Little Help needed moving from lying on your back to sitting on the side of a flat bed without using bedrails?: A Little Help needed moving to and from a bed to a chair (including a wheelchair)?: A Lot Help needed standing up from a chair using your arms (e.g., wheelchair or bedside chair)?: A Lot Help needed to walk in hospital room?: A Little   6 Click Score: 13    End of Session Equipment Utilized During Treatment: Gait belt Activity Tolerance: Patient tolerated treatment well Patient left: with call bell/phone within reach;in chair;with chair alarm set;with SCD's reapplied Nurse Communication: Mobility status PT Visit Diagnosis: Unsteadiness on feet (R26.81);Muscle weakness (generalized) (M62.81);Difficulty in walking, not  elsewhere classified (R26.2)    Time: 6720-9470 PT Time Calculation (min) (ACUTE  ONLY): 43 min   Charges:   PT Evaluation $PT Eval Moderate Complexity: 1 Mod PT Treatments $Gait Training: 8-22 mins $Therapeutic Activity: 8-22 mins        Minna Merritts, PT, MPT   Percell Locus 09/17/2020, 1:22 PM

## 2020-09-17 NOTE — Plan of Care (Signed)
  Problem: Education: Goal: Knowledge of General Education information will improve Description: Including pain rating scale, medication(s)/side effects and non-pharmacologic comfort measures Outcome: Not Progressing   Problem: Health Behavior/Discharge Planning: Goal: Ability to manage health-related needs will improve Outcome: Not Progressing   Problem: Clinical Measurements: Goal: Ability to maintain clinical measurements within normal limits will improve Outcome: Not Progressing Goal: Will remain free from infection Outcome: Not Progressing Goal: Diagnostic test results will improve Outcome: Not Progressing Goal: Respiratory complications will improve Outcome: Not Progressing Goal: Cardiovascular complication will be avoided Outcome: Not Progressing   Problem: Activity: Goal: Risk for activity intolerance will decrease Outcome: Not Progressing   Problem: Nutrition: Goal: Adequate nutrition will be maintained Outcome: Not Progressing   Problem: Coping: Goal: Level of anxiety will decrease Outcome: Not Progressing   Problem: Elimination: Goal: Will not experience complications related to bowel motility Outcome: Not Progressing Goal: Will not experience complications related to urinary retention Outcome: Not Progressing   Problem: Pain Managment: Goal: General experience of comfort will improve Outcome: Not Progressing   Problem: Safety: Goal: Ability to remain free from injury will improve Outcome: Not Progressing   Problem: Safety: Goal: Ability to remain free from injury will improve Outcome: Not Progressing   Problem: Skin Integrity: Goal: Risk for impaired skin integrity will decrease Outcome: Not Progressing   Problem: Education: Goal: Verbalization of understanding the information provided (i.e., activity precautions, restrictions, etc) will improve Outcome: Not Progressing Goal: Individualized Educational Video(s) Outcome: Not Progressing    Problem: Activity: Goal: Ability to ambulate and perform ADLs will improve Outcome: Not Progressing   Problem: Clinical Measurements: Goal: Postoperative complications will be avoided or minimized Outcome: Not Progressing   Problem: Self-Concept: Goal: Ability to maintain and perform role responsibilities to the fullest extent possible will improve Outcome: Not Progressing   Problem: Pain Management: Goal: Pain level will decrease Outcome: Not Progressing

## 2020-09-17 NOTE — TOC Progression Note (Signed)
Transition of Care [facility]) - Progression Note    Patient Details  Name: [name] MRN: [id number] Date of Birth: Oct 21, 1924  Transition of Care [facility]) CM/SW [place], RN Phone Number: 09/17/2020, 1:17 PM  Clinical Narrative:     Met with the patient and discussed DC plan and needs, She lives alone with 2 dogs, her nephew helps her some, she walks at baseline with a walker, She is agreeable to go to SNF for STR, PASSR obtained, FL2 completed and bedsearch done, awaiting offers and will review once obtained       Expected Discharge Plan and Services                                                 Social Determinants of Health (SDOH) Interventions    Readmission Risk Interventions No flowsheet data found.

## 2020-09-18 DIAGNOSIS — Z950 Presence of cardiac pacemaker: Secondary | ICD-10-CM | POA: Diagnosis not present

## 2020-09-18 DIAGNOSIS — S72145A Nondisplaced intertrochanteric fracture of left femur, initial encounter for closed fracture: Secondary | ICD-10-CM | POA: Diagnosis not present

## 2020-09-18 DIAGNOSIS — D649 Anemia, unspecified: Secondary | ICD-10-CM

## 2020-09-18 DIAGNOSIS — I480 Paroxysmal atrial fibrillation: Secondary | ICD-10-CM | POA: Diagnosis not present

## 2020-09-18 LAB — BASIC METABOLIC PANEL
Anion gap: 5 (ref 5–15)
BUN: 28 mg/dL — ABNORMAL HIGH (ref 8–23)
CO2: 26 mmol/L (ref 22–32)
Calcium: 8.1 mg/dL — ABNORMAL LOW (ref 8.9–10.3)
Chloride: 104 mmol/L (ref 98–111)
Creatinine, Ser: 0.67 mg/dL (ref 0.44–1.00)
GFR, Estimated: >60 mL/min (ref >60)
Glucose, Bld: 116 mg/dL — ABNORMAL HIGH (ref 70–99)
Potassium: 4.3 mmol/L (ref 3.5–5.1)
Sodium: 135 mmol/L (ref 135–145)

## 2020-09-18 LAB — CBC
HCT: 26.2 % — ABNORMAL LOW (ref 36.0–46.0)
Hemoglobin: 8.5 g/dL — ABNORMAL LOW (ref 12.0–15.0)
MCH: 27.3 pg (ref 26.0–34.0)
MCHC: 32.4 g/dL (ref 30.0–36.0)
MCV: 84.2 fL (ref 80.0–100.0)
Platelets: 182 10*3/uL (ref 150–400)
RBC: 3.11 MIL/uL — ABNORMAL LOW (ref 3.87–5.11)
RDW: 14.6 % (ref 11.5–15.5)
WBC: 15 10*3/uL — ABNORMAL HIGH (ref 4.0–10.5)
nRBC: 0 % (ref 0.0–0.2)

## 2020-09-18 NOTE — Care Management Important Message (Signed)
Important Message  Patient Details  Name: [name] MRN: [id number] Date of Birth: 1925-01-28   Medicare Important Message Given:  Yes  I reviewed the Important Message from Medicare with this patient.  Seen she had signed the original copy and did not want to sign another copy. She was very pleasant and talking about her 2 dogs.  I left a copy with her and thanked her for her time.    Juliann Pulse A Myha Arizpe 09/18/2020, 11:06 AM

## 2020-09-18 NOTE — TOC Progression Note (Signed)
Transition of Care [facility]) - Progression Note    Patient Details  Name: [name] MRN: [id number] Date of Birth: Jun 30, 1924  Transition of Care [facility]) CM/SW [place], RN Phone Number: 09/18/2020, 9:56 AM  Clinical Narrative:    Spoke with the patient to discuss Bed offers, she is slightly confused today, I called her nephew Glendell Docker, and reviewed the bed choices with both, they agreed on [facility], I accepted the bed in the hub and via telephone with Tammy, I notified the Dr that we have a bed as well as the bedside nurse        Expected Discharge Plan and Services                                                 Social Determinants of Health (SDOH) Interventions    Readmission Risk Interventions No flowsheet data found.

## 2020-09-18 NOTE — Progress Notes (Signed)
Physical Therapy Treatment Patient Details Name: [name] MRN: [id number] DOB: 06/13/1924 Today's Date: 09/18/2020    History of Present Illness Patient is a 87 y.o. Caucasian female with medical history significant for asthma, osteoarthritis, atrial fibrillation, eczema, GERD, TIA and hypothyroidism, pacemaker placement who presented to the ER with acute onset of left hip pain after having an accidental mechanical fall.  Patient was found to have a left hip fracture. Status post IM nial of left hip    PT Comments    Pt received in bed, slightly confused at times, "that doesn't look like my bathroom".  Pt redirected several times throughout session without difficulty. Supine to sit with ModA, good edge of bed sitting balance, Sit to stand from bed and commode with ModA initially transitioning to CGA.  Gait training with RW 30 ft with CGA and good floor clearance of L foot during swing through.  4/10 discomfort L Hip with weight bearing prior to pain meds.  Pt is progressing well, awaiting possible d/c to SNF today.   Follow Up Recommendations  SNF     Equipment Recommendations       Recommendations for Other Services       Precautions / Restrictions Precautions Precautions: Fall Restrictions Weight Bearing Restrictions: Yes LLE Weight Bearing: Weight bearing as tolerated    Mobility  Bed Mobility Overal bed mobility: Needs Assistance Bed Mobility: Supine to Sit;Sit to Supine     Supine to sit: Mod assist     General bed mobility comments: Increased time to transition to edge of bed    Transfers Overall transfer level: Needs assistance Equipment used: Rolling walker (2 wheeled) Transfers: Sit to/from Stand Sit to Stand: Mod assist         General transfer comment: ModA initially, then pt able to complete with CGA  Ambulation/Gait Ambulation/Gait assistance: Min guard Gait Distance (Feet): 30 Feet Assistive device: Rolling walker (2 wheeled) Gait  Pattern/deviations: Antalgic;Decreased step length - left;Decreased step length - right Gait velocity: decreased   General Gait Details:  (Pt able to clear floor with L foot)   Stairs             Wheelchair Mobility    Modified Rankin (Stroke Patients Only)       Balance                                            Cognition Arousal/Alertness: Awake/alert Behavior During Therapy: Anxious Overall Cognitive Status: Impaired/Different from baseline Area of Impairment: Orientation;Following commands;Memory                 Orientation Level: Disoriented to;Situation;Place;Time Current Attention Level: Selective Memory: Decreased recall of precautions;Decreased short-term memory Following Commands: Follows one step commands inconsistently Safety/Judgement: Decreased awareness of safety;Decreased awareness of deficits   Problem Solving: Decreased initiation;Difficulty sequencing;Slow processing;Requires verbal cues;Requires tactile cues General Comments: patient able to follow single step commands consistently      Exercises General Exercises - Lower Extremity Ankle Circles/Pumps: AROM;Strengthening;Both;10 reps Long Arc Quad: AAROM;Strengthening;Left;10 reps;Seated Heel Slides: AAROM;Strengthening;Left;10 reps;Seated    General Comments        Pertinent Vitals/Pain Pain Assessment: Faces Faces Pain Scale: Hurts little more Pain Location: left hip Pain Descriptors / Indicators: Discomfort Pain Intervention(s): Monitored during session    Home Living  Prior Function            PT Goals (current goals can now be found in the care plan section) Acute Rehab PT Goals Patient Stated Goal: to go home at night Progress towards PT goals: Progressing toward goals    Frequency    7X/week      PT Plan      Co-evaluation              AM-PAC PT "6 Clicks" Mobility   Outcome Measure  Help needed  turning from your back to your side while in a flat bed without using bedrails?: A Little Help needed moving from lying on your back to sitting on the side of a flat bed without using bedrails?: A Little Help needed moving to and from a bed to a chair (including a wheelchair)?: A Lot Help needed standing up from a chair using your arms (e.g., wheelchair or bedside chair)?: A Lot Help needed to walk in hospital room?: A Little Help needed climbing 3-5 steps with a railing? : Total 6 Click Score: 14    End of Session Equipment Utilized During Treatment: Gait belt Activity Tolerance: Patient tolerated treatment well;Patient limited by fatigue Patient left: in chair;with call bell/phone within reach;with chair alarm set Nurse Communication: Mobility status PT Visit Diagnosis: Unsteadiness on feet (R26.81);Muscle weakness (generalized) (M62.81);Difficulty in walking, not elsewhere classified (R26.2)     Time: 8546-2703 PT Time Calculation (min) (ACUTE ONLY): 44 min  Charges:  $Gait Training: 8-22 mins $Therapeutic Exercise: 8-22 mins $Therapeutic Activity: 8-22 mins                     Mikel Cella, PTA   [name] 09/18/2020, 10:09 AM

## 2020-09-18 NOTE — Progress Notes (Signed)
[facility] PROGRESS NOTE   [name] GMW:[id number] DOB: 10/24/1924 DOA: 09/15/2020  PCP: Ezequiel Kayser, MD  Brief History/Interval Summary: 87 y.o. Caucasian female with medical history significant for asthma, osteoarthritis, atrial fibrillation, eczema, GERD, TIA and hypothyroidism, who presented to the ER with acute onset of left hip pain after having an accidental mechanical fall.    Patient was found to have a left hip fracture.  She was hospitalized for further management.    Reason for Visit: Left hip fracture  Consultants: Orthopedic  Procedures: Left intramedullary nail intertrochanteric on 5/25  Antibiotics: Anti-infectives (From admission, onward)   Start     Dose/Rate Route Frequency Ordered Stop   09/17/20 0000  ceFAZolin (ANCEF) IVPB 1 g/50 mL premix  Status:  Discontinued        1 g 100 mL/hr over 30 Minutes Intravenous Every 8 hours 09/16/20 1840 09/16/20 2007   09/16/20 2300  ceFAZolin (ANCEF) 1 g in sodium chloride 0.9 % 100 mL IVPB        1 g 200 mL/hr over 30 Minutes Intravenous Every 8 hours 09/16/20 2007 09/17/20 1439   09/16/20 1500  ceFAZolin (ANCEF) powder 1 g  Status:  Discontinued        1 g Other To Surgery 09/16/20 0758 09/16/20 0759   09/16/20 1500  ceFAZolin (ANCEF) 1 g in sodium chloride 0.9 % 100 mL IVPB        1 g 200 mL/hr over 30 Minutes Intravenous On call to O.R. 09/16/20 0801 09/16/20 1611   09/16/20 1446  sodium chloride 0.9 % with ceFAZolin (ANCEF) ADS Med       Note to Pharmacy: Norton Blizzard  : cabinet override      09/16/20 1446 09/16/20 1612      Subjective/Interval History: Patient became delirious yesterday.  Seems to be doing better this morning.  Answering questions appropriately.  Denies any significant pain in her lower extremities    Assessment/Plan:  Left hip fracture secondary to mechanical fall Patient seen by orthopedics and underwent ORIF on 5/25.  Pain is reasonably well controlled.  PT and  OT recommends SNF. Eliquis was resumed.  History of pacemaker placement/paroxysmal atrial fibrillation Pacemaker was placed in 2015 for SA node dysfunction with recent generator change in 2021.  Last seen by cardiology in February 2022.  Cardiac status was noted to be stable at that time. Atrial fibrillation is noted to be stable.  Eliquis was resumed.  Normocytic anemia/postoperative anemia Is likely due to combination of surgery as well as dilutional effect.  No other overt bleeding is noted.  No significant bruising noted over the surgical site.  We will recheck hemoglobin tomorrow.  Transfuse for less than 7.    Leukocytosis Probably reactive.  Noted to be afebrile.  No obvious source of infection.  Continue to monitor for now.  History of asthma Continue inhalers.  Stable  Dyslipidemia Continue statin.  Hypothyroidism Continue levothyroxine.   DVT Prophylaxis: Patient is on therapeutic Eliquis Code Status: Full code Family Communication: No family at bedside Disposition Plan: SNF  Status is: Inpatient  Remains inpatient appropriate because:Ongoing active pain requiring inpatient pain management, Unsafe d/c plan and Inpatient level of care appropriate due to severity of illness   Dispo: The patient is from: Home              Anticipated d/c is to: SNF              Patient currently is not  medically stable to d/c.   Difficult to place patient No     Medications:  Scheduled: . apixaban  5 mg Oral BID  . atorvastatin  40 mg Oral QPM  . cholecalciferol  2,000 Units Oral Daily  . famotidine  20 mg Oral BID  . levothyroxine  112 mcg Oral Q0600  . mometasone-formoterol  2 puff Inhalation BID  . montelukast  10 mg Oral QHS  . olopatadine  1 drop Both Eyes QHS  . omega-3 acid ethyl esters  1,000 mg Oral Daily   Continuous: . sodium chloride 10 mL/hr at 09/17/20 1042   KWI:[id number] **OR** acetaminophen, haloperidol lactate, hydrocerin, HYDROmorphone (DILAUDID)  injection, magnesium hydroxide, ondansetron **OR** ondansetron (ZOFRAN) IV, traMADol, traZODone   Objective:  Vital Signs  Vitals:   09/17/20 1532 09/17/20 2036 09/18/20 0516 09/18/20 0818  BP: (!) 167/55 106/70 (!) 105/44 (!) 106/44  Pulse: 68 65 62 (!) 59  Resp: 18 18 18 16   Temp: 98 F (36.7 C) 97.8 F (36.6 C) 98.7 F (37.1 C) 98.7 F (37.1 C)  TempSrc: Oral     SpO2: 97% 98% 97% (!) 84%  Weight:      Height:        Intake/Output Summary (Last 24 hours) at 09/18/2020 1104 Last data filed at 09/18/2020 1020 Gross per 24 hour  Intake 120 ml  Output --  Net 120 ml   Filed Weights   09/15/20 2112 09/16/20 0011  Weight: 61.2 kg 66.7 kg    General appearance: Awake alert.  In no distress.  Mildly distracted Resp: Clear to auscultation bilaterally.  Normal effort Cardio: S1-S2 is normal regular.  No S3-S4.  No rubs murmurs or bruit GI: Abdomen is soft.  Nontender nondistended.  Bowel sounds are present normal.  No masses organomegaly Extremities: No significant bruising noted over the left thigh area Neurologic: She is oriented to place year month..  No focal neurological deficits.      Lab Results:  Data Reviewed: I have personally reviewed following labs and imaging studies  CBC: Recent Labs  Lab 09/15/20 2117 09/16/20 0546 09/17/20 0422 09/18/20 0448  WBC 13.7* 17.8* 15.2* 15.0*  NEUTROABS 10.2*  --   --   --   HGB 12.9 11.4* 9.4* 8.5*  HCT 39.9 34.4* 29.9* 26.2*  MCV 85.4 84.1 86.4 84.2  PLT 249 216 211 834    Basic Metabolic Panel: Recent Labs  Lab 09/15/20 2117 09/16/20 0546 09/17/20 0422 09/18/20 0448  NA 136 138 139 135  K 4.0 4.5 4.1 4.3  CL 101 104 107 104  CO2 26 27 23 26   GLUCOSE 149* 137* 159* 116*  BUN 30* 30* 28* 28*  CREATININE 0.57 0.64 0.75 0.67  CALCIUM 8.5* 8.3* 8.3* 8.1*    GFR: Estimated Creatinine Clearance: 41.3 mL/min (by C-G formula based on SCr of 0.67 mg/dL).    Recent Results (from the past 240 hour(s))   Resp Panel by RT-PCR (Flu A&B, Covid) Nasopharyngeal Swab     Status: None   Collection Time: 09/15/20 10:39 PM   Specimen: Nasopharyngeal Swab; Nasopharyngeal(NP) swabs in vial transport medium  Result Value Ref Range Status   SARS Coronavirus 2 by RT PCR NEGATIVE NEGATIVE Final    Comment: (NOTE) SARS-CoV-2 target nucleic acids are NOT DETECTED.  The SARS-CoV-2 RNA is generally detectable in upper respiratory specimens during the acute phase of infection. The lowest concentration of SARS-CoV-2 viral copies this assay can detect is 138 copies/mL. A negative result does  not preclude SARS-Cov-2 infection and should not be used as the sole basis for treatment or other patient management decisions. A negative result may occur with  improper specimen collection/handling, submission of specimen other than nasopharyngeal swab, presence of viral mutation(s) within the areas targeted by this assay, and inadequate number of viral copies(<138 copies/mL). A negative result must be combined with clinical observations, patient history, and epidemiological information. The expected result is Negative.  Fact Sheet for Patients:  EntrepreneurPulse.com.au  Fact Sheet for Healthcare Providers:  IncredibleEmployment.be  This test is no t yet approved or cleared by the [place] FDA and  has been authorized for detection and/or diagnosis of SARS-CoV-2 by FDA under an Emergency Use Authorization (EUA). This EUA will remain  in effect (meaning this test can be used) for the duration of the COVID-19 declaration under Section 564(b)(1) of the Act, 21 U.S.C.section 360bbb-3(b)(1), unless the authorization is terminated  or revoked sooner.       Influenza A by PCR NEGATIVE NEGATIVE Final   Influenza B by PCR NEGATIVE NEGATIVE Final    Comment: (NOTE) The Xpert Xpress SARS-CoV-2/FLU/RSV plus assay is intended as an aid in the diagnosis of influenza from  Nasopharyngeal swab specimens and should not be used as a sole basis for treatment. Nasal washings and aspirates are unacceptable for Xpert Xpress SARS-CoV-2/FLU/RSV testing.  Fact Sheet for Patients: EntrepreneurPulse.com.au  Fact Sheet for Healthcare Providers: IncredibleEmployment.be  This test is not yet approved or cleared by the [place] FDA and has been authorized for detection and/or diagnosis of SARS-CoV-2 by FDA under an Emergency Use Authorization (EUA). This EUA will remain in effect (meaning this test can be used) for the duration of the COVID-19 declaration under Section 564(b)(1) of the Act, 21 U.S.C. section 360bbb-3(b)(1), unless the authorization is terminated or revoked.  Performed at [facility], [address], [place], [place] [address]   Surgical PCR screen     Status: None   Collection Time: 09/16/20  4:23 AM   Specimen: Nasal Mucosa; Nasal Swab  Result Value Ref Range Status   MRSA, PCR NEGATIVE NEGATIVE Final   Staphylococcus aureus NEGATIVE NEGATIVE Final    Comment: (NOTE) The Xpert SA Assay (FDA approved for NASAL specimens in patients 27 years of age and older), is one component of a comprehensive surveillance program. It is not intended to diagnose infection nor to guide or monitor treatment. Performed at [facility], [address], [place], [place] [address]       Radiology Studies: DG HIP OPERATIVE [facility] OR W/O PELVIS LEFT  Result Date: 09/16/2020 CLINICAL DATA:  Left femur IM nail. EXAM: OPERATIVE LEFT HIP (WITH PELVIS IF PERFORMED) TECHNIQUE: Fluoroscopic spot image(s) were submitted for interpretation post-operatively. COMPARISON:  Preoperative radiograph yesterday. FINDINGS: Four fluoroscopic spot views of the left hip obtained in the operating room. Intramedullary nail with trans trochanteric and distal locking screw fixation traverse comminuted intertrochanteric femur  fracture. Total fluoroscopy time 1 minutes 11 seconds. Total dose 15.33 mGy. IMPRESSION: Procedural fluoroscopy during ORIF intertrochanteric left femur fracture. Electronically Signed   By: Keith Rake M.D.   On: 09/16/2020 17:53       LOS: 3 days   [facility] Pager on [email or web address]  09/18/2020, 11:04 AM

## 2020-09-19 DIAGNOSIS — S72145A Nondisplaced intertrochanteric fracture of left femur, initial encounter for closed fracture: Secondary | ICD-10-CM | POA: Diagnosis not present

## 2020-09-19 LAB — CBC
HCT: 24.8 % — ABNORMAL LOW (ref 36.0–46.0)
Hemoglobin: 8 g/dL — ABNORMAL LOW (ref 12.0–15.0)
MCH: 27.8 pg (ref 26.0–34.0)
MCHC: 32.3 g/dL (ref 30.0–36.0)
MCV: 86.1 fL (ref 80.0–100.0)
Platelets: 213 10*3/uL (ref 150–400)
RBC: 2.88 MIL/uL — ABNORMAL LOW (ref 3.87–5.11)
RDW: 14.7 % (ref 11.5–15.5)
WBC: 16.9 10*3/uL — ABNORMAL HIGH (ref 4.0–10.5)
nRBC: 0 % (ref 0.0–0.2)

## 2020-09-19 LAB — BASIC METABOLIC PANEL
Anion gap: 8 (ref 5–15)
BUN: 24 mg/dL — ABNORMAL HIGH (ref 8–23)
CO2: 27 mmol/L (ref 22–32)
Calcium: 8.4 mg/dL — ABNORMAL LOW (ref 8.9–10.3)
Chloride: 102 mmol/L (ref 98–111)
Creatinine, Ser: 0.58 mg/dL (ref 0.44–1.00)
GFR, Estimated: >60 mL/min (ref >60)
Glucose, Bld: 123 mg/dL — ABNORMAL HIGH (ref 70–99)
Potassium: 4.6 mmol/L (ref 3.5–5.1)
Sodium: 137 mmol/L (ref 135–145)

## 2020-09-19 LAB — SARS CORONAVIRUS 2 (TAT 6-24 HRS): SARS Coronavirus 2: NEGATIVE

## 2020-09-19 MED ORDER — POLYETHYLENE GLYCOL 3350 17 G PO PACK: 17.0000 g | PACK | Freq: Every day | ORAL | Status: DC

## 2020-09-19 MED ORDER — TRAMADOL HCL 50 MG PO TABS: 50.0000 mg | ORAL_TABLET | Freq: Four times a day (QID) | ORAL | 0 refills | Status: DC | PRN

## 2020-09-19 MED ORDER — BISACODYL 10 MG RE SUPP
10.0000 mg | Freq: Once | RECTAL | Status: AC
  Administered 2020-09-19: 10 mg via RECTAL
  Filled 2020-09-19: qty 1

## 2020-09-19 MED ORDER — DOCUSATE SODIUM 100 MG PO CAPS: 100.0000 mg | ORAL_CAPSULE | Freq: Two times a day (BID) | ORAL | Status: DC

## 2020-09-19 MED ORDER — DOCUSATE SODIUM 100 MG PO CAPS: 100.0000 mg | ORAL_CAPSULE | Freq: Two times a day (BID) | ORAL | 0 refills | Status: DC

## 2020-09-19 MED ORDER — POLYETHYLENE GLYCOL 3350 17 G PO PACK: 17.0000 g | PACK | Freq: Every day | ORAL | 0 refills | Status: DC

## 2020-09-19 NOTE — Discharge Summary (Signed)
[facility]  Physician Discharge Summary   Patient ID: [name] MRN: [id number] DOB/AGE: 12-18-1924 87 y.o.  Admit date: 09/15/2020 Discharge date:   09/19/2020   PCP: Ezequiel Kayser, MD  DISCHARGE DIAGNOSES:  Left hip fracture secondary to mechanical fall status post surgery History of paroxysmal atrial fibrillation History of pacemaker placement Normocytic anemia/postoperative anemia Leukocytosis possibly reactive Hypothyroidism Dyslipidemia History of asthma   RECOMMENDATIONS FOR OUTPATIENT FOLLOW UP: 1. Please check CBC and basic metabolic panel in 1 week 2. Please consult palliative care at the skilled nursing facility to discuss goals of care    Home Health: Going to SNF Equipment/Devices: None  CODE STATUS: Full code  DISCHARGE CONDITION: fair  Diet recommendation: Heart healthy  INITIAL HISTORY: 87 y.o.Caucasian femalewith medical history significant forasthma, osteoarthritis, atrial fibrillation, eczema, GERD, TIA and hypothyroidism, who presented to the ER with acute onset of left hip pain after having an accidental mechanical fall.  Patient was found to have a left hip fracture.  She was hospitalized for further management.    Consultants: Orthopedic  Procedures: Left intramedullary nail intertrochanteric on 5/25   HOSPITAL COURSE:   Left hip fracture secondary to mechanical fall Patient seen by orthopedics and underwent ORIF on 5/25.  Pain is reasonably well controlled.  PT and OT recommends SNF. Eliquis was resumed.  History of pacemaker placement/paroxysmal atrial fibrillation Pacemaker was placed in 2015 for SA node dysfunction with recent generator change in 2021.  Last seen by cardiology in February 2022.  Cardiac status was noted to be stable at that time. Atrial fibrillation is noted to be stable.  Eliquis was resumed.  Normocytic anemia/postoperative anemia Anemia is likely due to combination of surgery as well  as dilutional effect.  No other overt bleeding is noted.  No significant bruising noted over the surgical site.  Hemoglobin stable.  Will recommend this to be rechecked in the skilled nursing facility next week.   Leukocytosis Probably reactive.  Noted to be afebrile.  No obvious source of infection.  Recheck next week and if still elevated may need further work-up including referral to hematology.  Constipation We will place her on bowel regimen including Colace and MiraLAX.  We will give her suppository today prior to discharge.  History of asthma Continue inhalers.  Stable  Dyslipidemia Continue statin.  Hypothyroidism Continue levothyroxine.   Patient is stable.  Okay for discharge to skilled nursing facility once she has had a bowel movement.   PERTINENT LABS:  The results of significant diagnostics from this hospitalization (including imaging, microbiology, ancillary and laboratory) are listed below for reference.    Microbiology: Recent Results (from the past 240 hour(s))  Resp Panel by RT-PCR (Flu A&B, Covid) Nasopharyngeal Swab     Status: None   Collection Time: 09/15/20 10:39 PM   Specimen: Nasopharyngeal Swab; Nasopharyngeal(NP) swabs in vial transport medium  Result Value Ref Range Status   SARS Coronavirus 2 by RT PCR NEGATIVE NEGATIVE Final    Comment: (NOTE) SARS-CoV-2 target nucleic acids are NOT DETECTED.  The SARS-CoV-2 RNA is generally detectable in upper respiratory specimens during the acute phase of infection. The lowest concentration of SARS-CoV-2 viral copies this assay can detect is 138 copies/mL. A negative result does not preclude SARS-Cov-2 infection and should not be used as the sole basis for treatment or other patient management decisions. A negative result may occur with  improper specimen collection/handling, submission of specimen other than nasopharyngeal swab, presence of viral mutation(s) within the areas targeted  by this assay,  and inadequate number of viral copies(<138 copies/mL). A negative result must be combined with clinical observations, patient history, and epidemiological information. The expected result is Negative.  Fact Sheet for Patients:  EntrepreneurPulse.com.au  Fact Sheet for Healthcare Providers:  IncredibleEmployment.be  This test is no t yet approved or cleared by the [place] FDA and  has been authorized for detection and/or diagnosis of SARS-CoV-2 by FDA under an Emergency Use Authorization (EUA). This EUA will remain  in effect (meaning this test can be used) for the duration of the COVID-19 declaration under Section 564(b)(1) of the Act, 21 U.S.C.section 360bbb-3(b)(1), unless the authorization is terminated  or revoked sooner.       Influenza A by PCR NEGATIVE NEGATIVE Final   Influenza B by PCR NEGATIVE NEGATIVE Final    Comment: (NOTE) The Xpert Xpress SARS-CoV-2/FLU/RSV plus assay is intended as an aid in the diagnosis of influenza from Nasopharyngeal swab specimens and should not be used as a sole basis for treatment. Nasal washings and aspirates are unacceptable for Xpert Xpress SARS-CoV-2/FLU/RSV testing.  Fact Sheet for Patients: EntrepreneurPulse.com.au  Fact Sheet for Healthcare Providers: IncredibleEmployment.be  This test is not yet approved or cleared by the [place] FDA and has been authorized for detection and/or diagnosis of SARS-CoV-2 by FDA under an Emergency Use Authorization (EUA). This EUA will remain in effect (meaning this test can be used) for the duration of the COVID-19 declaration under Section 564(b)(1) of the Act, 21 U.S.C. section 360bbb-3(b)(1), unless the authorization is terminated or revoked.  Performed at [facility], [address] [place], [place] [address]   Surgical PCR screen     Status: None   Collection Time: 09/16/20  4:23 AM    Specimen: Nasal Mucosa; Nasal Swab  Result Value Ref Range Status   MRSA, PCR NEGATIVE NEGATIVE Final   Staphylococcus aureus NEGATIVE NEGATIVE Final    Comment: (NOTE) The Xpert SA Assay (FDA approved for NASAL specimens in patients 32 years of age and older), is one component of a comprehensive surveillance program. It is not intended to diagnose infection nor to guide or monitor treatment. Performed at [facility], [place], [place] [address]   SARS CORONAVIRUS 2 (TAT 6-24 HRS) Nasopharyngeal Nasopharyngeal Swab     Status: None   Collection Time: 09/18/20  3:02 PM   Specimen: Nasopharyngeal Swab  Result Value Ref Range Status   SARS Coronavirus 2 NEGATIVE NEGATIVE Final    Comment: (NOTE) SARS-CoV-2 target nucleic acids are NOT DETECTED.  The SARS-CoV-2 RNA is generally detectable in upper and lower respiratory specimens during the acute phase of infection. Negative results do not preclude SARS-CoV-2 infection, do not rule out co-infections with other pathogens, and should not be used as the sole basis for treatment or other patient management decisions. Negative results must be combined with clinical observations, patient history, and epidemiological information. The expected result is Negative.  Fact Sheet for Patients: SugarRoll.be  Fact Sheet for Healthcare Providers: https://www.woods-mathews.com/  This test is not yet approved or cleared by the [place] FDA and  has been authorized for detection and/or diagnosis of SARS-CoV-2 by FDA under an Emergency Use Authorization (EUA). This EUA will remain  in effect (meaning this test can be used) for the duration of the COVID-19 declaration under Se ction 564(b)(1) of the Act, 21 U.S.C. section 360bbb-3(b)(1), unless the authorization is terminated or revoked sooner.  Performed at [facility], [place]  [address] [place], [place] [address]       Labs:  COVID-19 Labs   Lab Results  Component Value Date   SARSCOV2NAA NEGATIVE 09/18/2020   [place] NEGATIVE 09/15/2020   [place] NEGATIVE 07/25/2019   [place] NEGATIVE 05/24/2019      Basic Metabolic Panel: Recent Labs  Lab 09/15/20 2117 09/16/20 0546 09/17/20 0422 09/18/20 0448 09/19/20 0441  NA 136 138 139 135 137  K 4.0 4.5 4.1 4.3 4.6  CL 101 104 107 104 102  CO2 26 27 23 26 27   GLUCOSE 149* 137* 159* 116* 123*  BUN 30* 30* 28* 28* 24*  CREATININE 0.57 0.64 0.75 0.67 0.58  CALCIUM 8.5* 8.3* 8.3* 8.1* 8.4*   CBC: Recent Labs  Lab 09/15/20 2117 09/16/20 0546 09/17/20 0422 09/18/20 0448 09/19/20 0441  WBC 13.7* 17.8* 15.2* 15.0* 16.9*  NEUTROABS 10.2*  --   --   --   --   HGB 12.9 11.4* 9.4* 8.5* 8.0*  HCT 39.9 34.4* 29.9* 26.2* 24.8*  MCV 85.4 84.1 86.4 84.2 86.1  PLT 249 216 211 182 213     IMAGING STUDIES DG Chest 1 View  Result Date: 09/15/2020 CLINICAL DATA:  87 year old female with fall and left hip pain. EXAM: CHEST  1 VIEW COMPARISON:  Chest radiograph dated 04/24/2019. FINDINGS: No focal consolidation, pleural effusion, or pneumothorax. Mild chronic interstitial coarsening and bronchitic changes. Top-normal cardiac size. Atherosclerotic calcification of the aorta. Left pectoral pacemaker device. No acute osseous pathology. IMPRESSION: No active cardiopulmonary disease. Electronically Signed   By: Anner Crete M.D.   On: 09/15/2020 22:32   DG HIP OPERATIVE UNILAT W OR W/O PELVIS LEFT  Result Date: 09/16/2020 CLINICAL DATA:  Left femur IM nail. EXAM: OPERATIVE LEFT HIP (WITH PELVIS IF PERFORMED) TECHNIQUE: Fluoroscopic spot image(s) were submitted for interpretation post-operatively. COMPARISON:  Preoperative radiograph yesterday. FINDINGS: Four fluoroscopic spot views of the left hip obtained in the operating room. Intramedullary nail with trans trochanteric and distal locking screw fixation traverse comminuted intertrochanteric  femur fracture. Total fluoroscopy time 1 minutes 11 seconds. Total dose 15.33 mGy. IMPRESSION: Procedural fluoroscopy during ORIF intertrochanteric left femur fracture. Electronically Signed   By: Keith Rake M.D.   On: 09/16/2020 17:53   DG HIP UNILAT WITH PELVIS 2-3 VIEWS LEFT  Result Date: 09/15/2020 CLINICAL DATA:  87 year old female with fall and left hip pain. EXAM: DG HIP (WITH OR WITHOUT PELVIS) 2-3V LEFT COMPARISON:  CT abdomen pelvis dated 07/25/2019. FINDINGS: There is a comminuted and displaced intertrochanteric fracture of the left femoral neck. There is no dislocation. The bones are osteopenic. There is a total right hip arthroplasty. The soft tissues are unremarkable. IMPRESSION: Comminuted and displaced intertrochanteric fracture of the left femoral neck. Electronically Signed   By: Anner Crete M.D.   On: 09/15/2020 22:31    DISCHARGE EXAMINATION: Vitals:   09/19/20 0003 09/19/20 0426 09/19/20 0815 09/19/20 0815  BP: (!) 146/96 (!) 145/64 125/77 125/77  Pulse: 60 65 66 61  Resp: 18 18 18 18   Temp: 98.4 F (36.9 C) 97.7 F (36.5 C) 99.1 F (37.3 C) 99.1 F (37.3 C)  TempSrc: Oral Oral    SpO2: 94% 99%  (!) 87%  Weight:      Height:       General appearance: Awake alert.  In no distress Resp: Clear to auscultation bilaterally.  Normal effort Cardio: S1-S2 is normal regular.  No S3-S4.  No rubs murmurs or bruit GI: Abdomen is soft.  Nontender nondistended.  Bowel sounds are present normal.  No masses organomegaly Extremities: No bleeding noted over the surgical site in the left thigh area   DISPOSITION: SNF  Discharge Instructions    Call MD for:  difficulty breathing, headache or visual disturbances   Complete by: As directed    Call MD for:  extreme fatigue   Complete by: As directed    Call MD for:  persistant dizziness or light-headedness   Complete by: As directed    Call MD for:  persistant nausea and vomiting   Complete by: As directed    Call MD  for:  severe uncontrolled pain   Complete by: As directed    Call MD for:  temperature >100.4   Complete by: As directed    Diet - low sodium heart healthy   Complete by: As directed    Discharge instructions   Complete by: As directed    Please review instructions on the discharge summary.  You were cared for by a hospitalist during your hospital stay. If you have any questions about your discharge medications or the care you received while you were in the hospital after you are discharged, you can call the unit and asked to speak with the hospitalist on call if the hospitalist that took care of you is not available. Once you are discharged, your primary care physician will handle any further medical issues. Please note that NO REFILLS for any discharge medications will be authorized once you are discharged, as it is imperative that you return to your primary care physician (or establish a relationship with a primary care physician if you do not have one) for your aftercare needs so that they can reassess your need for medications and monitor your lab values. If you do not have a primary care physician, you can call [phone] for a physician referral.   Increase activity slowly   Complete by: As directed    No wound care   Complete by: As directed          Allergies as of 09/19/2020      Reactions   Fluticasone-salmeterol Anaphylaxis   Albuterol    Other reaction(s): Other (See Comments) It burns and makes her cough "too strong for me or something"   Ciprofloxacin Other (See Comments)   Other reaction(s): Unknown Does not remember   Codeine Hives   Colchicine Diarrhea   Promethazine Other (See Comments)   Unable to speak. Loss muscle control (PHENGERAN)   Valacyclovir    Other reaction(s): Muscle Pain Could hardly move   Guaifenesin Palpitations   Sulfa Antibiotics Rash      Medication List    TAKE these medications   acetaminophen 650 MG CR tablet Commonly known as:  TYLENOL Take 650 mg by mouth every 8 (eight) hours as needed for pain.   apixaban 5 MG Tabs tablet Commonly known as: ELIQUIS Take 5 mg by mouth 2 (two) times daily.   atorvastatin 40 MG tablet Commonly known as: LIPITOR Take 40 mg by mouth every evening.   budesonide-formoterol 160-4.5 MCG/ACT inhaler Commonly known as: SYMBICORT Inhale 2 puffs into the lungs 2 (two) times daily.   clotrimazole 1 % cream Commonly known as: LOTRIMIN Apply 1 application topically 2 (two) times daily. Apply to toes of R foot   docusate sodium 100 MG capsule Commonly known as: COLACE Take 1 capsule (100 mg total) by mouth 2 (two) times daily.   eucerin cream Apply topically as needed for dry skin. Apply  to tops of feet   famotidine 20 MG tablet Commonly known as: PEPCID Take 20 mg by mouth 2 (two) times daily.   levothyroxine 112 MCG tablet Commonly known as: SYNTHROID Take 112 mcg by mouth daily before breakfast.   montelukast 10 MG tablet Commonly known as: SINGULAIR Take 10 mg by mouth at bedtime.   Omega-3 1000 MG Caps Take 1,000 mg by mouth daily.   Pazeo 0.7 % Soln Generic drug: Olopatadine HCl Apply 1 drop to eye at bedtime.   polyethylene glycol 17 g packet Commonly known as: MIRALAX / GLYCOLAX Take 17 g by mouth daily.   traMADol 50 MG tablet Commonly known as: ULTRAM Take 1 tablet (50 mg total) by mouth every 6 (six) hours as needed for moderate pain.   triamcinolone cream 0.1 % Commonly known as: KENALOG Apply 1 application topically 2 (two) times daily.   Vitamin D3 50 MCG (2000 UT) Tabs Take 2,000 Units by mouth daily.   Voltaren 1 % Gel Generic drug: diclofenac Sodium Apply 2 g topically daily as needed (pain).         Contact information for follow-up providers    Renee Harder, MD Follow up in 2 week(s).   Specialty: Orthopedic Surgery Contact information: [place] [address] [phone]            Contact  information for after-discharge care    Destination    [facility] Preferred SNF .   Service: Skilled Chiropodist information: [address] [place] [phone]                  TOTAL DISCHARGE TIME: 55 minutes  Glenn Dale  [facility] Pager on [email or web address]  09/19/2020, 9:03 AM

## 2020-09-19 NOTE — Progress Notes (Signed)
Pt states she needs someone to sit in her room with her. I explained we cant do that.. My anxiety is bad and its going to make me go into afib. Tried calming her down but she is staying anxious

## 2020-09-19 NOTE — TOC Transition Note (Signed)
Transition of Care [facility]) - CM/SW Discharge Note   Patient Details  Name: [name] MRN: [id number] Date of Birth: May 15, 1924  [facility] CM/SW Contact:  Pete Pelt, RN Phone Number: 09/19/2020, 12:47 PM   Clinical Narrative:   Patient discharged to [facility] today.  Tammy at [facility] aware, stated patient can go to [facility].  EMS states patient is 3rd on list.            Patient Goals and CMS Choice        Discharge Placement                       Discharge Plan and Services                                     Social Determinants of Health (SDOH) Interventions     Readmission Risk Interventions No flowsheet data found.

## 2020-09-19 NOTE — Plan of Care (Signed)
Ready to go to [facility]

## 2020-09-19 NOTE — Progress Notes (Signed)
Jiles Garter talked to admitting Rn on phone at [facility] and. She stated ok to call for transport because I couldn't get anyone to answer at [facility] .

## 2021-01-06 ENCOUNTER — Ambulatory Visit
Admission: EM | Admit: 2021-01-06 | Discharge: 2021-01-06 | Disposition: A | Discharge status: Home / Self Care | Payer: Medicare Other | Attending: Emergency Medicine | Admitting: Emergency Medicine

## 2021-01-06 ENCOUNTER — Other Ambulatory Visit: Payer: Self-pay

## 2021-01-06 DIAGNOSIS — M7121 Synovial cyst of popliteal space [Baker], right knee: Secondary | ICD-10-CM

## 2021-01-06 MED ORDER — TRAMADOL HCL 50 MG PO TABS: 50.0000 mg | ORAL_TABLET | Freq: Four times a day (QID) | ORAL | 0 refills | Status: DC | PRN

## 2021-01-06 MED ORDER — METHYLPREDNISOLONE 4 MG PO TBPK: ORAL_TABLET | ORAL | 0 refills | Status: DC

## 2021-01-06 NOTE — Discharge Instructions (Addendum)
Wear the Ace wrap during the day and remove it before bedtime.  This will provide compression and help the body reabsorb some the fluid in your Baker's cyst.  Keep your right leg elevated is much as possible to help reduce swelling and aid in pain relief.  Apply ice to the back of your knee for 20 minutes at a time 2-3 times a day to help shrink the Baker's cyst and aid in pain relief.  You can use regular Tylenol as needed for mild to moderate pain and use the tramadol as needed for severe pain.  Mind you this has the potential to make you dizzy so be cautious when changing positions or getting up from a laying position.  If your symptoms continue I recommend following up with orthopedics for further evaluation of your Baker's cyst and to discuss further treatment options.

## 2021-01-06 NOTE — ED Triage Notes (Signed)
Pt c/o pain in her right foot, that radiates up her leg to her hip area. Pt does have some redness and swelling to the right foot, pt was treated for tinea pedis recently. Pt states the pain is intense and she cannot walk.

## 2021-01-06 NOTE — ED Provider Notes (Signed)
[facility]    CSN: [id number] Arrival date & time: 01/06/21  1549      History   Chief Complaint Chief Complaint  Patient presents with   Foot Pain    right    HPI [name] is a 87 y.o. female.   HPI  87 year old female here for evaluation of right foot and leg pain.  Patient is here with her family and she is complaining of pain in her right foot that ascended to her right lower leg to the level of her knee and then up the back of her right leg into her hip.  This all started this morning.  She describes what sounds like possible restless leg symptoms last night before going to bed and then waking up with the pain this morning.  She does not complain of any numbness or tingling in her foot and she denies any known injury.  She does state the pain is so intense that she cannot walk.  She also reports having a fullness in the back of her right knee that hurts when she extends her leg and it causes tightness in the back of her right thigh.  Past Medical History:  Diagnosis Date   Arthritis    Asthma    Atrial fibrillation (HCC)    Cataract    Eczema    GERD (gastroesophageal reflux disease)    Gout of wrist    Heart murmur    Pacemaker    Pseudogout of ankle or foot    Thyroid disease    TIA (transient ischemic attack)     Patient Active Problem List   Diagnosis Date Noted   Closed left hip fracture ([place]) 09/15/2020   Acute appendicitis 07/25/2019   Leukocytosis 06/29/2016   Hypogammaglobulinemia ([place]) 06/29/2016   Temporary cerebral vascular dysfunction 02/28/2014   DD (diverticular disease) 02/28/2014   Allergic rhinitis 02/28/2014   Allergy status to sulfonamides 02/28/2014   History of anticoagulant therapy 02/28/2014   Arthritis 02/28/2014   Airway hyperreactivity 02/28/2014   A-fib ([place]) 02/28/2014   Bilateral cataracts 02/28/2014   Dermatitis, eczematoid 02/28/2014   Allergy to environmental factors 02/28/2014   Acid reflux  02/28/2014   Polypharmacy 02/28/2014   HLD (hyperlipidemia) 02/28/2014   Adult hypothyroidism 02/28/2014   BP (high blood pressure) 02/28/2014   Arthritis, degenerative 02/28/2014   Degenerative arthritis of hip 10/09/2013   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) 06/01/2012   Lumbar radiculopathy 06/01/2012   DDD (degenerative disc disease), lumbar 06/01/2012   H/O cardiac catheterization 10/20/2009    Past Surgical History:  Procedure Laterality Date   ABLATION     EYE SURGERY Bilateral    cataract   foot sugery     HIP ARTHROPLASTY     right   IMPLANTABLE CARDIOVERTER DEFIBRILLATOR (ICD) GENERATOR CHANGE Left 05/29/2019   Procedure: PACEMAKER CHANGE OUT;  Surgeon: Isaias Cowman, MD;  Location: [facility] ORS;  Service: Cardiovascular;  Laterality: Left;   INTRAMEDULLARY (IM) NAIL INTERTROCHANTERIC Left 09/16/2020   Procedure: INTRAMEDULLARY (IM) NAIL INTERTROCHANTRIC;  Surgeon: Renee Harder, MD;  Location: [facility] ORS;  Service: Orthopedics;  Laterality: Left;   PACEMAKER INSERTION      OB History   No obstetric history on file.      Home Medications  Prior to Admission medications   Medication Sig Start Date End Date Taking? Authorizing Provider  acetaminophen (TYLENOL) 650 MG CR tablet Take 650 mg by mouth every 8 (eight) hours as needed for pain.   Yes [provider]  apixaban (ELIQUIS) 5 MG TABS tablet Take 5 mg by mouth 2 (two) times daily.  06/26/13  Yes [provider]  atorvastatin (LIPITOR) 40 MG tablet Take 40 mg by mouth every evening.    Yes [provider]  budesonide-formoterol (SYMBICORT) 160-4.5 MCG/ACT inhaler Inhale 2 puffs into the lungs 2 (two) times daily.  09/09/13  Yes [provider]  Cholecalciferol  (VITAMIN D3) 50 MCG (2000 UT) TABS Take 2,000 Units by mouth daily.    Yes [provider]  clotrimazole (LOTRIMIN) 1 % cream Apply 1 application topically 2 (two) times daily. Apply to toes of R foot 07/07/20  Yes Cuthriell, Charline Bills, PA-C  diclofenac Sodium (VOLTAREN) 1 % GEL Apply 2 g topically daily as needed (pain).  08/22/13  Yes [provider]  docusate sodium (COLACE) 100 MG capsule Take 1 capsule (100 mg total) by mouth 2 (two) times daily. 09/19/20  Yes Bonnielee Haff, MD  famotidine (PEPCID) 20 MG tablet Take 20 mg by mouth 2 (two) times daily.    Yes [provider]  levothyroxine (SYNTHROID, LEVOTHROID) 112 MCG tablet Take 112 mcg by mouth daily before breakfast.   Yes [provider]  methylPREDNISolone (MEDROL DOSEPAK) 4 MG TBPK tablet Take according to the package insert. 01/06/21  Yes Margarette Canada, NP  montelukast (SINGULAIR) 10 MG tablet Take 10 mg by mouth at bedtime.  12/27/13  Yes [provider]  Olopatadine HCl 0.7 % SOLN Apply 1 drop to eye at bedtime.    Yes [provider]  Omega-3 1000 MG CAPS Take 1,000 mg by mouth daily.    Yes [provider]  polyethylene glycol (MIRALAX / GLYCOLAX) 17 g packet Take 17 g by mouth daily. 09/19/20  Yes Bonnielee Haff, MD  Skin Protectants, Misc. (EUCERIN) cream Apply topically as needed for dry skin. Apply to tops of feet 07/07/20  Yes Cuthriell, Charline Bills, PA-C  triamcinolone (KENALOG) 0.1 % Apply 1 application topically 2 (two) times daily. 07/14/20  Yes Margarette Canada, NP  traMADol (ULTRAM) 50 MG tablet Take 1 tablet (50 mg total) by mouth every 6 (six) hours as needed for moderate pain. 01/06/21   Margarette Canada, NP    Family History Family History  Problem Relation Age of Onset   Leukemia Father    Lung cancer Sister    Breast cancer Neg Hx     Social History Social History   Tobacco Use   Smoking status: Never   Smokeless tobacco: Never  Vaping Use   Vaping Use:  Never used  Substance Use Topics   Alcohol use: Not Currently    Alcohol/week: 0.0 standard drinks   Drug use: No     Allergies   Fluticasone-salmeterol, Albuterol, Ciprofloxacin, Codeine, Colchicine, Promethazine, Valacyclovir, Guaifenesin, and Sulfa antibiotics   Review of Systems Review of Systems  Constitutional:  Negative for activity change, appetite change and fever.  Musculoskeletal:  Positive for arthralgias and myalgias.  Skin:  Negative for wound.  Neurological:  Negative for weakness and numbness.  Hematological: Negative.   Psychiatric/Behavioral: Negative.      Physical Exam Triage Vital Signs ED Triage Vitals  Enc Vitals Group     BP 01/06/21 1623 (!) 140/104     Pulse  Rate 01/06/21 1623 72     Resp 01/06/21 1623 18     Temp 01/06/21 1623 99.3 F (37.4 C)     Temp Source 01/06/21 1623 Oral     SpO2 01/06/21 1623 99 %     Weight 01/06/21 1621 125 lb (56.7 kg)     Height 01/06/21 1621 '5\' 4"'$  (1.626 m)     Head Circumference --      Peak Flow --      Pain Score 01/06/21 1620 10     Pain Loc --      Pain Edu? --      Excl. in [place]? --    No data found.  Updated Vital Signs BP (!) 140/104 (BP Location: Left Arm)   Pulse 72   Temp 99.3 F (37.4 C) (Oral)   Resp 18   Ht '5\' 4"'$  (1.626 m)   Wt 125 lb (56.7 kg)   SpO2 99%   BMI 21.46 kg/m   Visual Acuity Right Eye Distance:   Left Eye Distance:   Bilateral Distance:    Right Eye Near:   Left Eye Near:    Bilateral Near:     Physical Exam Vitals and nursing note reviewed.  Constitutional:      General: She is not in acute distress.    Appearance: Normal appearance. She is normal weight. She is not ill-appearing.  Cardiovascular:     Rate and Rhythm: Normal rate and regular rhythm.     Pulses: Normal pulses.     Heart sounds: Normal heart sounds. No murmur heard.   No gallop.  Pulmonary:     Effort: Pulmonary effort is normal.     Breath sounds: Normal breath sounds. No wheezing, rhonchi or  rales.  Musculoskeletal:        General: Swelling and tenderness present. No deformity. Normal range of motion.  Skin:    General: Skin is warm and dry.     Capillary Refill: Capillary refill takes less than 2 seconds.     Findings: Erythema present.  Neurological:     General: No focal deficit present.     Mental Status: She is alert and oriented to person, place, and time.  Psychiatric:        Mood and Affect: Mood normal.        Behavior: Behavior normal.        Thought Content: Thought content normal.        Judgment: Judgment normal.     UC Treatments / Results  Labs (all labs ordered are listed, but only abnormal results are displayed) Labs Reviewed - No data to display  EKG   Radiology No results found.  Procedures Procedures (including critical care time)  Medications Ordered in UC Medications - No data to display  Initial Impression / Assessment and Plan / UC Course  I have reviewed the triage vital signs and the nursing notes.  Pertinent labs & imaging results that were available during my care of the patient were reviewed by me and considered in my medical decision making (see chart for details).  Patient is a nontoxic-appearing 87 year old female here for evaluation of pain in her right foot ankle that radiates up her leg to the level of her knee and then up the back of her thigh to her hip.  This all started this morning when she woke up.  She has not had any injuries that she is aware of and she denies any falls.  She  denies any numbness tingling and her foot or toes.  She states that she had a severe pain in her foot that went to her ankle and she says it felt like a rush of fluid that went up her leg to the back of her knee and then of the back of her thigh to her hip.  Patient's physical exam reveals a right leg that is in normal anatomical alignment.  DP and PT pulses are 2+.  There is scaly skin with erythematous base on the dorsum of the foot but it is not  hot, tender, or swollen.  Patient has full range of motion of her toes and there is no pain with palpation of the arch, midfoot, ankle, calcaneus, or Achilles.  Patient has no pain when palpating the muscles of her calf.  The right knee is also normal anatomical alignment and there is a marked fullness in the posterior aspect of her knee.  There is not a similar fullness on the left side.  Patient does have significant tightness in her hamstrings of her right thigh as well.  Suspect patient is suffering from nerve impingement secondary to a Baker's cyst.  We will treat patient conservatively with rest, ice, compression, and elevation.  We will also give patient a Medrol Dosepak to help with joint inflammation.  Short course of tramadol as patient has been prescribed this before for pain to bridge her until the steroids kick in.   Final Clinical Impressions(s) / UC Diagnoses   Final diagnoses:  Baker's cyst of knee, right     Discharge Instructions      Wear the Ace wrap during the day and remove it before bedtime.  This will provide compression and help the body reabsorb some the fluid in your Baker's cyst.  Keep your right leg elevated is much as possible to help reduce swelling and aid in pain relief.  Apply ice to the back of your knee for 20 minutes at a time 2-3 times a day to help shrink the Baker's cyst and aid in pain relief.  You can use regular Tylenol as needed for mild to moderate pain and use the tramadol as needed for severe pain.  Mind you this has the potential to make you dizzy so be cautious when changing positions or getting up from a laying position.  If your symptoms continue I recommend following up with orthopedics for further evaluation of your Baker's cyst and to discuss further treatment options.     ED Prescriptions     Medication Sig Dispense Auth. Provider   traMADol (ULTRAM) 50 MG tablet Take 1 tablet (50 mg total) by mouth every 6 (six) hours as needed for  moderate pain. 15 tablet Margarette Canada, NP   methylPREDNISolone (MEDROL DOSEPAK) 4 MG TBPK tablet Take according to the package insert. 1 each Margarette Canada, NP      I have reviewed the PDMP during this encounter.   Margarette Canada, NP 01/06/21 1708

## 2021-03-26 ENCOUNTER — Other Ambulatory Visit: Payer: Self-pay

## 2021-03-26 ENCOUNTER — Ambulatory Visit (INDEPENDENT_AMBULATORY_CARE_PROVIDER_SITE_OTHER): Payer: Medicare Other

## 2021-03-26 ENCOUNTER — Ambulatory Visit
Admission: EM | Admit: 2021-03-26 | Discharge: 2021-03-26 | Disposition: A | Discharge status: Home / Self Care | Payer: Medicare Other | Attending: Emergency Medicine | Admitting: Emergency Medicine

## 2021-03-26 DIAGNOSIS — L03114 Cellulitis of left upper limb: Secondary | ICD-10-CM | POA: Diagnosis not present

## 2021-03-26 DIAGNOSIS — M25532 Pain in left wrist: Secondary | ICD-10-CM

## 2021-03-26 MED ORDER — CEPHALEXIN 500 MG PO CAPS: 500.0000 mg | ORAL_CAPSULE | Freq: Four times a day (QID) | ORAL | 0 refills | Status: DC

## 2021-03-26 NOTE — ED Triage Notes (Signed)
Pt here with C/O swollen, red and warm left wrist this morning. Called EMS this morning and was advised to  get wrist checked out.

## 2021-03-26 NOTE — Discharge Instructions (Addendum)
Cont to take tylenol  Due to the interaction of steroids and your medications we are not able to give those.  Keep wrist elevated and apply warm compress as needed

## 2021-03-26 NOTE — ED Provider Notes (Signed)
[facility]    CSN: [id number] Arrival date & time: 03/26/21  1206      History   Chief Complaint Chief Complaint  Patient presents with   Wrist Pain    left    HPI [name] is a 87 y.o. female.   Pt states yesterday noticed her lt wrist beginning to have swelling, pain and redness. Denies any known injury. Has arthritis in her wrist. Take tylenol and that is not helping. Denies any fevers.  No n/v/d. EMS was called to the home but they did not feel pt was in need to go to er.    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/2016   Temporary cerebral vascular dysfunction 02/28/2014   DD (diverticular disease) 02/28/2014   Allergic rhinitis 02/28/2014   Allergy status to sulfonamides 02/28/2014   History of anticoagulant therapy 02/28/2014   Arthritis 02/28/2014   Airway hyperreactivity 02/28/2014   A-fib ([place]) 02/28/2014   Bilateral cataracts 02/28/2014   Dermatitis, eczematoid 02/28/2014   Allergy to environmental factors 02/28/2014   Acid reflux 02/28/2014   Polypharmacy 02/28/2014   HLD (hyperlipidemia) 02/28/2014   Adult hypothyroidism 02/28/2014   BP (high blood pressure) 02/28/2014   Arthritis, degenerative 02/28/2014   Degenerative arthritis of hip 10/09/2013   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   Neck rigid 05/30/2013   1st degree AV block 10/10/2012   History of biliary T-tube placement 06/14/2012   History of cardioversion 06/05/2012   Avascular necrosis of femoral  head ([place]) 06/01/2012   Lumbar radiculopathy 06/01/2012   DDD (degenerative disc disease), lumbar 06/01/2012   H/O cardiac catheterization 10/20/2009    Past Surgical History:  Procedure Laterality Date   ABLATION     EYE SURGERY Bilateral    cataract   foot sugery     HIP ARTHROPLASTY     right   IMPLANTABLE CARDIOVERTER DEFIBRILLATOR (ICD) GENERATOR CHANGE Left 05/29/2019   Procedure: PACEMAKER CHANGE OUT;  Surgeon: Isaias Cowman, MD;  Location: [facility] ORS;  Service: Cardiovascular;  Laterality: Left;   INTRAMEDULLARY (IM) NAIL INTERTROCHANTERIC Left 09/16/2020   Procedure: INTRAMEDULLARY (IM) NAIL INTERTROCHANTRIC;  Surgeon: Renee Harder, MD;  Location: [facility] ORS;  Service: Orthopedics;  Laterality: Left;   PACEMAKER INSERTION      OB History   No obstetric history on file.      Home Medications    Prior to Admission medications   Medication Sig Start Date End Date Taking? Authorizing Provider  acetaminophen (TYLENOL) 650 MG CR tablet Take 650 mg by mouth every 8 (eight) hours as needed for pain.   Yes [provider]  apixaban (ELIQUIS) 5 MG TABS tablet Take 5 mg by mouth 2 (two) times daily.  06/26/13  Yes [provider]  atorvastatin (LIPITOR) 40 MG tablet Take 40 mg by mouth every evening.    Yes [provider]  budesonide-formoterol (SYMBICORT) 160-4.5 MCG/ACT inhaler Inhale 2  puffs into the lungs 2 (two) times daily.  09/09/13  Yes [provider]  cephALEXin (KEFLEX) 500 MG capsule Take 1 capsule (500 mg total) by mouth 4 (four) times daily. 03/26/21  Yes Marney Setting, NP  Cholecalciferol (VITAMIN D3) 50 MCG (2000 UT) TABS Take 2,000 Units by mouth daily.    Yes [provider]  clotrimazole (LOTRIMIN) 1 % cream Apply 1 application topically 2 (two) times daily. Apply to toes of R foot 07/07/20  Yes Cuthriell, Charline Bills, PA-C  diclofenac Sodium (VOLTAREN) 1 % GEL Apply 2 g topically daily as needed (pain).   08/22/13  Yes [provider]  docusate sodium (COLACE) 100 MG capsule Take 1 capsule (100 mg total) by mouth 2 (two) times daily. 09/19/20  Yes Bonnielee Haff, MD  famotidine (PEPCID) 20 MG tablet Take 20 mg by mouth 2 (two) times daily.    Yes [provider]  levothyroxine (SYNTHROID, LEVOTHROID) 112 MCG tablet Take 112 mcg by mouth daily before breakfast.   Yes [provider]  methylPREDNISolone (MEDROL DOSEPAK) 4 MG TBPK tablet Take according to the package insert. 01/06/21  Yes Margarette Canada, NP  montelukast (SINGULAIR) 10 MG tablet Take 10 mg by mouth at bedtime.  12/27/13  Yes [provider]  Olopatadine HCl 0.7 % SOLN Apply 1 drop to eye at bedtime.    Yes [provider]  Omega-3 1000 MG CAPS Take 1,000 mg by mouth daily.    Yes [provider]  polyethylene glycol (MIRALAX / GLYCOLAX) 17 g packet Take 17 g by mouth daily. 09/19/20  Yes Bonnielee Haff, MD  Skin Protectants, Misc. (EUCERIN) cream Apply topically as needed for dry skin. Apply to tops of feet 07/07/20  Yes Cuthriell, Charline Bills, PA-C  traMADol (ULTRAM) 50 MG tablet Take 1 tablet (50 mg total) by mouth every 6 (six) hours as needed for moderate pain. 01/06/21  Yes Margarette Canada, NP  triamcinolone (KENALOG) 0.1 % Apply 1 application topically 2 (two) times daily. 07/14/20  Yes Margarette Canada, NP    Family History Family History  Problem Relation Age of Onset   Leukemia Father    Lung cancer Sister    Breast cancer Neg Hx     Social History Social History   Tobacco Use   Smoking status: Never   Smokeless tobacco: Never  Vaping Use   Vaping Use: Never used  Substance Use Topics   Alcohol use: Not Currently    Alcohol/week: 0.0 standard drinks   Drug use: No     Allergies   Fluticasone-salmeterol, Albuterol, Ciprofloxacin, Codeine, Colchicine, Promethazine, Valacyclovir, Guaifenesin, and Sulfa antibiotics   Review of Systems Review of Systems  Constitutional:  Negative.  Negative for activity change, chills, fatigue and fever.  Respiratory: Negative.    Cardiovascular: Negative.   Gastrointestinal: Negative.   Genitourinary: Negative.   Musculoskeletal:  Positive for joint swelling.  Skin:        Redness to lt wrist area with some swelling, feels warm   Neurological: Negative.     Physical Exam Triage Vital Signs ED Triage Vitals  Enc Vitals Group     BP 03/26/21 1317 124/60     Pulse Rate 03/26/21 1317 96     Resp 03/26/21 1317 18     Temp 03/26/21 1317 99.1 F (37.3 C)     Temp Source 03/26/21 1317 Oral     SpO2 03/26/21 1317 97 %     Weight 03/26/21 1316 125 lb (  56.7 kg)     Height 03/26/21 1316 5\' 4"  (1.626 m)     Head Circumference --      Peak Flow --      Pain Score 03/26/21 1316 5     Pain Loc --      Pain Edu? --      Excl. in [place]? --    No data found.  Updated Vital Signs BP 124/60 (BP Location: Left Arm)   Pulse 96   Temp 99.1 F (37.3 C) (Oral)   Resp 18   Ht 5\' 4"  (1.626 m)   Wt 125 lb (56.7 kg)   SpO2 97%   BMI 21.46 kg/m   Visual Acuity Right Eye Distance:   Left Eye Distance:   Bilateral Distance:    Right Eye Near:   Left Eye Near:    Bilateral Near:     Physical Exam Constitutional:      Appearance: Normal appearance. She is normal weight.  Cardiovascular:     Rate and Rhythm: Normal rate.  Pulmonary:     Effort: Pulmonary effort is normal.  Abdominal:     General: Abdomen is flat.  Musculoskeletal:        General: Swelling and tenderness present. No deformity or signs of injury.     Comments: Decrease rom to lt wrist area due to pain. Slight edema with erythema noted. Not enough fluid to have drainage.   Skin:    General: Skin is warm.     Findings: Erythema present. No bruising.  Neurological:     Mental Status: She is alert.     UC Treatments / Results  Labs (all labs ordered are listed, but only abnormal results are displayed) Labs Reviewed - No data to  display  EKG   Radiology DG Wrist Complete Left  Result Date: 03/26/2021 CLINICAL DATA:  Swelling, pain, erythema EXAM: LEFT WRIST - COMPLETE 3+ VIEW COMPARISON:  None. FINDINGS: There is no evidence of fracture or dislocation. The carpus is normally aligned. Mild arthrosis of the thumb carpometacarpal joint. Joint spaces are otherwise preserved. Soft tissues are unremarkable. IMPRESSION: No fracture or dislocation of the left wrist. The carpus is normally aligned. Electronically Signed   By: Delanna Ahmadi M.D.   On: 03/26/2021 13:46    Procedures Procedures (including critical care time)  Medications Ordered in UC Medications - No data to display  Initial Impression / Assessment and Plan / UC Course  I have reviewed the triage vital signs and the nursing notes.  Pertinent labs & imaging results that were available during my care of the patient were reviewed by me and considered in my medical decision making (see chart for details).     X ray normal  Slight edema concern for fluid will treat with abx will need to see ortho if pain continues.  Go to er if pain increase or redness spreads with in 24 hours  Not able to give steroids or nsaids due to is of interaction with previous meds  Final Clinical Impressions(s) / UC Diagnoses   Final diagnoses:  Left wrist pain  Cellulitis of left upper extremity     Discharge Instructions      Cont to take tylenol  Due to the interaction of steroids and your medications we are not able to give those.  Keep wrist elevated and apply warm compress as needed      ED Prescriptions     Medication Sig Dispense Auth. Provider   cephALEXin (  KEFLEX) 500 MG capsule Take 1 capsule (500 mg total) by mouth 4 (four) times daily. 20 capsule Marney Setting, NP      PDMP not reviewed this encounter.   Marney Setting, NP 03/26/21 1400

## 2021-04-05 ENCOUNTER — Ambulatory Visit
Admission: EM | Admit: 2021-04-05 | Discharge: 2021-04-05 | Disposition: A | Discharge status: Home / Self Care | Payer: Medicare Other | Attending: Emergency Medicine | Admitting: Emergency Medicine

## 2021-04-05 ENCOUNTER — Other Ambulatory Visit: Payer: Self-pay

## 2021-04-05 ENCOUNTER — Ambulatory Visit (INDEPENDENT_AMBULATORY_CARE_PROVIDER_SITE_OTHER): Payer: Medicare Other

## 2021-04-05 DIAGNOSIS — M25561 Pain in right knee: Secondary | ICD-10-CM

## 2021-04-05 MED ORDER — PREDNISONE 10 MG (21) PO TBPK: ORAL_TABLET | ORAL | 0 refills | Status: DC

## 2021-04-05 NOTE — ED Provider Notes (Signed)
[facility]    CSN: [id number] Arrival date & time: 04/05/21  1905      History   Chief Complaint Chief Complaint  Patient presents with   Knee Pain    HPI Lyndel Dancel is a 87 y.o. female.   HPI  87 year old female here for evaluation of right knee pain.  Patient reports that her right knee has been painful and swollen since she woke up this morning.  She does not remember any injury and her caregiver does not member any falls.  She states that she has a history of bursitis in that knee and this feels similar to her bursitis.  Patient has swelling and pain all throughout her knee and it hurts to extend and flex her knee in addition to having pain with bearing weight.  There is no redness, heat, or bruising present in her knee and she denies any numbness or tingling in her lower leg or foot.  Past Medical History:  Diagnosis Date   Arthritis    Asthma    Atrial fibrillation (HCC)    Cataract    Eczema    GERD (gastroesophageal reflux disease)    Gout of wrist    Heart murmur    Pacemaker    Pseudogout of ankle or foot    Thyroid disease    TIA (transient ischemic attack)     Patient Active Problem List   Diagnosis Date Noted   Closed left hip fracture ([place]) 09/15/2020   Acute appendicitis 07/25/2019   Leukocytosis 06/29/2016   Hypogammaglobulinemia ([place]) 06/29/2016   Temporary cerebral vascular dysfunction 02/28/2014   DD (diverticular disease) 02/28/2014   Allergic rhinitis 02/28/2014   Allergy status to sulfonamides 02/28/2014   History of anticoagulant therapy 02/28/2014   Arthritis 02/28/2014   Airway hyperreactivity 02/28/2014   A-fib ([place]) 02/28/2014   Bilateral cataracts 02/28/2014   Dermatitis, eczematoid 02/28/2014   Allergy to environmental factors 02/28/2014   Acid reflux 02/28/2014   Polypharmacy 02/28/2014   HLD (hyperlipidemia) 02/28/2014   Adult hypothyroidism 02/28/2014   BP (high blood pressure) 02/28/2014    Arthritis, degenerative 02/28/2014   Degenerative arthritis of hip 10/09/2013   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   Neck rigid 05/30/2013   1st degree AV block 10/10/2012   History of biliary T-tube placement 06/14/2012   History of cardioversion 06/05/2012   Avascular necrosis of femoral head ([place]) 06/01/2012   Lumbar radiculopathy 06/01/2012   DDD (degenerative disc disease), lumbar 06/01/2012   H/O cardiac catheterization 10/20/2009    Past Surgical History:  Procedure Laterality Date   ABLATION     EYE SURGERY Bilateral    cataract   foot sugery     HIP ARTHROPLASTY     right   IMPLANTABLE CARDIOVERTER DEFIBRILLATOR (ICD) GENERATOR CHANGE Left 05/29/2019   Procedure: PACEMAKER CHANGE OUT;  Surgeon: Isaias Cowman, MD;  Location: [facility] ORS;  Service: Cardiovascular;  Laterality: Left;   INTRAMEDULLARY (IM) NAIL INTERTROCHANTERIC Left 09/16/2020   Procedure: INTRAMEDULLARY (IM) NAIL INTERTROCHANTRIC;  Surgeon: Renee Harder, MD;  Location: [facility] ORS;  Service: Orthopedics;  Laterality: Left;   PACEMAKER INSERTION      OB History   No obstetric history on file.      Home Medications    Prior to Admission medications   Medication Sig Start Date End Date Taking? Authorizing Provider  predniSONE (STERAPRED UNI-PAK 21 TAB) 10 MG (21) TBPK tablet Take 6 tablets on day 1, 5 tablets day 2, 4  tablets day 3, 3 tablets day 4, 2 tablets day 5, 1 tablet day 6 04/05/21  Yes Margarette Canada, NP  acetaminophen (TYLENOL) 650 MG CR tablet Take 650 mg by mouth every 8 (eight) hours as needed for pain.    [provider]  apixaban (ELIQUIS) 5 MG TABS tablet Take 5 mg by mouth 2 (two) times daily.  06/26/13   [provider]  atorvastatin (LIPITOR) 40 MG tablet Take 40 mg by mouth every evening.     [provider]  budesonide-formoterol (SYMBICORT) 160-4.5 MCG/ACT inhaler Inhale 2 puffs into the lungs 2 (two) times daily.  09/09/13    [provider]  cephALEXin (KEFLEX) 500 MG capsule Take 1 capsule (500 mg total) by mouth 4 (four) times daily. 03/26/21   Marney Setting, NP  Cholecalciferol (VITAMIN D3) 50 MCG (2000 UT) TABS Take 2,000 Units by mouth daily.     [provider]  clotrimazole (LOTRIMIN) 1 % cream Apply 1 application topically 2 (two) times daily. Apply to toes of R foot 07/07/20   Cuthriell, Charline Bills, PA-C  diclofenac Sodium (VOLTAREN) 1 % GEL Apply 2 g topically daily as needed (pain).  08/22/13   [provider]  docusate sodium (COLACE) 100 MG capsule Take 1 capsule (100 mg total) by mouth 2 (two) times daily. 09/19/20   Bonnielee Haff, MD  famotidine (PEPCID) 20 MG tablet Take 20 mg by mouth 2 (two) times daily.     [provider]  levothyroxine (SYNTHROID, LEVOTHROID) 112 MCG tablet Take 112 mcg by mouth daily before breakfast.    [provider]  montelukast (SINGULAIR) 10 MG tablet Take 10 mg by mouth at bedtime.  12/27/13   [provider]  Olopatadine HCl 0.7 % SOLN Apply 1 drop to eye at bedtime.     [provider]  Omega-3 1000 MG CAPS Take 1,000 mg by mouth daily.     [provider]  polyethylene glycol (MIRALAX / GLYCOLAX) 17 g packet Take 17 g by mouth daily. 09/19/20   Bonnielee Haff, MD  Skin Protectants, Misc. (EUCERIN) cream Apply topically as needed for dry skin. Apply to tops of feet 07/07/20   Cuthriell, Charline Bills, PA-C  traMADol (ULTRAM) 50 MG tablet Take 1 tablet (50 mg total) by mouth every 6 (six) hours as needed for moderate pain. 01/06/21   Margarette Canada, NP  triamcinolone (KENALOG) 0.1 % Apply 1 application topically 2 (two) times daily. 07/14/20   Margarette Canada, NP    Family History Family History  Problem Relation Age of Onset   Leukemia Father    Lung cancer Sister    Breast cancer Neg Hx     Social History Social History   Tobacco Use   Smoking status: Never   Smokeless tobacco: Never  Vaping Use    Vaping Use: Never used  Substance Use Topics   Alcohol use: Not Currently    Alcohol/week: 0.0 standard drinks   Drug use: No     Allergies   Fluticasone-salmeterol, Albuterol, Ciprofloxacin, Codeine, Colchicine, Other, Promethazine, Valacyclovir, Guaifenesin, and Sulfa antibiotics   Review of Systems Review of Systems  Constitutional:  Negative for activity change, appetite change and fever.  Musculoskeletal:  Positive for arthralgias and joint swelling. Negative for myalgias.  Skin:  Negative for color change and wound.  Neurological:  Negative for weakness and numbness.  Hematological: Negative.   Psychiatric/Behavioral: Negative.      Physical Exam Triage Vital Signs ED Triage  Vitals  Enc Vitals Group     BP 04/05/21 1929 (!) 122/51     Pulse Rate 04/05/21 1929 61     Resp 04/05/21 1929 18     Temp 04/05/21 1929 98.7 F (37.1 C)     Temp Source 04/05/21 1929 Oral     SpO2 04/05/21 1929 98 %     Weight 04/05/21 1928 125 lb (56.7 kg)     Height 04/05/21 1928 5\' 4"  (1.626 m)     Head Circumference --      Peak Flow --      Pain Score 04/05/21 1928 5     Pain Loc --      Pain Edu? --      Excl. in [place]? --    No data found.  Updated Vital Signs BP (!) 122/51 (BP Location: Right Arm)   Pulse 61   Temp 98.7 F (37.1 C) (Oral)   Resp 18   Ht 5\' 4"  (1.626 m)   Wt 125 lb (56.7 kg)   SpO2 98%   BMI 21.46 kg/m   Visual Acuity Right Eye Distance:   Left Eye Distance:   Bilateral Distance:    Right Eye Near:   Left Eye Near:    Bilateral Near:     Physical Exam Vitals and nursing note reviewed.  Constitutional:      General: She is not in acute distress.    Appearance: Normal appearance. She is not ill-appearing.  HENT:     Head: Normocephalic and atraumatic.  Musculoskeletal:        General: Swelling and tenderness present. No deformity.  Skin:    General: Skin is warm and dry.     Capillary Refill: Capillary refill takes less than 2 seconds.      Findings: No bruising or erythema.  Neurological:     General: No focal deficit present.     Mental Status: She is alert and oriented to person, place, and time.     Sensory: No sensory deficit.     Motor: No weakness.  Psychiatric:        Mood and Affect: Mood normal.        Behavior: Behavior normal.        Thought Content: Thought content normal.        Judgment: Judgment normal.     UC Treatments / Results  Labs (all labs ordered are listed, but only abnormal results are displayed) Labs Reviewed - No data to display  EKG   Radiology DG Knee Complete 4 Views Right  Result Date: 04/05/2021 CLINICAL DATA:  Right knee pain and swelling. EXAM: RIGHT KNEE - COMPLETE 4+ VIEW COMPARISON:  None. FINDINGS: There is no acute fracture or dislocation. The bones are osteopenic there is mild arthritic changes of the lateral compartment. Moderate joint effusion noted. The soft tissues are grossly unremarkable. IMPRESSION: 1. No acute fracture or dislocation. 2. Arthritic changes of the lateral compartment and moderate suprapatellar effusion. Electronically Signed   By: Anner Crete M.D.   On: 04/05/2021 20:35    Procedures Procedures (including critical care time)  Medications Ordered in UC Medications - No data to display  Initial Impression / Assessment and Plan / UC Course  I have reviewed the triage vital signs and the nursing notes.  Pertinent labs & imaging results that were available during my care of the patient were reviewed by me and considered in my medical decision making (see chart for details).  Patient  is a nontoxic-appearing 87 year old female here for evaluation of right knee pain that has been going on since she woke up this morning.  She does she thought it might have been a muscle cramp so she increased her water consumption and she is also been applying heat and cold to her knee without any relief.  She has a history of bursitis in the past that was treated with  steroids and this feels similar.  On physical exam the right knee is in normal anatomical alignment but there is swelling where the fibula and tibia meet, swelling over the tibial tuberosity, and swelling and tenderness to the medial and lateral joint lines.  There is also swelling to the popliteal fossa.  Patient does have a history of Baker's cyst in the past.  There is no ecchymosis or erythema noted.  When attempting to passively extend patient's leg I cannot get her to full extension secondary to pain and when returning her knee to 90 degrees she is also experiencing significant amounts of pain.  Due to the inability to get her to full extension I am unable to check varus and valgus stress.  Will obtain radiographs of right knee to rule out bony derangement.  If her x-rays are negative we will treat her for bursitis with a steroid Dosepak.  Right knee films independently reviewed and evaluated by me.  Impression: Patient is significant amount of degenerative changes with bone-on-bone with femur to tibia and also patella the femur.  There are also atherosclerotic vessels throughout the posterior knee.  There is a radiopaque lucency in the popliteal fossa with smooth edges and does not appear to be acute.  Radiology overread is pending. Radiology impression is no acute fracture or dislocation.  Arthritic changes of the lateral compartment and moderate suprapatellar effusion.  Will discharge patient home with diagnosis of right knee pain and treat her with a steroid Dosepak.   Final Clinical Impressions(s) / UC Diagnoses   Final diagnoses:  Acute pain of right knee     Discharge Instructions      Take the prednisone Dosepak starting tomorrow as needed for pain and inflammation.  You may continue to apply ice or moist heat to your right knee as needed for pain relief.  You may also use over-the-counter Tylenol according to package instructions as needed for pain.  Return for reevaluation, or  see your primary care provider, for new or worsening symptoms.     ED Prescriptions     Medication Sig Dispense Auth. Provider   predniSONE (STERAPRED UNI-PAK 21 TAB) 10 MG (21) TBPK tablet Take 6 tablets on day 1, 5 tablets day 2, 4 tablets day 3, 3 tablets day 4, 2 tablets day 5, 1 tablet day 6 21 tablet Margarette Canada, NP      PDMP not reviewed this encounter.   Margarette Canada, NP 04/05/21 2040

## 2021-04-05 NOTE — Discharge Instructions (Addendum)
Take the prednisone Dosepak starting tomorrow as needed for pain and inflammation.  You may continue to apply ice or moist heat to your right knee as needed for pain relief.  You may also use over-the-counter Tylenol according to package instructions as needed for pain.  Return for reevaluation, or see your primary care provider, for new or worsening symptoms.

## 2021-04-05 NOTE — ED Triage Notes (Signed)
Pt here with C/O back of right knee pain since waking up this morning. Pt woke up this morning and was unable to put weight on right leg. Felt like lots of cramps all over leg, applied heat and cold with no relief.

## 2021-04-19 ENCOUNTER — Other Ambulatory Visit: Payer: Self-pay

## 2021-04-19 ENCOUNTER — Encounter: Payer: Self-pay | Admitting: Emergency Medicine

## 2021-04-19 ENCOUNTER — Emergency Department
Admission: EM | Admit: 2021-04-19 | Discharge: 2021-04-19 | Disposition: A | Discharge status: Home / Self Care | Payer: Medicare Other | Attending: Emergency Medicine | Admitting: Emergency Medicine

## 2021-04-19 DIAGNOSIS — R531 Weakness: Secondary | ICD-10-CM | POA: Diagnosis not present

## 2021-04-19 DIAGNOSIS — Z5321 Procedure and treatment not carried out due to patient leaving prior to being seen by health care provider: Secondary | ICD-10-CM | POA: Insufficient documentation

## 2021-04-19 LAB — CBC
HCT: 17.9 % — ABNORMAL LOW (ref 36.0–46.0)
Hemoglobin: 5.3 g/dL — ABNORMAL LOW (ref 12.0–15.0)
MCH: 19.7 pg — ABNORMAL LOW (ref 26.0–34.0)
MCHC: 29.6 g/dL — ABNORMAL LOW (ref 30.0–36.0)
MCV: 66.5 fL — ABNORMAL LOW (ref 80.0–100.0)
Platelets: 319 10*3/uL (ref 150–400)
RBC: 2.69 MIL/uL — ABNORMAL LOW (ref 3.87–5.11)
RDW: 18.7 % — ABNORMAL HIGH (ref 11.5–15.5)
WBC: 13.4 10*3/uL — ABNORMAL HIGH (ref 4.0–10.5)
nRBC: 0 % (ref 0.0–0.2)

## 2021-04-19 LAB — BASIC METABOLIC PANEL
Anion gap: 9 (ref 5–15)
BUN: 15 mg/dL (ref 8–23)
CO2: 24 mmol/L (ref 22–32)
Calcium: 8.3 mg/dL — ABNORMAL LOW (ref 8.9–10.3)
Chloride: 93 mmol/L — ABNORMAL LOW (ref 98–111)
Creatinine, Ser: 0.49 mg/dL (ref 0.44–1.00)
GFR, Estimated: >60 mL/min (ref >60)
Glucose, Bld: 108 mg/dL — ABNORMAL HIGH (ref 70–99)
Potassium: 3.7 mmol/L (ref 3.5–5.1)
Sodium: 126 mmol/L — ABNORMAL LOW (ref 135–145)

## 2021-04-19 NOTE — ED Notes (Signed)
Pt left the ED before labs were reviewed, attempted to call the home phone with no answer and no answering machine

## 2021-04-19 NOTE — ED Notes (Signed)
Sent rainbow to the lab. 

## 2021-04-19 NOTE — ED Triage Notes (Signed)
Presents with weakness via EMS    per EMS she has had generalized weakness for about 1 1/2 weeks   denies any fever or n/v    has been unsteady on feet

## 2021-04-22 ENCOUNTER — Telehealth: Payer: Self-pay | Admitting: Emergency Medicine

## 2021-04-22 NOTE — Telephone Encounter (Signed)
Called patient due to left emergency department before provider exam to inquire about condition and follow up plans. She says she continues to have the weakness--especially at the end of the day.  I asked who pcp is and she is not sure.  I told her that some labs are concerning.  She is not bleeding at this time.  I told her she can call her doctor, but would l ikely be sent back here.  She agrees to return.

## 2021-04-23 ENCOUNTER — Other Ambulatory Visit: Payer: Self-pay

## 2021-04-23 ENCOUNTER — Emergency Department: Payer: Medicare Other

## 2021-04-23 ENCOUNTER — Inpatient Hospital Stay
Admission: EM | Admit: 2021-04-23 | Discharge: 2021-04-27 | DRG: 375 | Disposition: A | Discharge status: Home Health | Payer: Medicare Other | Attending: Internal Medicine | Admitting: Internal Medicine

## 2021-04-23 DIAGNOSIS — M1990 Unspecified osteoarthritis, unspecified site: Secondary | ICD-10-CM | POA: Diagnosis present

## 2021-04-23 DIAGNOSIS — G459 Transient cerebral ischemic attack, unspecified: Secondary | ICD-10-CM | POA: Diagnosis not present

## 2021-04-23 DIAGNOSIS — Z7901 Long term (current) use of anticoagulants: Secondary | ICD-10-CM

## 2021-04-23 DIAGNOSIS — D509 Iron deficiency anemia, unspecified: Secondary | ICD-10-CM | POA: Diagnosis not present

## 2021-04-23 DIAGNOSIS — Z7951 Long term (current) use of inhaled steroids: Secondary | ICD-10-CM

## 2021-04-23 DIAGNOSIS — M109 Gout, unspecified: Secondary | ICD-10-CM | POA: Diagnosis present

## 2021-04-23 DIAGNOSIS — I509 Heart failure, unspecified: Secondary | ICD-10-CM | POA: Diagnosis present

## 2021-04-23 DIAGNOSIS — D62 Acute posthemorrhagic anemia: Secondary | ICD-10-CM | POA: Diagnosis present

## 2021-04-23 DIAGNOSIS — K219 Gastro-esophageal reflux disease without esophagitis: Secondary | ICD-10-CM | POA: Diagnosis present

## 2021-04-23 DIAGNOSIS — J449 Chronic obstructive pulmonary disease, unspecified: Secondary | ICD-10-CM | POA: Diagnosis present

## 2021-04-23 DIAGNOSIS — Z20822 Contact with and (suspected) exposure to covid-19: Secondary | ICD-10-CM | POA: Diagnosis present

## 2021-04-23 DIAGNOSIS — K921 Melena: Secondary | ICD-10-CM | POA: Diagnosis present

## 2021-04-23 DIAGNOSIS — K922 Gastrointestinal hemorrhage, unspecified: Secondary | ICD-10-CM | POA: Diagnosis not present

## 2021-04-23 DIAGNOSIS — K5900 Constipation, unspecified: Secondary | ICD-10-CM

## 2021-04-23 DIAGNOSIS — Z888 Allergy status to other drugs, medicaments and biological substances status: Secondary | ICD-10-CM

## 2021-04-23 DIAGNOSIS — Z885 Allergy status to narcotic agent status: Secondary | ICD-10-CM | POA: Diagnosis not present

## 2021-04-23 DIAGNOSIS — Z8673 Personal history of transient ischemic attack (TIA), and cerebral infarction without residual deficits: Secondary | ICD-10-CM

## 2021-04-23 DIAGNOSIS — E039 Hypothyroidism, unspecified: Secondary | ICD-10-CM | POA: Diagnosis present

## 2021-04-23 DIAGNOSIS — I110 Hypertensive heart disease with heart failure: Secondary | ICD-10-CM | POA: Diagnosis present

## 2021-04-23 DIAGNOSIS — I10 Essential (primary) hypertension: Secondary | ICD-10-CM | POA: Diagnosis present

## 2021-04-23 DIAGNOSIS — Z539 Procedure and treatment not carried out, unspecified reason: Secondary | ICD-10-CM | POA: Diagnosis present

## 2021-04-23 DIAGNOSIS — K6389 Other specified diseases of intestine: Secondary | ICD-10-CM

## 2021-04-23 DIAGNOSIS — Z95810 Presence of automatic (implantable) cardiac defibrillator: Secondary | ICD-10-CM

## 2021-04-23 DIAGNOSIS — I482 Chronic atrial fibrillation, unspecified: Secondary | ICD-10-CM | POA: Diagnosis present

## 2021-04-23 DIAGNOSIS — E871 Hypo-osmolality and hyponatremia: Secondary | ICD-10-CM | POA: Insufficient documentation

## 2021-04-23 DIAGNOSIS — Z96641 Presence of right artificial hip joint: Secondary | ICD-10-CM | POA: Diagnosis present

## 2021-04-23 DIAGNOSIS — J4520 Mild intermittent asthma, uncomplicated: Secondary | ICD-10-CM

## 2021-04-23 DIAGNOSIS — Z79890 Hormone replacement therapy (postmenopausal): Secondary | ICD-10-CM | POA: Diagnosis not present

## 2021-04-23 DIAGNOSIS — Z79899 Other long term (current) drug therapy: Secondary | ICD-10-CM | POA: Diagnosis not present

## 2021-04-23 DIAGNOSIS — R0602 Shortness of breath: Secondary | ICD-10-CM

## 2021-04-23 DIAGNOSIS — Z882 Allergy status to sulfonamides status: Secondary | ICD-10-CM

## 2021-04-23 DIAGNOSIS — I495 Sick sinus syndrome: Secondary | ICD-10-CM | POA: Diagnosis present

## 2021-04-23 DIAGNOSIS — E785 Hyperlipidemia, unspecified: Secondary | ICD-10-CM

## 2021-04-23 DIAGNOSIS — Z806 Family history of leukemia: Secondary | ICD-10-CM

## 2021-04-23 DIAGNOSIS — D649 Anemia, unspecified: Secondary | ICD-10-CM | POA: Diagnosis not present

## 2021-04-23 DIAGNOSIS — Z801 Family history of malignant neoplasm of trachea, bronchus and lung: Secondary | ICD-10-CM

## 2021-04-23 DIAGNOSIS — K5730 Diverticulosis of large intestine without perforation or abscess without bleeding: Secondary | ICD-10-CM | POA: Diagnosis present

## 2021-04-23 DIAGNOSIS — C180 Malignant neoplasm of cecum: Principal | ICD-10-CM | POA: Diagnosis present

## 2021-04-23 DIAGNOSIS — J45909 Unspecified asthma, uncomplicated: Secondary | ICD-10-CM | POA: Diagnosis present

## 2021-04-23 LAB — CBC WITH DIFFERENTIAL/PLATELET
Abs Immature Granulocytes: 0.07 10*3/uL (ref 0.00–0.07)
Basophils Absolute: 0.1 10*3/uL (ref 0.0–0.1)
Basophils Relative: 1 %
Eosinophils Absolute: 0.2 10*3/uL (ref 0.0–0.5)
Eosinophils Relative: 3 %
HCT: 18.3 % — ABNORMAL LOW (ref 36.0–46.0)
Hemoglobin: 5.4 g/dL — ABNORMAL LOW (ref 12.0–15.0)
Immature Granulocytes: 1 %
Lymphocytes Relative: 15 %
Lymphs Abs: 1.5 10*3/uL (ref 0.7–4.0)
MCH: 19.6 pg — ABNORMAL LOW (ref 26.0–34.0)
MCHC: 29.5 g/dL — ABNORMAL LOW (ref 30.0–36.0)
MCV: 66.5 fL — ABNORMAL LOW (ref 80.0–100.0)
Monocytes Absolute: 0.9 10*3/uL (ref 0.1–1.0)
Monocytes Relative: 10 %
Neutro Abs: 6.9 10*3/uL (ref 1.7–7.7)
Neutrophils Relative %: 70 %
Platelets: 344 10*3/uL (ref 150–400)
RBC: 2.75 MIL/uL — ABNORMAL LOW (ref 3.87–5.11)
RDW: 18.5 % — ABNORMAL HIGH (ref 11.5–15.5)
WBC: 9.7 10*3/uL (ref 4.0–10.5)
nRBC: 0 % (ref 0.0–0.2)

## 2021-04-23 LAB — COMPREHENSIVE METABOLIC PANEL
ALT: 17 U/L (ref 0–44)
AST: 21 U/L (ref 15–41)
Albumin: 3.1 g/dL — ABNORMAL LOW (ref 3.5–5.0)
Alkaline Phosphatase: 109 U/L (ref 38–126)
Anion gap: 5 (ref 5–15)
BUN: 18 mg/dL (ref 8–23)
CO2: 25 mmol/L (ref 22–32)
Calcium: 8.2 mg/dL — ABNORMAL LOW (ref 8.9–10.3)
Chloride: 93 mmol/L — ABNORMAL LOW (ref 98–111)
Creatinine, Ser: 0.58 mg/dL (ref 0.44–1.00)
GFR, Estimated: >60 mL/min (ref >60)
Glucose, Bld: 95 mg/dL (ref 70–99)
Potassium: 3.6 mmol/L (ref 3.5–5.1)
Sodium: 123 mmol/L — ABNORMAL LOW (ref 135–145)
Total Bilirubin: 0.7 mg/dL (ref 0.3–1.2)
Total Protein: 6.5 g/dL (ref 6.5–8.1)

## 2021-04-23 LAB — FOLATE: Folate: 43 ng/mL (ref >5.9)

## 2021-04-23 LAB — BRAIN NATRIURETIC PEPTIDE: B Natriuretic Peptide: 190.6 pg/mL — ABNORMAL HIGH (ref 0.0–100.0)

## 2021-04-23 LAB — CBC
HCT: 21.2 % — ABNORMAL LOW (ref 36.0–46.0)
HCT: 28.3 % — ABNORMAL LOW (ref 36.0–46.0)
HCT: 31.1 % — ABNORMAL LOW (ref 36.0–46.0)
Hemoglobin: 6.5 g/dL — ABNORMAL LOW (ref 12.0–15.0)
Hemoglobin: 8.7 g/dL — ABNORMAL LOW (ref 12.0–15.0)
Hemoglobin: 10.1 g/dL — ABNORMAL LOW (ref 12.0–15.0)
MCH: 20.4 pg — ABNORMAL LOW (ref 26.0–34.0)
MCH: 21.4 pg — ABNORMAL LOW (ref 26.0–34.0)
MCH: 23 pg — ABNORMAL LOW (ref 26.0–34.0)
MCHC: 30.7 g/dL (ref 30.0–36.0)
MCHC: 30.7 g/dL (ref 30.0–36.0)
MCHC: 32.5 g/dL (ref 30.0–36.0)
MCV: 66.7 fL — ABNORMAL LOW (ref 80.0–100.0)
MCV: 69.7 fL — ABNORMAL LOW (ref 80.0–100.0)
MCV: 70.8 fL — ABNORMAL LOW (ref 80.0–100.0)
Platelets: 310 10*3/uL (ref 150–400)
Platelets: 322 10*3/uL (ref 150–400)
Platelets: 275 10*3/uL (ref 150–400)
RBC: 3.18 MIL/uL — ABNORMAL LOW (ref 3.87–5.11)
RBC: 4.06 MIL/uL (ref 3.87–5.11)
RBC: 4.39 MIL/uL (ref 3.87–5.11)
RDW: 18.6 % — ABNORMAL HIGH (ref 11.5–15.5)
RDW: 22.1 % — ABNORMAL HIGH (ref 11.5–15.5)
RDW: 21.8 % — ABNORMAL HIGH (ref 11.5–15.5)
WBC: 8.5 10*3/uL (ref 4.0–10.5)
WBC: 10.7 10*3/uL — ABNORMAL HIGH (ref 4.0–10.5)
WBC: 11.2 10*3/uL — ABNORMAL HIGH (ref 4.0–10.5)
nRBC: 0 % (ref 0.0–0.2)
nRBC: 0 % (ref 0.0–0.2)
nRBC: 0 % (ref 0.0–0.2)

## 2021-04-23 LAB — BASIC METABOLIC PANEL
Anion gap: 7 (ref 5–15)
Anion gap: 8 (ref 5–15)
BUN: 15 mg/dL (ref 8–23)
BUN: 14 mg/dL (ref 8–23)
CO2: 25 mmol/L (ref 22–32)
CO2: 27 mmol/L (ref 22–32)
Calcium: 8 mg/dL — ABNORMAL LOW (ref 8.9–10.3)
Calcium: 8.2 mg/dL — ABNORMAL LOW (ref 8.9–10.3)
Chloride: 94 mmol/L — ABNORMAL LOW (ref 98–111)
Chloride: 95 mmol/L — ABNORMAL LOW (ref 98–111)
Creatinine, Ser: 0.52 mg/dL (ref 0.44–1.00)
Creatinine, Ser: 0.59 mg/dL (ref 0.44–1.00)
GFR, Estimated: >60 mL/min (ref >60)
GFR, Estimated: >60 mL/min (ref >60)
Glucose, Bld: 93 mg/dL (ref 70–99)
Glucose, Bld: 85 mg/dL (ref 70–99)
Potassium: 3.6 mmol/L (ref 3.5–5.1)
Potassium: 3.2 mmol/L — ABNORMAL LOW (ref 3.5–5.1)
Sodium: 126 mmol/L — ABNORMAL LOW (ref 135–145)
Sodium: 130 mmol/L — ABNORMAL LOW (ref 135–145)

## 2021-04-23 LAB — IRON AND TIBC
Iron: 10 ug/dL — ABNORMAL LOW (ref 28–170)
Saturation Ratios: 2 % — ABNORMAL LOW (ref 10.4–31.8)
TIBC: 456 ug/dL — ABNORMAL HIGH (ref 250–450)
UIBC: 446 ug/dL

## 2021-04-23 LAB — PREPARE RBC (CROSSMATCH)

## 2021-04-23 LAB — FERRITIN: Ferritin: 7 ng/mL — ABNORMAL LOW (ref 11–307)

## 2021-04-23 LAB — RETICULOCYTES
Immature Retic Fract: 19.4 % — ABNORMAL HIGH (ref 2.3–15.9)
RBC.: 2.78 MIL/uL — ABNORMAL LOW (ref 3.87–5.11)
Retic Count, Absolute: 83.7 10*3/uL (ref 19.0–186.0)
Retic Ct Pct: 3 % (ref 0.4–3.1)

## 2021-04-23 LAB — SODIUM, URINE, RANDOM: Sodium, Ur: 37 mmol/L

## 2021-04-23 LAB — OSMOLALITY: Osmolality: 265 mOsm/kg — ABNORMAL LOW (ref 275–295)

## 2021-04-23 LAB — PROTIME-INR
INR: 1.5 — ABNORMAL HIGH (ref 0.8–1.2)
Prothrombin Time: 17.9 seconds — ABNORMAL HIGH (ref 11.4–15.2)

## 2021-04-23 LAB — RESP PANEL BY RT-PCR (FLU A&B, COVID) ARPGX2
Influenza A by PCR: NEGATIVE
Influenza B by PCR: NEGATIVE
SARS Coronavirus 2 by RT PCR: NEGATIVE

## 2021-04-23 LAB — TROPONIN I (HIGH SENSITIVITY)
Troponin I (High Sensitivity): 6 ng/L (ref <18)
Troponin I (High Sensitivity): 7 ng/L (ref <18)

## 2021-04-23 LAB — OSMOLALITY, URINE: Osmolality, Ur: 305 mOsm/kg (ref 300–900)

## 2021-04-23 LAB — APTT: aPTT: 42 seconds — ABNORMAL HIGH (ref 24–36)

## 2021-04-23 LAB — VITAMIN B12: Vitamin B-12: 694 pg/mL (ref 180–914)

## 2021-04-23 MED ORDER — ATORVASTATIN CALCIUM 20 MG PO TABS
40.0000 mg | ORAL_TABLET | Freq: Every evening | ORAL | Status: DC
  Administered 2021-04-23 – 2021-04-26 (×4): 40 mg via ORAL
  Filled 2021-04-23 (×4): qty 2

## 2021-04-23 MED ORDER — HYDRALAZINE HCL 20 MG/ML IJ SOLN: 5.0000 mg | INTRAMUSCULAR | Status: DC | PRN

## 2021-04-23 MED ORDER — SODIUM CHLORIDE 0.9 % IV SOLN: 10.0000 mL/h | Freq: Once | INTRAVENOUS | Status: DC

## 2021-04-23 MED ORDER — PANTOPRAZOLE 80MG IVPB - SIMPLE MED
80.0000 mg | Freq: Once | INTRAVENOUS | Status: AC
  Administered 2021-04-23: 80 mg via INTRAVENOUS
  Filled 2021-04-23: qty 80

## 2021-04-23 MED ORDER — OLOPATADINE HCL 0.1 % OP SOLN
1.0000 [drp] | Freq: Every day | OPHTHALMIC | Status: DC
  Administered 2021-04-23 – 2021-04-26 (×4): 1 [drp] via OPHTHALMIC
  Filled 2021-04-23: qty 5

## 2021-04-23 MED ORDER — FUROSEMIDE 10 MG/ML IJ SOLN
20.0000 mg | Freq: Once | INTRAMUSCULAR | Status: AC
  Administered 2021-04-23: 10:00:00 20 mg via INTRAVENOUS
  Filled 2021-04-23: qty 4

## 2021-04-23 MED ORDER — IPRATROPIUM BROMIDE 0.02 % IN SOLN: 0.5000 mg | RESPIRATORY_TRACT | Status: DC | PRN

## 2021-04-23 MED ORDER — POLYETHYLENE GLYCOL 3350 17 G PO PACK
17.0000 g | PACK | Freq: Every day | ORAL | Status: DC | PRN
  Filled 2021-04-23: qty 1

## 2021-04-23 MED ORDER — DOCUSATE SODIUM 100 MG PO CAPS
100.0000 mg | ORAL_CAPSULE | Freq: Two times a day (BID) | ORAL | Status: DC
  Administered 2021-04-23 – 2021-04-27 (×8): 100 mg via ORAL
  Filled 2021-04-23 (×9): qty 1

## 2021-04-23 MED ORDER — PANTOPRAZOLE SODIUM 40 MG IV SOLR: 40.0000 mg | Freq: Two times a day (BID) | INTRAVENOUS | Status: DC

## 2021-04-23 MED ORDER — ACETAMINOPHEN 325 MG PO TABS
650.0000 mg | ORAL_TABLET | Freq: Four times a day (QID) | ORAL | Status: DC | PRN
  Administered 2021-04-25 – 2021-04-26 (×3): 650 mg via ORAL
  Filled 2021-04-23 (×3): qty 2

## 2021-04-23 MED ORDER — PANTOPRAZOLE INFUSION (NEW) - SIMPLE MED
8.0000 mg/h | INTRAVENOUS | Status: AC
  Administered 2021-04-23 – 2021-04-24 (×5): 8 mg/h via INTRAVENOUS
  Filled 2021-04-23 (×3): qty 100
  Filled 2021-04-23: qty 80
  Filled 2021-04-23: qty 100

## 2021-04-23 MED ORDER — MONTELUKAST SODIUM 10 MG PO TABS
10.0000 mg | ORAL_TABLET | Freq: Every day | ORAL | Status: DC
  Administered 2021-04-23 – 2021-04-26 (×4): 10 mg via ORAL
  Filled 2021-04-23 (×4): qty 1

## 2021-04-23 MED ORDER — LEVOTHYROXINE SODIUM 112 MCG PO TABS
112.0000 ug | ORAL_TABLET | Freq: Every day | ORAL | Status: DC
  Administered 2021-04-23 – 2021-04-27 (×4): 112 ug via ORAL
  Filled 2021-04-23 (×7): qty 1

## 2021-04-23 MED ORDER — MOMETASONE FURO-FORMOTEROL FUM 200-5 MCG/ACT IN AERO
2.0000 | INHALATION_SPRAY | Freq: Two times a day (BID) | RESPIRATORY_TRACT | Status: DC
  Administered 2021-04-23 – 2021-04-27 (×9): 2 via RESPIRATORY_TRACT
  Filled 2021-04-23: qty 8.8

## 2021-04-23 MED ORDER — DIAZEPAM 2 MG PO TABS
1.0000 mg | ORAL_TABLET | Freq: Two times a day (BID) | ORAL | Status: DC | PRN
  Administered 2021-04-24 (×2): 1 mg via ORAL
  Filled 2021-04-23 (×2): qty 1

## 2021-04-23 MED ORDER — OMEGA-3-ACID ETHYL ESTERS 1 G PO CAPS
1000.0000 mg | ORAL_CAPSULE | Freq: Every day | ORAL | Status: DC
  Administered 2021-04-23 – 2021-04-27 (×4): 1000 mg via ORAL
  Filled 2021-04-23 (×5): qty 1

## 2021-04-23 MED ORDER — SODIUM CHLORIDE 0.9% IV SOLUTION
Freq: Once | INTRAVENOUS | Status: DC
  Filled 2021-04-23: qty 250

## 2021-04-23 MED ORDER — SODIUM CHLORIDE 1 G PO TABS
2.0000 g | ORAL_TABLET | Freq: Two times a day (BID) | ORAL | Status: DC
  Administered 2021-04-24 – 2021-04-27 (×7): 2 g via ORAL
  Filled 2021-04-23 (×12): qty 2

## 2021-04-23 MED ORDER — PROTHROMBIN COMPLEX CONC HUMAN 500 UNITS IV KIT
3000.0000 [IU] | PACK | Status: AC
  Administered 2021-04-23: 05:00:00 3000 [IU] via INTRAVENOUS
  Filled 2021-04-23: qty 3000

## 2021-04-23 MED ORDER — ONDANSETRON HCL 4 MG/2ML IJ SOLN: 4.0000 mg | Freq: Three times a day (TID) | INTRAMUSCULAR | Status: DC | PRN

## 2021-04-23 MED ORDER — FERROUS SULFATE 325 (65 FE) MG PO TABS
325.0000 mg | ORAL_TABLET | Freq: Two times a day (BID) | ORAL | Status: DC
  Administered 2021-04-23 – 2021-04-26 (×8): 325 mg via ORAL
  Filled 2021-04-23 (×9): qty 1

## 2021-04-23 MED ORDER — SODIUM CHLORIDE 0.9 % IV SOLN
510.0000 mg | Freq: Once | INTRAVENOUS | Status: AC
  Administered 2021-04-23: 11:00:00 510 mg via INTRAVENOUS
  Filled 2021-04-23: qty 17

## 2021-04-23 MED ORDER — METHOCARBAMOL 500 MG PO TABS
500.0000 mg | ORAL_TABLET | Freq: Three times a day (TID) | ORAL | Status: DC | PRN
  Administered 2021-04-23 – 2021-04-26 (×5): 500 mg via ORAL
  Filled 2021-04-23 (×6): qty 1

## 2021-04-23 MED ORDER — VITAMIN D 25 MCG (1000 UNIT) PO TABS
2000.0000 [IU] | ORAL_TABLET | Freq: Every day | ORAL | Status: DC
Start: 2021-04-23 — End: 2021-04-27
  Administered 2021-04-23 – 2021-04-27 (×4): 2000 [IU] via ORAL
  Filled 2021-04-23 (×5): qty 2

## 2021-04-23 NOTE — Significant Event (Signed)
Called by Dr Blaine Hamper regarding a pacemaker malfunction. According to device interrogation, the LV lead threshold has increased progressively along 1 vector of the lead; this was changed to a different vector with improvement per the device rep, Logan, [employer]. Also appears optivol readings have been increased recently suggesting possible increase in fluid retention. Please call with additional questions.   Andrez Grime, MD

## 2021-04-23 NOTE — ED Notes (Addendum)
Nephew at bedside.

## 2021-04-23 NOTE — ED Notes (Signed)
Pt assisted back into bed. Pt call bell at bedside. Vitals restarted.

## 2021-04-23 NOTE — ED Notes (Signed)
Pt up to [facility]

## 2021-04-23 NOTE — ED Triage Notes (Addendum)
Pt to ED via [facility] from home.  Pt has increased shortness of breath over past few days, states she has taken her prescribed inhaler without relief.  NAD noted. Pt able to speak in full sentences in triage w/o difficulty.  Pt states she was called by hospital and was told her labwork from 12/26 was not normal.  Pt left before seen on 12/26. Pt labs from yesterday, hgb=5.3.

## 2021-04-23 NOTE — ED Notes (Signed)
Patient incontinent to urine. Patient was cleaned and placed ina new gown. Bed linens were changed.

## 2021-04-23 NOTE — ED Notes (Signed)
Spoke with Logan with [employer] and he stated looks like her LV lead is not working. Plans to come and take a look.   Call him with any questions at [phone]

## 2021-04-23 NOTE — ED Notes (Signed)
Pt on call with nephew

## 2021-04-23 NOTE — H&P (Addendum)
History and Physical    [name] CXK:[id number] DOB: 1925/03/05 DOA: 04/23/2021  Referring MD/NP/PA:   PCP: Ezequiel Kayser, MD (Inactive)   Patient coming from:  The patient is coming from home.  At baseline, pt is independent for most of ADL.        Chief Complaint: dark stool and SOB  HPI: [name] is a 87 y.o. female with medical history significant of atrial fibrillation on Eliquis, hypertension, hyperlipidemia, asthma, TIA, GERD, hypothyroidism, sinoatrial node dysfunction (s/p of AICD placement), s/p biliary T-tube placement, who presents with dark stool and shortness breath.  Patient states that she has been having intermittent dark stool for almost a month.  No nausea vomiting, diarrhea or abdominal pain.  She developed shortness of breath, particularly on exertion.  She has dry cough, no chest pain.  No fever or chills.  She states she is not using oxygen normally.  ED Course: pt was found to have hemoglobin 5.4 (8.0 on 09/19/2020), WBC 9.7, INR 1.5, troponin 6 --> 7, BNP 190, negative COVID PCR, sodium 123, GFR > 60, blood pressure 151/62, heart rate 75, RR 21 --> 17, oxygen saturation 93% on room air, patient was put 2 L oxygen due to shortness of breath with saturation 100%, temperature normal.  Patient is admitted to MedSurg bed as inpatient.  Dr. Alice Reichert of GI is consulted.  CXR: 1. Cardiomegaly with moderate severity congestive heart failure. 2. Mild to moderate severity right basilar atelectasis and/or infiltrate.  Review of Systems:   General: no fevers, chills, no body weight gain, has fatigue HEENT: no blurry vision, hearing changes or sore throat Respiratory: has dyspnea, coughing, no wheezing CV: no chest pain, no palpitations GI: no nausea, vomiting, abdominal pain, diarrhea, constipation. Has dark stool GU: no dysuria, burning on urination, increased urinary frequency, hematuria  Ext: has leg edema Neuro: no unilateral weakness, numbness,  or tingling, no vision change or hearing loss Skin: no rash, no skin tear. MSK: No muscle spasm, no deformity, no limitation of range of movement in spin Heme: No easy bruising.  Travel history: No recent long distant travel.  Allergy:  Allergies  Allergen Reactions   Fluticasone-Salmeterol Anaphylaxis   Albuterol     Other reaction(s): Other (See Comments) It burns and makes her cough "too strong for me or something"   Ciprofloxacin Other (See Comments) and Hives    Other reaction(s): Unknown Does not remember   Codeine Hives   Colchicine Diarrhea   Other Other (See Comments)   Promethazine Other (See Comments)    Unable to speak. Loss muscle control (PHENGERAN)   Valacyclovir     Other reaction(s): Muscle Pain Could hardly move   Guaifenesin Palpitations   Sulfa Antibiotics Rash and Hives    Past Medical History:  Diagnosis Date   Arthritis    Asthma    Atrial fibrillation (HCC)    Cataract    Eczema    GERD (gastroesophageal reflux disease)    Gout of wrist    Heart murmur    Pacemaker    Pseudogout of ankle or foot    Thyroid disease    TIA (transient ischemic attack)     Past Surgical History:  Procedure Laterality Date   ABLATION     EYE SURGERY Bilateral    cataract   foot sugery     HIP ARTHROPLASTY     right   IMPLANTABLE CARDIOVERTER DEFIBRILLATOR (ICD) GENERATOR CHANGE Left 05/29/2019   Procedure: PACEMAKER CHANGE  OUT;  Surgeon: Isaias Cowman, MD;  Location: [facility] ORS;  Service: Cardiovascular;  Laterality: Left;   INTRAMEDULLARY (IM) NAIL INTERTROCHANTERIC Left 09/16/2020   Procedure: INTRAMEDULLARY (IM) NAIL INTERTROCHANTRIC;  Surgeon: Renee Harder, MD;  Location: [facility] ORS;  Service: Orthopedics;  Laterality: Left;   PACEMAKER INSERTION      Social History:  reports that she has never smoked. She has never used smokeless tobacco. She reports that she does not currently use alcohol. She reports that she does not use drugs.  Family  History:  Family History  Problem Relation Age of Onset   Leukemia Father    Lung cancer Sister    Breast cancer Neg Hx      Prior to Admission medications   Medication Sig Start Date End Date Taking? Authorizing Provider  apixaban (ELIQUIS) 5 MG TABS tablet Take 5 mg by mouth 2 (two) times daily.  06/26/13  Yes [provider]  atorvastatin (LIPITOR) 40 MG tablet Take 40 mg by mouth every evening.    Yes [provider]  budesonide-formoterol (SYMBICORT) 160-4.5 MCG/ACT inhaler Inhale 2 puffs into the lungs 2 (two) times daily.  09/09/13  Yes [provider]  Cholecalciferol (VITAMIN D3) 50 MCG (2000 UT) TABS Take 2,000 Units by mouth daily.    Yes [provider]  diclofenac Sodium (VOLTAREN) 1 % GEL Apply 2 g topically daily as needed (pain).  08/22/13  Yes [provider]  docusate sodium (COLACE) 100 MG capsule Take 1 capsule (100 mg total) by mouth 2 (two) times daily. 09/19/20  Yes Bonnielee Haff, MD  famotidine (PEPCID) 20 MG tablet Take 20 mg by mouth 2 (two) times daily.    Yes [provider]  levothyroxine (SYNTHROID, LEVOTHROID) 112 MCG tablet Take 112 mcg by mouth daily before breakfast.   Yes [provider]  montelukast (SINGULAIR) 10 MG tablet Take 10 mg by mouth at bedtime.  12/27/13  Yes [provider]  Olopatadine HCl 0.7 % SOLN Apply 1 drop to eye at bedtime.    Yes [provider]  Omega-3 1000 MG CAPS Take 1,000 mg by mouth daily.    Yes [provider]  Skin Protectants, Misc. (EUCERIN) cream Apply topically as needed for dry skin. Apply to tops of feet 07/07/20  Yes Cuthriell, Charline Bills, PA-C  acetaminophen (TYLENOL) 650 MG CR tablet Take 650 mg by mouth every 8 (eight) hours as needed for pain.    [provider]  cephALEXin (KEFLEX) 500 MG capsule Take 1 capsule (500 mg total) by mouth 4 (four) times daily. Patient not taking: Reported on 04/23/2021 03/26/21   Marney Setting, NP  clotrimazole (LOTRIMIN) 1 % cream Apply 1 application topically 2 (two) times daily. Apply to toes of R foot Patient not taking: Reported on 04/23/2021 07/07/20   Cuthriell, Charline Bills, PA-C  polyethylene glycol (MIRALAX / GLYCOLAX) 17 g packet Take 17 g by mouth daily. Patient not taking: Reported on 04/23/2021 09/19/20   Bonnielee Haff, MD  predniSONE (STERAPRED UNI-PAK 21 TAB) 10 MG (21) TBPK tablet Take 6 tablets on day 1, 5 tablets day 2, 4 tablets day 3, 3 tablets day 4, 2 tablets day 5, 1 tablet day 6 Patient not taking: Reported on 04/23/2021 04/05/21   Margarette Canada, NP  traMADol (ULTRAM) 50 MG tablet Take 1 tablet (50 mg total) by mouth every 6 (six) hours as needed for moderate pain. Patient not taking: Reported on 04/23/2021 01/06/21   Margarette Canada, NP  triamcinolone (KENALOG) 0.1 % Apply 1 application topically 2 (two) times daily. Patient not taking: Reported on 04/23/2021 07/14/20   Margarette Canada, NP    Physical Exam: Vitals:   04/23/21 0515 04/23/21 0530 04/23/21 0630 04/23/21 0705  BP: 139/62 (!) 142/73 (!) 157/69 (!) 151/62  Pulse: 63 75 66 60  Resp: 18 20 20 17   Temp: (!) 97.4 F (36.3 C)   (!) 97.4 F (36.3 C)  TempSrc: Oral   Oral  SpO2:  100% 97% 100%  Weight:      Height:       General: Not in acute distress.  Pale looking HEENT:       Eyes: PERRL, EOMI, no scleral icterus.       ENT: No discharge from the ears and nose, no pharynx injection, no tonsillar enlargement.        Neck: No JVD, no bruit, no mass felt. Heme: No neck lymph node enlargement. Cardiac: S1/S2, RRR, No murmurs, No gallops or rubs. Respiratory: No rales, wheezing, rhonchi or rubs. GI: Soft, nondistended, nontender, no rebound pain, no organomegaly, BS present. GU: No hematuria Ext: 1+ pitting leg edema bilaterally. 1+DP/PT pulse bilaterally. Musculoskeletal: No joint deformities, No joint redness or warmth, no limitation of ROM in spin. Skin: No rashes.  Neuro: Alert,  oriented X3, cranial nerves II-XII grossly intact, moves all extremities normally.  Psych: Patient is not psychotic, no suicidal or hemocidal ideation.  Labs on Admission: I have personally reviewed following labs and imaging studies  CBC: Recent Labs  Lab 04/19/21 1533 04/23/21 0248  WBC 13.4* 9.7  NEUTROABS  --  6.9  HGB 5.3* 5.4*  HCT 17.9* 18.3*  MCV 66.5* 66.5*  PLT 319 322   Basic Metabolic Panel: Recent Labs  Lab 04/19/21 1533 04/23/21 0248  NA 126* 123*  K 3.7 3.6  CL 93* 93*  CO2 24 25  GLUCOSE 108* 95  BUN 15 18  CREATININE 0.49 0.58  CALCIUM 8.3* 8.2*   GFR: Estimated Creatinine Clearance: 37.1 mL/min (by C-G formula based on SCr of 0.58 mg/dL). Liver Function Tests: Recent Labs  Lab 04/23/21 0248  AST 21  ALT 17  ALKPHOS 109  BILITOT 0.7  PROT 6.5  ALBUMIN 3.1*   No results for input(s): LIPASE, AMYLASE in the last 168 hours. No results for input(s): AMMONIA in the last 168 hours. Coagulation Profile: Recent Labs  Lab 04/23/21 0248  INR 1.5*   Cardiac Enzymes: No results for input(s): CKTOTAL, CKMB, CKMBINDEX, TROPONINI in the last 168 hours. BNP (last 3 results) No results for input(s): PROBNP in the last 8760 hours. HbA1C: No results for input(s): HGBA1C in the last 72 hours. CBG: No results for input(s): GLUCAP in the last 168 hours. Lipid Profile: No results for input(s): CHOL, HDL, LDLCALC, TRIG, CHOLHDL, LDLDIRECT in the last 72 hours. Thyroid Function Tests: No results for input(s): TSH, T4TOTAL, FREET4, T3FREE, THYROIDAB in the last 72 hours. Anemia Panel: Recent Labs    04/23/21 0248  FOLATE 43.0  FERRITIN 7*  TIBC 456*  IRON 10*  RETICCTPCT 3.0   Urine analysis:    Component Value Date/Time   COLORURINE YELLOW 07/25/2019 1345   APPEARANCEUR CLEAR 07/25/2019 1345   APPEARANCEUR Clear 08/12/2013 1822   LABSPEC 1.020 07/25/2019 1345   LABSPEC 1.023 08/12/2013 1822   PHURINE 7.0 07/25/2019 1345   GLUCOSEU NEGATIVE  07/25/2019 1345   GLUCOSEU Negative 08/12/2013 1822   HGBUR SMALL (A) 07/25/2019 1345   BILIRUBINUR NEGATIVE 07/25/2019  [place] Negative 08/12/2013 1822   [place] 07/25/2019 [place] 07/25/2019 1345   NITRITE NEGATIVE 07/25/2019 1345   LEUKOCYTESUR TRACE (A) 07/25/2019 1345   LEUKOCYTESUR Negative 08/12/2013 1822   Sepsis Labs: @LABRCNTIP (procalcitonin:4,lacticidven:4) ) Recent Results (from the past 240 hour(s))  Resp Panel by RT-PCR (Flu A&B, Covid) Nasopharyngeal Swab     Status: None   Collection Time: 04/23/21  2:48 AM   Specimen: Nasopharyngeal Swab; Nasopharyngeal(NP) swabs in vial transport medium  Result Value Ref Range Status   SARS Coronavirus 2 by RT PCR NEGATIVE NEGATIVE Final    Comment: (NOTE) SARS-CoV-2 target nucleic acids are NOT DETECTED.  The SARS-CoV-2 RNA is generally detectable in upper respiratory specimens during the acute phase of infection. The lowest concentration of SARS-CoV-2 viral copies this assay can detect is 138 copies/mL. A negative result does not preclude SARS-Cov-2 infection and should not be used as the sole basis for treatment or other patient management decisions. A negative result may occur with  improper specimen collection/handling, submission of specimen other than nasopharyngeal swab, presence of viral mutation(s) within the areas targeted by this assay, and inadequate number of viral copies(<138 copies/mL). A negative result must be combined with clinical observations, patient history, and epidemiological information. The expected result is Negative.  Fact Sheet for Patients:  EntrepreneurPulse.com.au  Fact Sheet for Healthcare Providers:  IncredibleEmployment.be  This test is no t yet approved or cleared by the [place] FDA and  has been authorized for detection and/or diagnosis of SARS-CoV-2 by FDA under an Emergency Use Authorization (EUA). This  EUA will remain  in effect (meaning this test can be used) for the duration of the COVID-19 declaration under Section 564(b)(1) of the Act, 21 U.S.C.section 360bbb-3(b)(1), unless the authorization is terminated  or revoked sooner.       Influenza A by PCR NEGATIVE NEGATIVE Final   Influenza B by PCR NEGATIVE NEGATIVE Final    Comment: (NOTE) The Xpert Xpress SARS-CoV-2/FLU/RSV plus assay is intended as an aid in the diagnosis of influenza from Nasopharyngeal swab specimens and should not be used as a sole basis for treatment. Nasal washings and aspirates are unacceptable for Xpert Xpress SARS-CoV-2/FLU/RSV testing.  Fact Sheet for Patients: EntrepreneurPulse.com.au  Fact Sheet for Healthcare Providers: IncredibleEmployment.be  This test is not yet approved or cleared by the [place] FDA and has been authorized for detection and/or diagnosis of SARS-CoV-2 by FDA under an Emergency Use Authorization (EUA). This EUA will remain in effect (meaning this test can be used) for the duration of the COVID-19 declaration under Section 564(b)(1) of the Act, 21 U.S.C. section 360bbb-3(b)(1), unless the authorization is terminated or revoked.  Performed at [facility], [address] [place], [place] [address]      Radiological Exams on Admission: DG Chest Portable 1 View  Result Date: 04/23/2021 CLINICAL DATA:  Worsening shortness of breath. EXAM: PORTABLE CHEST 1 VIEW COMPARISON:  Sep 15, 2020 FINDINGS: A dual lead AICD is present with stable lead wire positioning. Mild diffusely increased interstitial lung markings are seen with moderate severity prominence of the pulmonary vasculature. Mild to moderate severity atelectasis and/or infiltrate is seen within the right lung base. The cardiac silhouette is mildly enlarged. Multilevel degenerative changes noted throughout the thoracic spine. IMPRESSION: 1. Cardiomegaly with moderate  severity congestive heart failure. 2. Mild to moderate severity right basilar atelectasis and/or infiltrate. Electronically Signed   By: Virgina Norfolk M.D.   On: 04/23/2021 03:55  EKG: I have personally reviewed.  Paced rhythm, QTC 526  Assessment/Plan Principal Problem:   Acute GI bleeding Active Problems:   HLD (hyperlipidemia)   Adult hypothyroidism   HTN (hypertension)   Asthma   TIA (transient ischemic attack)   Atrial fibrillation, chronic (HCC)   Acute blood loss anemia   Iron deficiency anemia   Acute GI bleeding and acute blood loss anemia: Hemoglobin dropped from 8.0-5.4.  Dr. Alice Reichert for GI is consulted.  Patient is on Eliquis.  1 dose of Kcentra was given in ED.  - will admitted to med-surg bed as inpatient - Hold Eliquis - transfuse 3 units of blood now - IVF: not give IVF due to elevated BNP and possible fluid overload - Start IV pantoprazole gtt - Zofran IV for nausea - Avoid NSAIDs and SQ heparin - Maintain IV access (2 large bore IVs if possible). - Monitor closely and follow q6h cbc, transfuse as necessary, if Hgb<7.0 - LaB: INR, PTT and type screen  Iron deficiency anemia: Anemia panel findings are consistent with iron deficiency.  Iron 10, ferritin 7 -Give 1 dose of IV Feraheme at 510 mg -Start ferrous sulfate orally  HLD (hyperlipidemia) -Lipitor  Adult hypothyroidism -Synthroid  HTN (hypertension): Not taking medications currently -IV hydralazine as needed  Asthma and shortness of breath: No wheezing on auscultation, but the patient has a shortness of breath which is at least partially due to her anemia.  Patient also has 1+ leg edema, elevated BNP 190, indicating possible fluid overload.  Patient does not have a history of CHF.  Recent A1c on 11/19/2019 showed EF > 55%. -will give 20 mg of Lasix IV -Continue bronchodilators -Will get 2D echo  TIA (transient ischemic attack) -Lipitor  Atrial fibrillation, chronic ([place]): Heart rate 75.   Patient is not on nodal blocker -Hold Eliquis  Addendum:  Pt has AICD placement due to sinoatrial node dysfunction. Pacemaker interrogation was done, and is reporting one lead is leading more energy than the others. They suggested cardiology consult. -consulted Dr. Corky Sox of card. Per Dr. Corky Sox, the left LV lead has increased independence in while for the left chest, so they changed to different vector and now it is working.    DVT ppx: SCD Code Status: Full code per pt and her nephew Family Communication:  Yes, patient's nephew   by phone Disposition Plan:  Anticipate discharge back to previous environment Consults called: Dr. Alice Reichert Admission status and Level of care: Telemetry Medical:  as inpt       Status is: Inpatient  Remains inpatient appropriate because: Patient has multiple comorbidities, now presents with GI bleeding, symptomatic anemia, hemoglobin dropped from 8-5.4.  Patient also has hyponatremia with sodium 123.  Her presentation is highly complicated.  Patient is at high risk of deteriorating.  Need to be treated in hospital for at least 2 days.          Date of Service 04/23/2021    Ivor Costa [facility]   If 7PM-7AM, please contact night-coverage [email or web address] 04/23/2021, 7:33 AM

## 2021-04-23 NOTE — ED Notes (Signed)
Called bloodbank and they said the pt's blood is ready. Pt will need a H&H before they can approve a 3rd unit.

## 2021-04-23 NOTE — Consult Note (Signed)
GI Inpatient Consult Note  Reason for Consult: Symptomatic anemia    Attending Requesting Consult: Dr. Ivor Costa, MD  History of Present Illness: [name] is a 87 y.o. female seen for evaluation of symptomatic anemia at the request of hospitalist - Dr. Ivor Costa. Pt has a PMH of HTN, HLD, COPD, atrial fibrillation on Eliquis, GERD, Hx of TIA, s/p ICD placement, and thyroid disease. She presented to the [facility] ED early this AM via EMS for chief complaint of symptomatic anemia with exertional dyspnea and increased shortness of breath. She also reported a one month history of darker stools. She was seen in the ED 12/26 but left without being seen due to long wait times. She did have labs drawn at this time which showed hemoglobin 5.3. She was called about these abnormal blood values and was told to come to the ED. Upon presentation to the ED this morning, labs showed hemoglobin 5.4 (was 8.0 in May 2022), WBC 9.7K, INR 1.5, BNP 190, sodium 123, and negative COVID PCR. Per ED physician, rectal exam showed guaiac positive stool. Chest x-ray showed cardiomegaly with moderate severity congestive heart failure. She was started on Protonix, given Kcentra to reverse Eliquis, and given 3 units of pRBCs. GI consulted in context of symptomatic anemia and guaiac positive stools.   Patient seen and examined this afternoon resting in ED stretcher. She is confused why she is in the hospital. She asks the same question repeatedly. She reports she lives with her nephew. She is independent at baseline and is able to perform all of her ADLs. She denies any overt hematochezia or melena. She has had darker stools for a month but reports she cannot tell if they are tarry appearing. She denies any nausea, vomiting, dysphagia, heartburn, reflux, early satiety, epigastric abdominal pain, or unintentional weight loss. Appetite has been reduced but she has still been eating. Main complaint is the fatigue and generalized  weakness. She denies any falls or syncopal episodes. She reports she does not take NSAIDs at home.   Past Medical History:  Past Medical History:  Diagnosis Date   Arthritis    Asthma    Atrial fibrillation (HCC)    Cataract    Eczema    GERD (gastroesophageal reflux disease)    Gout of wrist    Heart murmur    Pacemaker    Pseudogout of ankle or foot    Thyroid disease    TIA (transient ischemic attack)     Problem List: Patient Active Problem List   Diagnosis Date Noted   Acute GI bleeding 04/23/2021   HTN (hypertension) 04/23/2021   Asthma 04/23/2021   TIA (transient ischemic attack) 04/23/2021   Atrial fibrillation, chronic ([place]) 04/23/2021   Acute blood loss anemia 04/23/2021   Iron deficiency anemia 04/23/2021   Hyponatremia    Symptomatic anemia    Closed left hip fracture ([place]) 09/15/2020   Acute appendicitis 07/25/2019   Leukocytosis 06/29/2016   Hypogammaglobulinemia ([place]) 06/29/2016   Temporary cerebral vascular dysfunction 02/28/2014   DD (diverticular disease) 02/28/2014   Allergic rhinitis 02/28/2014   Allergy status to sulfonamides 02/28/2014   History of anticoagulant therapy 02/28/2014   Arthritis 02/28/2014   Airway hyperreactivity 02/28/2014   A-fib ([place]) 02/28/2014   Bilateral cataracts 02/28/2014   Dermatitis, eczematoid 02/28/2014   Allergy to environmental factors 02/28/2014   Acid reflux 02/28/2014   Polypharmacy 02/28/2014   HLD (hyperlipidemia) 02/28/2014   Adult hypothyroidism 02/28/2014   BP (high  blood pressure) 02/28/2014   Arthritis, degenerative 02/28/2014   Degenerative arthritis of hip 10/09/2013   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   Neck rigid 05/30/2013   1st degree AV block 10/10/2012   History of biliary T-tube placement 06/14/2012   History of cardioversion 06/05/2012   Avascular necrosis of femoral head ([place]) 06/01/2012   Lumbar radiculopathy 06/01/2012   DDD (degenerative disc disease),  lumbar 06/01/2012   H/O cardiac catheterization 10/20/2009    Past Surgical History: Past Surgical History:  Procedure Laterality Date   ABLATION     EYE SURGERY Bilateral    cataract   foot sugery     HIP ARTHROPLASTY     right   IMPLANTABLE CARDIOVERTER DEFIBRILLATOR (ICD) GENERATOR CHANGE Left 05/29/2019   Procedure: PACEMAKER CHANGE OUT;  Surgeon: Isaias Cowman, MD;  Location: [facility] ORS;  Service: Cardiovascular;  Laterality: Left;   INTRAMEDULLARY (IM) NAIL INTERTROCHANTERIC Left 09/16/2020   Procedure: INTRAMEDULLARY (IM) NAIL INTERTROCHANTRIC;  Surgeon: Renee Harder, MD;  Location: [facility] ORS;  Service: Orthopedics;  Laterality: Left;   PACEMAKER INSERTION      Allergies: Allergies  Allergen Reactions   Fluticasone-Salmeterol Anaphylaxis   Albuterol     Other reaction(s): Other (See Comments) It burns and makes her cough "too strong for me or something"   Ciprofloxacin Other (See Comments) and Hives    Other reaction(s): Unknown Does not remember   Codeine Hives   Colchicine Diarrhea   Other Other (See Comments)   Promethazine Other (See Comments)    Unable to speak. Loss muscle control (PHENGERAN)   Valacyclovir     Other reaction(s): Muscle Pain Could hardly move   Guaifenesin Palpitations   Sulfa Antibiotics Rash and Hives    Home Medications: (Not in a hospital admission)  Home medication reconciliation was completed with the patient.   Scheduled Inpatient Medications:    atorvastatin  40 mg Oral QPM   cholecalciferol  2,000 Units Oral Daily   docusate sodium  100 mg Oral BID   ferrous sulfate  325 mg Oral BID WC   levothyroxine  112 mcg Oral QAC breakfast   mometasone-formoterol  2 puff Inhalation BID   montelukast  10 mg Oral QHS   olopatadine  1 drop Both Eyes QHS   omega-3 acid ethyl esters  1,000 mg Oral Daily   [START ON 04/26/2021] pantoprazole  40 mg Intravenous Q12H   sodium chloride  2 g Oral BID WC    Continuous Inpatient  Infusions:    sodium chloride Stopped (04/23/21 0405)   pantoprazole 8 mg/hr (04/23/21 0404)    PRN Inpatient Medications:  acetaminophen, hydrALAZINE, ipratropium, ondansetron (ZOFRAN) IV, polyethylene glycol  Family History: family history includes Leukemia in her father; Lung cancer in her sister.  The patient's family history is negative for inflammatory bowel disorders, GI malignancy, or solid organ transplantation.  Social History:   reports that she has never smoked. She has never used smokeless tobacco. She reports that she does not currently use alcohol. She reports that she does not use drugs. The patient denies ETOH, tobacco, or drug use.   Review of Systems: Constitutional: Weight is stable.  Eyes: No changes in vision. ENT: No oral lesions, sore throat.  GI: see HPI.  Heme/Lymph: No easy bruising.  CV: No chest pain.  GU: No hematuria.  Integumentary: No rashes.  Neuro: No headaches.  Psych: No depression/anxiety.  Endocrine: No heat/cold intolerance.  Allergic/Immunologic: No urticaria.  Resp: No cough, SOB.  Musculoskeletal:  No joint swelling.    Physical Examination: BP 136/90    Pulse (!) 57    Temp (!) 97.5 F (36.4 C) (Axillary)    Resp 17    Ht 5\' 4"  (1.626 m)    Wt 56.7 kg    SpO2 99%    BMI 21.46 kg/m  Gen: NAD, alert and oriented x 4, somewhat confused  HEENT: PEERLA, EOMI, Neck: supple, no JVD or thyromegaly Chest: CTA bilaterally, no wheezes, crackles, or other adventitious sounds CV: RRR, no m/g/c/r Abd: soft, NT, ND, +BS in all four quadrants; no HSM, guarding, ridigity, or rebound tenderness Ext: no edema, well perfused with 2+ pulses, Skin: no rash or lesions noted Lymph: no LAD  Data: Lab Results  Component Value Date   WBC 8.5 04/23/2021   HGB 6.5 (L) 04/23/2021   HCT 21.2 (L) 04/23/2021   MCV 66.7 (L) 04/23/2021   PLT 310 04/23/2021   Recent Labs  Lab 04/19/21 1533 04/23/21 0248 04/23/21 0838  HGB 5.3* 5.4* 6.5*   Lab  Results  Component Value Date   NA 126 (L) 04/23/2021   K 3.6 04/23/2021   CL 94 (L) 04/23/2021   CO2 25 04/23/2021   BUN 15 04/23/2021   CREATININE 0.52 04/23/2021   Lab Results  Component Value Date   ALT 17 04/23/2021   AST 21 04/23/2021   ALKPHOS 109 04/23/2021   BILITOT 0.7 04/23/2021   Recent Labs  Lab 04/23/21 0248 04/23/21 0838  APTT  --  42*  INR 1.5*  --    Assessment/Plan:  87 y/o Caucasian female with a PMH of HTN, HLD, COPD, atrial fibrillation on Eliquis, GERD, Hx of TIA, s/p ICD placement, and thyroid disease presented to the [facility] ED overnight for symptomatic anemia  Symptomatic anemia/Iron-deficiency anemia - DDx includes peptic ulcer disease, gastritis +/- H pylori, esophagitis, duodenitis, AVMs, Dieulafoy's lesion, occult GI neoplasm, colon polyp, etc  Heme positive stool  Chronic atrial fibrillation on Eliquis  Advanced age  Recommendations:  - Maintain 2 large bore Ivs if possible - Continue to monitor H&H closely. Transfuse for Hgb <7.0.  - Agree with Protonix gtt for acid suppression - No overt gastrointestinal blood loss. Monitor for signs of overt bleeding.  - Discussed potential work-up of symptomatic anemia and heme positive stool with bidirectional endoscopy with patient. She wants to discuss with her nephew and I will also plan to talk to him this afternoon to discuss potential for luminal evaluation.  - Hold Eliquis. Avoid all NSAIDs.  - EGD/colonoscopy when clinically feasible - Dr. Alice Reichert and myself will be following this weekend   Thank you for the consult. Please call with questions or concerns.  Reeves Forth [facility] [phone] (Cell)

## 2021-04-23 NOTE — ED Provider Notes (Signed)
[facility] Emergency Department Provider Note  ____________________________________________   Event Date/Time   First MD Initiated Contact with Patient 04/23/21 [phone]     (approximate)  I have reviewed the triage vital signs and the nursing notes.   HISTORY  Chief Complaint Shortness of Breath    HPI [name] is a 87 y.o. female with history of atrial fibrillation on Eliquis, hypertension, hyperlipidemia, COPD not on home oxygen, SA node dysfunction status post pacemaker/AICD who presents to the emergency department several days of shortness of breath worse with exertion.  Has also had chest tightness but none currently.  Reports 4 weeks she has noticed darker than normal stools but denies hematochezia.  No fevers or cough.  She states she has had previous colonoscopy years ago.  No history of GI malignancy.  She is not sure if she has ever had endoscopy.  Denies taking NSAIDs.  Does not drink alcohol.  Has never had a GI bleed before or blood transfusion.  She denies any abdominal pain.        Past Medical History:  Diagnosis Date   Arthritis    Asthma    Atrial fibrillation (HCC)    Cataract    Eczema    GERD (gastroesophageal reflux disease)    Gout of wrist    Heart murmur    Pacemaker    Pseudogout of ankle or foot    Thyroid disease    TIA (transient ischemic attack)     Patient Active Problem List   Diagnosis Date Noted   Acute GI bleeding 04/23/2021   Closed left hip fracture ([place]) 09/15/2020   Acute appendicitis 07/25/2019   Leukocytosis 06/29/2016   Hypogammaglobulinemia ([place]) 06/29/2016   Temporary cerebral vascular dysfunction 02/28/2014   DD (diverticular disease) 02/28/2014   Allergic rhinitis 02/28/2014   Allergy status to sulfonamides 02/28/2014   History of anticoagulant therapy 02/28/2014   Arthritis 02/28/2014   Airway hyperreactivity 02/28/2014   A-fib ([place]) 02/28/2014   Bilateral cataracts 02/28/2014    Dermatitis, eczematoid 02/28/2014   Allergy to environmental factors 02/28/2014   Acid reflux 02/28/2014   Polypharmacy 02/28/2014   HLD (hyperlipidemia) 02/28/2014   Adult hypothyroidism 02/28/2014   BP (high blood pressure) 02/28/2014   Arthritis, degenerative 02/28/2014   Degenerative arthritis of hip 10/09/2013   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   Neck rigid 05/30/2013   1st degree AV block 10/10/2012   History of biliary T-tube placement 06/14/2012   History of cardioversion 06/05/2012   Avascular necrosis of femoral head ([place]) 06/01/2012   Lumbar radiculopathy 06/01/2012   DDD (degenerative disc disease), lumbar 06/01/2012   H/O cardiac catheterization 10/20/2009    Past Surgical History:  Procedure Laterality Date   ABLATION     EYE SURGERY Bilateral    cataract   foot sugery     HIP ARTHROPLASTY     right   IMPLANTABLE CARDIOVERTER DEFIBRILLATOR (ICD) GENERATOR CHANGE Left 05/29/2019   Procedure: PACEMAKER CHANGE OUT;  Surgeon: Isaias Cowman, MD;  Location: [facility] ORS;  Service: Cardiovascular;  Laterality: Left;   INTRAMEDULLARY (IM) NAIL INTERTROCHANTERIC Left 09/16/2020   Procedure: INTRAMEDULLARY (IM) NAIL INTERTROCHANTRIC;  Surgeon: Renee Harder, MD;  Location: [facility] ORS;  Service: Orthopedics;  Laterality: Left;   PACEMAKER INSERTION      Prior to Admission medications   Medication Sig Start Date End Date Taking? Authorizing Provider  apixaban (ELIQUIS) 5 MG TABS tablet Take 5 mg by mouth 2 (two) times  daily.  06/26/13  Yes [provider]  atorvastatin (LIPITOR) 40 MG tablet Take 40 mg by mouth every evening.    Yes [provider]  budesonide-formoterol (SYMBICORT) 160-4.5 MCG/ACT inhaler Inhale 2 puffs into the lungs 2 (two) times daily.  09/09/13  Yes [provider]  Cholecalciferol (VITAMIN D3) 50 MCG (2000 UT) TABS Take 2,000 Units by mouth daily.    Yes [provider]  diclofenac Sodium  (VOLTAREN) 1 % GEL Apply 2 g topically daily as needed (pain).  08/22/13  Yes [provider]  docusate sodium (COLACE) 100 MG capsule Take 1 capsule (100 mg total) by mouth 2 (two) times daily. 09/19/20  Yes Bonnielee Haff, MD  famotidine (PEPCID) 20 MG tablet Take 20 mg by mouth 2 (two) times daily.    Yes [provider]  levothyroxine (SYNTHROID, LEVOTHROID) 112 MCG tablet Take 112 mcg by mouth daily before breakfast.   Yes [provider]  montelukast (SINGULAIR) 10 MG tablet Take 10 mg by mouth at bedtime.  12/27/13  Yes [provider]  Olopatadine HCl 0.7 % SOLN Apply 1 drop to eye at bedtime.    Yes [provider]  Omega-3 1000 MG CAPS Take 1,000 mg by mouth daily.    Yes [provider]  Skin Protectants, Misc. (EUCERIN) cream Apply topically as needed for dry skin. Apply to tops of feet 07/07/20  Yes Cuthriell, Charline Bills, PA-C  acetaminophen (TYLENOL) 650 MG CR tablet Take 650 mg by mouth every 8 (eight) hours as needed for pain.    [provider]  cephALEXin (KEFLEX) 500 MG capsule Take 1 capsule (500 mg total) by mouth 4 (four) times daily. Patient not taking: Reported on 04/23/2021 03/26/21   Marney Setting, NP  clotrimazole (LOTRIMIN) 1 % cream Apply 1 application topically 2 (two) times daily. Apply to toes of R foot Patient not taking: Reported on 04/23/2021 07/07/20   Cuthriell, Charline Bills, PA-C  polyethylene glycol (MIRALAX / GLYCOLAX) 17 g packet Take 17 g by mouth daily. Patient not taking: Reported on 04/23/2021 09/19/20   Bonnielee Haff, MD  predniSONE (STERAPRED UNI-PAK 21 TAB) 10 MG (21) TBPK tablet Take 6 tablets on day 1, 5 tablets day 2, 4 tablets day 3, 3 tablets day 4, 2 tablets day 5, 1 tablet day 6 Patient not taking: Reported on 04/23/2021 04/05/21   Margarette Canada, NP  traMADol (ULTRAM) 50 MG tablet Take 1 tablet (50 mg total) by mouth every 6 (six) hours as needed for moderate pain. Patient not taking:  Reported on 04/23/2021 01/06/21   Margarette Canada, NP  triamcinolone (KENALOG) 0.1 % Apply 1 application topically 2 (two) times daily. Patient not taking: Reported on 04/23/2021 07/14/20   Margarette Canada, NP    Allergies Fluticasone-salmeterol, Albuterol, Ciprofloxacin, Codeine, Colchicine, Other, Promethazine, Valacyclovir, Guaifenesin, and Sulfa antibiotics  Family History  Problem Relation Age of Onset   Leukemia Father    Lung cancer Sister    Breast cancer Neg Hx     Social History Social History   Tobacco Use   Smoking status: Never   Smokeless tobacco: Never  Vaping Use   Vaping Use: Never used  Substance Use Topics   Alcohol use: Not Currently    Alcohol/week: 0.0 standard drinks   Drug use: No    Review of Systems Constitutional: No fever. Eyes: No visual changes. ENT: No sore throat. Cardiovascular: + chest pain. Respiratory: + shortness of breath. Gastrointestinal: No nausea, vomiting, diarrhea.  Genitourinary: Negative for dysuria. Musculoskeletal: Negative for back pain. Skin: Negative for rash. Neurological: Negative for focal weakness or numbness.  ____________________________________________   PHYSICAL EXAM:  VITAL SIGNS: ED Triage Vitals  Enc Vitals Group     BP 04/23/21 0237 121/60     Pulse Rate 04/23/21 0237 62     Resp 04/23/21 0237 20     Temp 04/23/21 0237 97.9 F (36.6 C)     Temp Source 04/23/21 0237 Oral     SpO2 04/23/21 0237 93 %     Weight 04/23/21 0238 125 lb (56.7 kg)     Height 04/23/21 0238 5\' 4"  (1.626 m)     Head Circumference --      Peak Flow --      Pain Score 04/23/21 0238 0     Pain Loc --      Pain Edu? --      Excl. in [place]? --    CONSTITUTIONAL: Alert and oriented and responds appropriately to questions.  Elderly, pale in appearance HEAD: Normocephalic EYES: Conjunctivae clear, pupils appear equal, EOM appear intact ENT: normal nose; moist mucous membranes NECK: Supple, normal ROM CARD: RRR; S1 and S2 appreciated;  no murmurs, no clicks, no rubs, no gallops RESP: Normal chest excursion without splinting or tachypnea; breath sounds clear and equal bilaterally; no wheezes, no rhonchi, no rales, no hypoxia or respiratory distress, speaking full sentences ABD/GI: Normal bowel sounds; non-distended; soft, non-tender, no rebound, no guarding, no peritoneal signs, no hepatosplenomegaly RECTAL:  Normal rectal tone, no gross blood or melena, guaiac positive, no hemorrhoids appreciated, nontender rectal exam, no fecal impaction. Chaperone present. BACK: The back appears normal EXT: Normal ROM in all joints; no deformity noted, no edema; no cyanosis, no calf tenderness or calf swelling SKIN: Normal color for age and race; warm; no rash on exposed skin NEURO: Moves all extremities equally PSYCH: The patient's mood and manner are appropriate.  ____________________________________________   LABS (all labs ordered are listed, but only abnormal results are displayed)  Labs Reviewed  CBC WITH DIFFERENTIAL/PLATELET - Abnormal; Notable for the following components:      Result Value   RBC 2.75 (*)    Hemoglobin 5.4 (*)    HCT 18.3 (*)    MCV 66.5 (*)    MCH 19.6 (*)    MCHC 29.5 (*)    RDW 18.5 (*)    All other components within normal limits  COMPREHENSIVE METABOLIC PANEL - Abnormal; Notable for the following components:   Sodium 123 (*)    Chloride 93 (*)    Calcium 8.2 (*)    Albumin 3.1 (*)    All other components within normal limits  PROTIME-INR - Abnormal; Notable for the following components:   Prothrombin Time 17.9 (*)    INR 1.5 (*)    All other components within normal limits  IRON AND TIBC - Abnormal; Notable for the following components:   Iron 10 (*)    TIBC 456 (*)    Saturation Ratios 2 (*)    All other components within normal limits  FERRITIN - Abnormal; Notable for the following components:   Ferritin 7 (*)    All other components within normal limits  RETICULOCYTES - Abnormal;  Notable for the following components:   RBC. 2.78 (*)    Immature Retic Fract 19.4 (*)    All other components within normal limits  BRAIN NATRIURETIC PEPTIDE - Abnormal; Notable for the following components:   B Natriuretic Peptide  190.6 (*)    All other components within normal limits  RESP PANEL BY RT-PCR (FLU A&B, COVID) ARPGX2  FOLATE  VITAMIN B12  TYPE AND SCREEN  PREPARE RBC (CROSSMATCH)  TROPONIN I (HIGH SENSITIVITY)  TROPONIN I (HIGH SENSITIVITY)   ____________________________________________  EKG   Date: 04/23/2021 2:50 AM  Rate: 60  Rhythm: normal sinus rhythm  QRS Axis: normal  Intervals: normal  ST/T Wave abnormalities: normal  Conduction Disutrbances: none  Narrative Interpretation:     ____________________________________________  RADIOLOGY I, Murry Diaz, personally viewed and evaluated these images (plain radiographs) as part of my medical decision making, as well as reviewing the written report by the radiologist.  ED MD interpretation: CHF, cardiomegaly  Official radiology report(s): DG Chest Portable 1 View  Result Date: 04/23/2021 CLINICAL DATA:  Worsening shortness of breath. EXAM: PORTABLE CHEST 1 VIEW COMPARISON:  Sep 15, 2020 FINDINGS: A dual lead AICD is present with stable lead wire positioning. Mild diffusely increased interstitial lung markings are seen with moderate severity prominence of the pulmonary vasculature. Mild to moderate severity atelectasis and/or infiltrate is seen within the right lung base. The cardiac silhouette is mildly enlarged. Multilevel degenerative changes noted throughout the thoracic spine. IMPRESSION: 1. Cardiomegaly with moderate severity congestive heart failure. 2. Mild to moderate severity right basilar atelectasis and/or infiltrate. Electronically Signed   By: Virgina Norfolk M.D.   On: 04/23/2021 03:55    ____________________________________________   PROCEDURES  Procedure(s) performed (including Critical  Care):  Procedures  CRITICAL CARE Performed by: Cyril Mourning Rhyan Wolters   Total critical care time: 55 minutes  Critical care time was exclusive of separately billable procedures and treating other patients.  Critical care was necessary to treat or prevent imminent or life-threatening deterioration.  Critical care was time spent personally by me on the following activities: development of treatment plan with patient and/or surrogate as well as nursing, discussions with consultants, evaluation of patient's response to treatment, examination of patient, obtaining history from patient or surrogate, ordering and performing treatments and interventions, ordering and review of laboratory studies, ordering and review of radiographic studies, pulse oximetry and re-evaluation of patient's condition.  ____________________________________________   INITIAL IMPRESSION / ASSESSMENT AND PLAN / ED COURSE  As part of my medical decision making, I reviewed the following data within the [facility] notes reviewed and incorporated, Labs reviewed , EKG interpreted , Old EKG reviewed, Old chart reviewed, Radiograph reviewed , Discussed with admitting physician , and Notes from prior ED visits         Patient here with symptomatic anemia.  Was found to have hemoglobin of 5.3 four days ago while she was here in the emergency department.  Patient left without being seen due to long wait times.  Continues to have chest pain and shortness of breath.  Otherwise hemodynamically stable.  Reports several weeks of dark stools.  No hematochezia or melena exam but is strongly guaiac positive.  She is on Eliquis for atrial fibrillation.  Given guaiac positive stools, will start Protonix.  She denies any abdominal pain.  We will also give Kcentra to reverse her Eliquis.  Her hemoglobin today is 5.4.  Will give 3 units of packed red blood cells and discuss with medicine for admission.  She does have a cardiac  history but low suspicion that this is causing her shortness of breath but will obtain cardiac labs, interrogate her pacemaker, obtain chest x-ray and EKG.  Doubt PE, dissection.  No infectious symptoms to  suggest pneumonia, COVID or influenza.  She does not appear volume overloaded on exam.   ED PROGRESS  3:54 AM Pacemaker interrogated with no acute events.  Normal battery life.  Device detects fluid accumulation that has been present since July.  EKG reviewed today and shows a paced rhythm without other acute abnormalities.  Chest x-ray does also show cardiomegaly with signs of CHF.  We will add on BNP.  Peripherally she does not appear volume overloaded and is not hypoxic.  We will monitor closely for hypoxia, increased shortness of breath while getting transfusions.  4:46 AM  Discussed patient's case with hospitalist, Dr. Hal Hope.  I have recommended admission and patient (and family if present) agree with this plan. Admitting physician will place admission orders.   I reviewed all nursing notes, vitals, pertinent previous records and reviewed/interpreted all EKGs, lab and urine results, imaging (as available).  ____________________________________________   FINAL CLINICAL IMPRESSION(S) / ED DIAGNOSES  Final diagnoses:  Symptomatic anemia  Shortness of breath  Hyponatremia  Congestive heart failure, unspecified HF chronicity, unspecified heart failure type [facility])     ED Discharge Orders     None       *Please note:  [name] was evaluated in Emergency Department on 04/23/2021 for the symptoms described in the history of present illness. She was evaluated in the context of the global COVID-19 pandemic, which necessitated consideration that the patient might be at risk for infection with the SARS-CoV-2 virus that causes COVID-19. Institutional protocols and algorithms that pertain to the evaluation of patients at risk for COVID-19 are in a state of rapid change based  on information released by regulatory bodies including the CDC and federal and state organizations. These policies and algorithms were followed during the patient's care in the ED.  Some ED evaluations and interventions may be delayed as a result of limited staffing during and the pandemic.*   Note:  This document was prepared using Dragon voice recognition software and may include unintentional dictation errors.    Ismahan Lippman, Delice Bison, DO 04/23/21 [phone]

## 2021-04-23 NOTE — ED Notes (Signed)
[employer] called and report given to Dr Leonides Schanz. Faxed printout in patient's chart.

## 2021-04-24 ENCOUNTER — Encounter: Payer: Self-pay | Admitting: Internal Medicine

## 2021-04-24 LAB — TYPE AND SCREEN
ABO/RH(D): O POS
Antibody Screen: NEGATIVE
Unit division: 0
Unit division: 0
Unit division: 0
Unit division: 0
Unit division: 0

## 2021-04-24 LAB — BASIC METABOLIC PANEL
Anion gap: 6 (ref 5–15)
Anion gap: 9 (ref 5–15)
BUN: 12 mg/dL (ref 8–23)
BUN: 11 mg/dL (ref 8–23)
CO2: 24 mmol/L (ref 22–32)
CO2: 25 mmol/L (ref 22–32)
Calcium: 8 mg/dL — ABNORMAL LOW (ref 8.9–10.3)
Calcium: 8.1 mg/dL — ABNORMAL LOW (ref 8.9–10.3)
Chloride: 98 mmol/L (ref 98–111)
Chloride: 95 mmol/L — ABNORMAL LOW (ref 98–111)
Creatinine, Ser: 0.54 mg/dL (ref 0.44–1.00)
Creatinine, Ser: 0.71 mg/dL (ref 0.44–1.00)
GFR, Estimated: >60 mL/min (ref >60)
GFR, Estimated: >60 mL/min (ref >60)
Glucose, Bld: 69 mg/dL — ABNORMAL LOW (ref 70–99)
Glucose, Bld: 86 mg/dL (ref 70–99)
Potassium: 3.2 mmol/L — ABNORMAL LOW (ref 3.5–5.1)
Potassium: 3.6 mmol/L (ref 3.5–5.1)
Sodium: 128 mmol/L — ABNORMAL LOW (ref 135–145)
Sodium: 129 mmol/L — ABNORMAL LOW (ref 135–145)

## 2021-04-24 LAB — BPAM RBC
Blood Product Expiration Date: 202301022359
Blood Product Expiration Date: 202301022359
Blood Product Expiration Date: 202301182359
Blood Product Expiration Date: 202301252359
Blood Product Expiration Date: 202301252359
ISSUE DATE / TIME: 202212300515
ISSUE DATE / TIME: 202212301104
ISSUE DATE / TIME: 202212301326
ISSUE DATE / TIME: 202212301601
Unit Type and Rh: 5100
Unit Type and Rh: 5100
Unit Type and Rh: 5100
Unit Type and Rh: 5100
Unit Type and Rh: 5100

## 2021-04-24 LAB — CBC
HCT: 29.9 % — ABNORMAL LOW (ref 36.0–46.0)
HCT: 29.9 % — ABNORMAL LOW (ref 36.0–46.0)
HCT: 30 % — ABNORMAL LOW (ref 36.0–46.0)
HCT: 29.2 % — ABNORMAL LOW (ref 36.0–46.0)
Hemoglobin: 9.8 g/dL — ABNORMAL LOW (ref 12.0–15.0)
Hemoglobin: 9.5 g/dL — ABNORMAL LOW (ref 12.0–15.0)
Hemoglobin: 9.6 g/dL — ABNORMAL LOW (ref 12.0–15.0)
Hemoglobin: 9.5 g/dL — ABNORMAL LOW (ref 12.0–15.0)
MCH: 23.1 pg — ABNORMAL LOW (ref 26.0–34.0)
MCH: 22.5 pg — ABNORMAL LOW (ref 26.0–34.0)
MCH: 22.5 pg — ABNORMAL LOW (ref 26.0–34.0)
MCH: 23.1 pg — ABNORMAL LOW (ref 26.0–34.0)
MCHC: 32.8 g/dL (ref 30.0–36.0)
MCHC: 31.8 g/dL (ref 30.0–36.0)
MCHC: 32 g/dL (ref 30.0–36.0)
MCHC: 32.5 g/dL (ref 30.0–36.0)
MCV: 70.4 fL — ABNORMAL LOW (ref 80.0–100.0)
MCV: 70.9 fL — ABNORMAL LOW (ref 80.0–100.0)
MCV: 70.4 fL — ABNORMAL LOW (ref 80.0–100.0)
MCV: 70.9 fL — ABNORMAL LOW (ref 80.0–100.0)
Platelets: 263 10*3/uL (ref 150–400)
Platelets: 278 10*3/uL (ref 150–400)
Platelets: 282 10*3/uL (ref 150–400)
Platelets: 269 10*3/uL (ref 150–400)
RBC: 4.25 MIL/uL (ref 3.87–5.11)
RBC: 4.22 MIL/uL (ref 3.87–5.11)
RBC: 4.26 MIL/uL (ref 3.87–5.11)
RBC: 4.12 MIL/uL (ref 3.87–5.11)
RDW: 21.6 % — ABNORMAL HIGH (ref 11.5–15.5)
RDW: 21.4 % — ABNORMAL HIGH (ref 11.5–15.5)
RDW: 21.8 % — ABNORMAL HIGH (ref 11.5–15.5)
RDW: 22 % — ABNORMAL HIGH (ref 11.5–15.5)
WBC: 11.2 10*3/uL — ABNORMAL HIGH (ref 4.0–10.5)
WBC: 10.3 10*3/uL (ref 4.0–10.5)
WBC: 10.7 10*3/uL — ABNORMAL HIGH (ref 4.0–10.5)
WBC: 11.7 10*3/uL — ABNORMAL HIGH (ref 4.0–10.5)
nRBC: 0 % (ref 0.0–0.2)
nRBC: 0 % (ref 0.0–0.2)
nRBC: 0 % (ref 0.0–0.2)
nRBC: 0 % (ref 0.0–0.2)

## 2021-04-24 NOTE — Progress Notes (Signed)
[facility] Gastroenterology Inpatient Progress Note    Subjective: Patient seen for f/u GI bleed, anemia. Patient appears to have resolved bleeding at this point OFF eliquis. Denies pain, nausea, vomiting, diarrhea, further melena. Patient nephew, Florentina Addison, is at bedside.   Objective: Vital signs in last 24 hours: Temp:  [97.5 F (36.4 C)-98.6 F (37 C)] 97.7 F (36.5 C) (12/31 0431) Pulse Rate:  [57-64] 60 (12/31 0431) Resp:  [14-20] 16 (12/31 0431) BP: (125-175)/(60-106) 125/65 (12/31 0431) SpO2:  [95 %-100 %] 95 % (12/31 0431) Blood pressure 125/65, pulse 60, temperature 97.7 F (36.5 C), temperature source Oral, resp. rate 16, height 5\' 4"  (1.626 m), weight 56.7 kg, SpO2 95 %.    Intake/Output from previous day: 12/30 0701 - 12/31 0700 In: 1239.1 [I.V.:227.1; Blood:1012] Out: 2150 [Urine:2150]  Intake/Output this shift: Total I/O In: 240 [P.O.:240] Out: -    Gen: NAD. Appears comfortable.  HEENT: [place]/AT. PERRLA. Normal external ear exam. Poor dentition.  Chest: CTA, no wheezes.  CV: RR nl S1, S2. No gallops.  Abd: soft, nt, nd. BS+  Ext: no edema. Pulses 2+  Neuro: Alert and oriented. Judgement appears normal. Nonfocal.   Lab Results: Results for orders placed or performed during the hospital encounter of 04/23/21 (from the past 24 hour(s))  CBC     Status: Abnormal   Collection Time: 04/23/21  2:52 PM  Result Value Ref Range   WBC 10.7 (H) 4.0 - 10.5 K/uL   RBC 4.06 3.87 - 5.11 MIL/uL   Hemoglobin 8.7 (L) 12.0 - 15.0 g/dL   HCT 28.3 (L) 36.0 - 46.0 %   MCV 69.7 (L) 80.0 - 100.0 fL   MCH 21.4 (L) 26.0 - 34.0 pg   MCHC 30.7 30.0 - 36.0 g/dL   RDW 22.1 (H) 11.5 - 15.5 %   Platelets 322 150 - 400 K/uL   nRBC 0.0 0.0 - 0.2 %  Basic metabolic panel     Status: Abnormal   Collection Time: 04/23/21  2:52 PM  Result Value Ref Range   Sodium 130 (L) 135 - 145 mmol/L   Potassium 3.2 (L) 3.5 - 5.1 mmol/L   Chloride 95 (L) 98 - 111 mmol/L   CO2 27  22 - 32 mmol/L   Glucose, Bld 85 70 - 99 mg/dL   BUN 14 8 - 23 mg/dL   Creatinine, Ser 0.59 0.44 - 1.00 mg/dL   Calcium 8.2 (L) 8.9 - 10.3 mg/dL   GFR, Estimated >60 >60 mL/min   Anion gap 8 5 - 15  Prepare RBC (crossmatch)     Status: None   Collection Time: 04/23/21  4:00 PM  Result Value Ref Range   Order Confirmation      ORDER PROCESSED BY BLOOD BANK Performed at [facility], [address] [place], [place] [address]   CBC     Status: Abnormal   Collection Time: 04/23/21  7:39 PM  Result Value Ref Range   WBC 11.2 (H) 4.0 - 10.5 K/uL   RBC 4.39 3.87 - 5.11 MIL/uL   Hemoglobin 10.1 (L) 12.0 - 15.0 g/dL   HCT 31.1 (L) 36.0 - 46.0 %   MCV 70.8 (L) 80.0 - 100.0 fL   MCH 23.0 (L) 26.0 - 34.0 pg   MCHC 32.5 30.0 - 36.0 g/dL   RDW 21.8 (H) 11.5 - 15.5 %   Platelets 275 150 - 400 K/uL   nRBC 0.0 0.0 - 0.2 %  Basic metabolic panel  Status: Abnormal   Collection Time: 04/23/21 11:20 PM  Result Value Ref Range   Sodium 128 (L) 135 - 145 mmol/L   Potassium 3.2 (L) 3.5 - 5.1 mmol/L   Chloride 98 98 - 111 mmol/L   CO2 24 22 - 32 mmol/L   Glucose, Bld 69 (L) 70 - 99 mg/dL   BUN 12 8 - 23 mg/dL   Creatinine, Ser 0.54 0.44 - 1.00 mg/dL   Calcium 8.0 (L) 8.9 - 10.3 mg/dL   GFR, Estimated >60 >60 mL/min   Anion gap 6 5 - 15  CBC     Status: Abnormal   Collection Time: 04/24/21  1:03 AM  Result Value Ref Range   WBC 11.2 (H) 4.0 - 10.5 K/uL   RBC 4.25 3.87 - 5.11 MIL/uL   Hemoglobin 9.8 (L) 12.0 - 15.0 g/dL   HCT 29.9 (L) 36.0 - 46.0 %   MCV 70.4 (L) 80.0 - 100.0 fL   MCH 23.1 (L) 26.0 - 34.0 pg   MCHC 32.8 30.0 - 36.0 g/dL   RDW 21.6 (H) 11.5 - 15.5 %   Platelets 263 150 - 400 K/uL   nRBC 0.0 0.0 - 0.2 %  CBC     Status: Abnormal   Collection Time: 04/24/21  6:53 AM  Result Value Ref Range   WBC 10.3 4.0 - 10.5 K/uL   RBC 4.22 3.87 - 5.11 MIL/uL   Hemoglobin 9.5 (L) 12.0 - 15.0 g/dL   HCT 29.9 (L) 36.0 - 46.0 %   MCV 70.9 (L) 80.0 - 100.0 fL   MCH 22.5  (L) 26.0 - 34.0 pg   MCHC 31.8 30.0 - 36.0 g/dL   RDW 21.4 (H) 11.5 - 15.5 %   Platelets 278 150 - 400 K/uL   nRBC 0.0 0.0 - 0.2 %  Basic metabolic panel     Status: Abnormal   Collection Time: 04/24/21  6:53 AM  Result Value Ref Range   Sodium 129 (L) 135 - 145 mmol/L   Potassium 3.6 3.5 - 5.1 mmol/L   Chloride 95 (L) 98 - 111 mmol/L   CO2 25 22 - 32 mmol/L   Glucose, Bld 86 70 - 99 mg/dL   BUN 11 8 - 23 mg/dL   Creatinine, Ser 0.71 0.44 - 1.00 mg/dL   Calcium 8.1 (L) 8.9 - 10.3 mg/dL   GFR, Estimated >60 >60 mL/min   Anion gap 9 5 - 15     Recent Labs    04/23/21 1939 04/24/21 0103 04/24/21 0653  WBC 11.2* 11.2* 10.3  HGB 10.1* 9.8* 9.5*  HCT 31.1* 29.9* 29.9*  PLT 275 263 278   BMET Recent Labs    04/23/21 1452 04/23/21 2320 04/24/21 0653  NA 130* 128* 129*  K 3.2* 3.2* 3.6  CL 95* 98 95*  CO2 27 24 25   GLUCOSE 85 69* 86  BUN 14 12 11   CREATININE 0.59 0.54 0.71  CALCIUM 8.2* 8.0* 8.1*   LFT Recent Labs    04/23/21 0248  PROT 6.5  ALBUMIN 3.1*  AST 21  ALT 17  ALKPHOS 109  BILITOT 0.7   PT/INR Recent Labs    04/23/21 0248  LABPROT 17.9*  INR 1.5*   Hepatitis Panel No results for input(s): HEPBSAG, HCVAB, HEPAIGM, HEPBIGM in the last 72 hours. C-Diff No results for input(s): CDIFFTOX in the last 72 hours. No results for input(s): CDIFFPCR in the last 72 hours.   Studies/Results: DG Chest Portable 1 View  Result Date: 04/23/2021 CLINICAL DATA:  Worsening shortness of breath. EXAM: PORTABLE CHEST 1 VIEW COMPARISON:  Sep 15, 2020 FINDINGS: A dual lead AICD is present with stable lead wire positioning. Mild diffusely increased interstitial lung markings are seen with moderate severity prominence of the pulmonary vasculature. Mild to moderate severity atelectasis and/or infiltrate is seen within the right lung base. The cardiac silhouette is mildly enlarged. Multilevel degenerative changes noted throughout the thoracic spine. IMPRESSION: 1.  Cardiomegaly with moderate severity congestive heart failure. 2. Mild to moderate severity right basilar atelectasis and/or infiltrate. Electronically Signed   By: Virgina Norfolk M.D.   On: 04/23/2021 03:55    Scheduled Inpatient Medications:    sodium chloride   Intravenous Once   atorvastatin  40 mg Oral QPM   cholecalciferol  2,000 Units Oral Daily   docusate sodium  100 mg Oral BID   ferrous sulfate  325 mg Oral BID WC   levothyroxine  112 mcg Oral QAC breakfast   mometasone-formoterol  2 puff Inhalation BID   montelukast  10 mg Oral QHS   olopatadine  1 drop Both Eyes QHS   omega-3 acid ethyl esters  1,000 mg Oral Daily   [START ON 04/26/2021] pantoprazole  40 mg Intravenous Q12H   sodium chloride  2 g Oral BID WC    Continuous Inpatient Infusions:    sodium chloride Stopped (04/23/21 0405)   pantoprazole 8 mg/hr (04/24/21 0030)    PRN Inpatient Medications:  acetaminophen, diazepam, hydrALAZINE, ipratropium, methocarbamol, ondansetron (ZOFRAN) IV, polyethylene glycol  Miscellaneous: N/A  Assessment:  Melena - likely UGI source, though exact source unknown. Possible peptic ulcer disease. Profound, symptomatic anemia secondary to gastrointestinal blood loss. Improved after 3 u prbc transfusion. Chronic anticoagulation - Held (Eliquis). Hemoccult positive stool. Atrial fibrillation, heart rate controlled.  Plan:  Clear liquid diet today. EGD and colonoscopy tomorrow. The patient and nephew understand the nature of the planned procedure, indications, risks, alternatives and potential complications including but not limited to bleeding, infection, perforation, damage to internal organs and possible oversedation/side effects from anesthesia. The patient and nephew (Mr. Norberto Sorenson) agree and give consent to proceed.  Please refer to procedure notes for findings, recommendations and patient disposition/instructions. 4. Continue holding anticoagulation as deemed medically  feasible.  Osker Ayoub K. Alice Reichert, M.D. 04/24/2021, 11:20 AM

## 2021-04-24 NOTE — Progress Notes (Addendum)
Progress Note    [name]  ERX:[id number] DOB: 12/28/1924  DOA: 04/23/2021 PCP: Ezequiel Kayser, MD (Inactive)      Brief Narrative:    Medical records reviewed and are as summarized below:  [name] is a 87 y.o. female with medical history significant of atrial fibrillation on Eliquis, hypertension, hyperlipidemia, asthma, TIA, GERD, hypothyroidism, sinoatrial node dysfunction (s/p of AICD placement), s/p biliary T-tube placement, who presented to the hospital with melena and shortness of breath.  She was found to have severe anemia with hemoglobin of 5.4 on admission.  She was admitted to the hospital for acute blood loss anemia secondary to GI bleeding.  She was treated with IV Kcentra, IV Protonix, IV iron and 3 units of packed red blood cells.     Assessment/Plan:   Principal Problem:   Acute GI bleeding Active Problems:   HLD (hyperlipidemia)   Adult hypothyroidism   HTN (hypertension)   Asthma   TIA (transient ischemic attack)   Atrial fibrillation, chronic (HCC)   Acute blood loss anemia   Iron deficiency anemia    Body mass index is 21.46 kg/m.  GI bleeding, melena: Eliquis has been held.  Continue IV Protonix.  S/p treatment with IV Kcentra.  Plan for EGD and colonoscopy tomorrow.  Acute blood loss anemia, iron deficiency anemia: Hemoglobin improved s/p transfusion with 3 units of PRBCs.  S/p treatment with IV ferumoxytol.  Monitor H&H.  Shortness of breath was likely due to severe anemia.  2D echo is pending.  Hyponatremia: Continue salt tablets  History of TIA and atrial fibrillation: Eliquis has been held.  Other comorbidities include hyperlipidemia, hypothyroidism, hypertension, asthma   Diet Order             Diet clear liquid Room service appropriate? Yes; Fluid consistency: Thin  Diet effective 0500 tomorrow           Diet full liquid Room service appropriate? Yes; Fluid consistency: Thin  Diet effective now                       Consultants: Gastroenterologist  Procedures: None    Medications:    sodium chloride   Intravenous Once   atorvastatin  40 mg Oral QPM   cholecalciferol  2,000 Units Oral Daily   docusate sodium  100 mg Oral BID   ferrous sulfate  325 mg Oral BID WC   levothyroxine  112 mcg Oral QAC breakfast   mometasone-formoterol  2 puff Inhalation BID   montelukast  10 mg Oral QHS   olopatadine  1 drop Both Eyes QHS   omega-3 acid ethyl esters  1,000 mg Oral Daily   [START ON 04/26/2021] pantoprazole  40 mg Intravenous Q12H   sodium chloride  2 g Oral BID WC   Continuous Infusions:  sodium chloride Stopped (04/23/21 0405)   pantoprazole 8 mg/hr (04/24/21 1124)     Anti-infectives (From admission, onward)    None              Family Communication/Anticipated D/C date and plan/Code Status   DVT prophylaxis: SCDs Start: 04/23/21 0731     Code Status: Full Code  Family Communication: Toney Reil, nephew, at the bedside Disposition Plan: Plan to discharge home in 1 to 2 days   Status is: Inpatient  Remains inpatient appropriate because: Awaiting endoscopic work-up for GI bleed           Subjective:   Interval events  noted.  No vomiting, melena or abdominal pain. Toney Reil, nephew, and [place], Therapist, sports, were at the bedside.  Objective:    Vitals:   04/23/21 1651 04/23/21 1854 04/23/21 1945 04/24/21 0431  BP: (!) 175/76 (!) 141/81 137/69 125/65  Pulse: 64 63 60 60  Resp: 18 18 20 16   Temp: 98.6 F (37 C) (!) 97.5 F (36.4 C) 97.6 F (36.4 C) 97.7 F (36.5 C)  TempSrc:  Oral  Oral  SpO2: 100% 100% 96% 95%  Weight:      Height:       No data found.   Intake/Output Summary (Last 24 hours) at 04/24/2021 1456 Last data filed at 04/24/2021 1030 Gross per 24 hour  Intake 789.09 ml  Output 600 ml  Net 189.09 ml   Filed Weights   04/23/21 0238  Weight: 56.7 kg    Exam:  GEN: NAD SKIN: No rash EYES: EOMI ENT: MMM CV:  RRR PULM: CTA B ABD: soft, ND, NT, +BS CNS: AAO x 3, non focal EXT: No edema or tenderness        Data Reviewed:   I have personally reviewed following labs and imaging studies:  Labs: Labs show the following:   Basic Metabolic Panel: Recent Labs  Lab 04/23/21 0248 04/23/21 0838 04/23/21 1452 04/23/21 2320 04/24/21 0653  NA 123* 126* 130* 128* 129*  K 3.6 3.6 3.2* 3.2* 3.6  CL 93* 94* 95* 98 95*  CO2 25 25 27 24 25   GLUCOSE 95 93 85 69* 86  BUN 18 15 14 12 11   CREATININE 0.58 0.52 0.59 0.54 0.71  CALCIUM 8.2* 8.0* 8.2* 8.0* 8.1*   GFR Estimated Creatinine Clearance: 37.1 mL/min (by C-G formula based on SCr of 0.71 mg/dL). Liver Function Tests: Recent Labs  Lab 04/23/21 0248  AST 21  ALT 17  ALKPHOS 109  BILITOT 0.7  PROT 6.5  ALBUMIN 3.1*   No results for input(s): LIPASE, AMYLASE in the last 168 hours. No results for input(s): AMMONIA in the last 168 hours. Coagulation profile Recent Labs  Lab 04/23/21 0248  INR 1.5*    CBC: Recent Labs  Lab 04/23/21 0248 04/23/21 0838 04/23/21 1452 04/23/21 1939 04/24/21 0103 04/24/21 0653  WBC 9.7 8.5 10.7* 11.2* 11.2* 10.3  NEUTROABS 6.9  --   --   --   --   --   HGB 5.4* 6.5* 8.7* 10.1* 9.8* 9.5*  HCT 18.3* 21.2* 28.3* 31.1* 29.9* 29.9*  MCV 66.5* 66.7* 69.7* 70.8* 70.4* 70.9*  PLT 344 310 322 275 263 278   Cardiac Enzymes: No results for input(s): CKTOTAL, CKMB, CKMBINDEX, TROPONINI in the last 168 hours. BNP (last 3 results) No results for input(s): PROBNP in the last 8760 hours. CBG: No results for input(s): GLUCAP in the last 168 hours. D-Dimer: No results for input(s): DDIMER in the last 72 hours. Hgb A1c: No results for input(s): HGBA1C in the last 72 hours. Lipid Profile: No results for input(s): CHOL, HDL, LDLCALC, TRIG, CHOLHDL, LDLDIRECT in the last 72 hours. Thyroid function studies: No results for input(s): TSH, T4TOTAL, T3FREE, THYROIDAB in the last 72 hours.  Invalid input(s):  FREET3 Anemia work up: Recent Labs    04/23/21 0248  VITAMINB12 694  FOLATE 43.0  FERRITIN 7*  TIBC 456*  IRON 10*  RETICCTPCT 3.0   Sepsis Labs: Recent Labs  Lab 04/23/21 1452 04/23/21 1939 04/24/21 0103 04/24/21 0653  WBC 10.7* 11.2* 11.2* 10.3    Microbiology Recent Results (from the past  240 hour(s))  Resp Panel by RT-PCR (Flu A&B, Covid) Nasopharyngeal Swab     Status: None   Collection Time: 04/23/21  2:48 AM   Specimen: Nasopharyngeal Swab; Nasopharyngeal(NP) swabs in vial transport medium  Result Value Ref Range Status   SARS Coronavirus 2 by RT PCR NEGATIVE NEGATIVE Final    Comment: (NOTE) SARS-CoV-2 target nucleic acids are NOT DETECTED.  The SARS-CoV-2 RNA is generally detectable in upper respiratory specimens during the acute phase of infection. The lowest concentration of SARS-CoV-2 viral copies this assay can detect is 138 copies/mL. A negative result does not preclude SARS-Cov-2 infection and should not be used as the sole basis for treatment or other patient management decisions. A negative result may occur with  improper specimen collection/handling, submission of specimen other than nasopharyngeal swab, presence of viral mutation(s) within the areas targeted by this assay, and inadequate number of viral copies(<138 copies/mL). A negative result must be combined with clinical observations, patient history, and epidemiological information. The expected result is Negative.  Fact Sheet for Patients:  EntrepreneurPulse.com.au  Fact Sheet for Healthcare Providers:  IncredibleEmployment.be  This test is no t yet approved or cleared by the [place] FDA and  has been authorized for detection and/or diagnosis of SARS-CoV-2 by FDA under an Emergency Use Authorization (EUA). This EUA will remain  in effect (meaning this test can be used) for the duration of the COVID-19 declaration under Section 564(b)(1) of the  Act, 21 U.S.C.section 360bbb-3(b)(1), unless the authorization is terminated  or revoked sooner.       Influenza A by PCR NEGATIVE NEGATIVE Final   Influenza B by PCR NEGATIVE NEGATIVE Final    Comment: (NOTE) The Xpert Xpress SARS-CoV-2/FLU/RSV plus assay is intended as an aid in the diagnosis of influenza from Nasopharyngeal swab specimens and should not be used as a sole basis for treatment. Nasal washings and aspirates are unacceptable for Xpert Xpress SARS-CoV-2/FLU/RSV testing.  Fact Sheet for Patients: EntrepreneurPulse.com.au  Fact Sheet for Healthcare Providers: IncredibleEmployment.be  This test is not yet approved or cleared by the [place] FDA and has been authorized for detection and/or diagnosis of SARS-CoV-2 by FDA under an Emergency Use Authorization (EUA). This EUA will remain in effect (meaning this test can be used) for the duration of the COVID-19 declaration under Section 564(b)(1) of the Act, 21 U.S.C. section 360bbb-3(b)(1), unless the authorization is terminated or revoked.  Performed at [facility], [address] [place], [place] [address]     Procedures and diagnostic studies:  DG Chest Portable 1 View  Result Date: 04/23/2021 CLINICAL DATA:  Worsening shortness of breath. EXAM: PORTABLE CHEST 1 VIEW COMPARISON:  Sep 15, 2020 FINDINGS: A dual lead AICD is present with stable lead wire positioning. Mild diffusely increased interstitial lung markings are seen with moderate severity prominence of the pulmonary vasculature. Mild to moderate severity atelectasis and/or infiltrate is seen within the right lung base. The cardiac silhouette is mildly enlarged. Multilevel degenerative changes noted throughout the thoracic spine. IMPRESSION: 1. Cardiomegaly with moderate severity congestive heart failure. 2. Mild to moderate severity right basilar atelectasis and/or infiltrate. Electronically Signed   By:  Virgina Norfolk M.D.   On: 04/23/2021 03:55               LOS: 1 day   Lanita Stammen  [facility]   Pager on [email or web address]. If 7PM-7AM, please contact night-coverage at [email or web address]     04/24/2021, 2:56 PM

## 2021-04-25 ENCOUNTER — Inpatient Hospital Stay
Admit: 2021-04-25 | Discharge: 2021-04-25 | Disposition: A | Discharge status: Home / Self Care | Payer: Medicare Other | Attending: Internal Medicine | Admitting: Internal Medicine

## 2021-04-25 DIAGNOSIS — K922 Gastrointestinal hemorrhage, unspecified: Secondary | ICD-10-CM | POA: Diagnosis not present

## 2021-04-25 LAB — BASIC METABOLIC PANEL
Anion gap: 9 (ref 5–15)
BUN: 10 mg/dL (ref 8–23)
CO2: 23 mmol/L (ref 22–32)
Calcium: 8.3 mg/dL — ABNORMAL LOW (ref 8.9–10.3)
Chloride: 97 mmol/L — ABNORMAL LOW (ref 98–111)
Creatinine, Ser: 0.59 mg/dL (ref 0.44–1.00)
GFR, Estimated: >60 mL/min (ref >60)
Glucose, Bld: 82 mg/dL (ref 70–99)
Potassium: 3.4 mmol/L — ABNORMAL LOW (ref 3.5–5.1)
Sodium: 129 mmol/L — ABNORMAL LOW (ref 135–145)

## 2021-04-25 LAB — CBC
HCT: 30.9 % — ABNORMAL LOW (ref 36.0–46.0)
Hemoglobin: 9.8 g/dL — ABNORMAL LOW (ref 12.0–15.0)
MCH: 22.8 pg — ABNORMAL LOW (ref 26.0–34.0)
MCHC: 31.7 g/dL (ref 30.0–36.0)
MCV: 72 fL — ABNORMAL LOW (ref 80.0–100.0)
Platelets: 286 10*3/uL (ref 150–400)
RBC: 4.29 MIL/uL (ref 3.87–5.11)
RDW: 22.2 % — ABNORMAL HIGH (ref 11.5–15.5)
WBC: 13 10*3/uL — ABNORMAL HIGH (ref 4.0–10.5)
nRBC: 0 % (ref 0.0–0.2)

## 2021-04-25 MED ORDER — POLYETHYLENE GLYCOL 3350 17 GM/SCOOP PO POWD
1.0000 | Freq: Once | ORAL | Status: AC
  Administered 2021-04-25: 255 g via ORAL
  Filled 2021-04-25: qty 255

## 2021-04-25 MED ORDER — BISACODYL 5 MG PO TBEC
20.0000 mg | DELAYED_RELEASE_TABLET | Freq: Once | ORAL | Status: AC
  Administered 2021-04-25: 20 mg via ORAL
  Filled 2021-04-25: qty 4

## 2021-04-25 NOTE — Progress Notes (Signed)
*  PRELIMINARY RESULTS* Echocardiogram 2D Echocardiogram has been performed.  Claretta Fraise 04/25/2021, 5:38 PM

## 2021-04-25 NOTE — Progress Notes (Signed)
[place] [facility] [place] NAME: [name]    MR#:  [id number]  DATE OF BIRTH:  Sep 13, 1924  SUBJECTIVE:  patient a bit frustrated earlier today wants to go home worried about her dogs. She tells me she is been here too many days. I explained to her she is just been two days since admitted. Denies any bright red blood per rectum or any vomiting.  REVIEW OF SYSTEMS:   Review of Systems  Constitutional:  Negative for chills, fever and weight loss.  HENT:  Negative for ear discharge, ear pain and nosebleeds.   Eyes:  Negative for blurred vision, pain and discharge.  Respiratory:  Negative for sputum production, shortness of breath, wheezing and stridor.   Cardiovascular:  Negative for chest pain, palpitations, orthopnea and PND.  Gastrointestinal:  Negative for abdominal pain, diarrhea, nausea and vomiting.  Genitourinary:  Negative for frequency and urgency.  Musculoskeletal:  Negative for back pain and joint pain.  Neurological:  Positive for weakness. Negative for sensory change, speech change and focal weakness.  Psychiatric/Behavioral:  Negative for depression and hallucinations. The patient is not nervous/anxious.   Tolerating Diet: Tolerating PT:   DRUG ALLERGIES:   Allergies  Allergen Reactions   Fluticasone-Salmeterol Anaphylaxis   Albuterol     Other reaction(s): Other (See Comments) It burns and makes her cough "too strong for me or something"   Ciprofloxacin Other (See Comments) and Hives    Other reaction(s): Unknown Does not remember   Codeine Hives   Colchicine Diarrhea   Other Other (See Comments)   Promethazine Other (See Comments)    Unable to speak. Loss muscle control (PHENGERAN)   Valacyclovir     Other reaction(s): Muscle Pain Could hardly move   Guaifenesin Palpitations   Sulfa Antibiotics Rash and Hives    VITALS:  Blood pressure (!) 164/74, pulse 60, temperature (!) 97.5 F (36.4 C), temperature source  Oral, resp. rate 16, height 5\' 4"  (1.626 m), weight 56.7 kg, SpO2 (!) 89 %.  PHYSICAL EXAMINATION:   Physical Exam  GENERAL:  88 y.o.-year-old patient lying in the bed with no acute distress.  HEENT: Head atraumatic, normocephalic. Oropharynx and nasopharynx clear.  LUNGS: Normal breath sounds bilaterally, no wheezing, rales, rhonchi. No use of accessory muscles of respiration.  CARDIOVASCULAR: S1, S2 normal. No murmurs, rubs, or gallops.  ABDOMEN: Soft, nontender, nondistended. Bowel sounds present. No organomegaly or mass.  EXTREMITIES: No cyanosis, clubbing or edema b/l.    NEUROLOGIC: nonfocal PSYCHIATRIC:  patient is alert and oriented x 3.  SKIN: No obvious rash, lesion, or ulcer.   LABORATORY PANEL:  CBC Recent Labs  Lab 04/25/21 0123  WBC 13.0*  HGB 9.8*  HCT 30.9*  PLT 286    Chemistries  Recent Labs  Lab 04/23/21 0248 04/23/21 0838 04/25/21 0528  NA 123*   < > 129*  K 3.6   < > 3.4*  CL 93*   < > 97*  CO2 25   < > 23  GLUCOSE 95   < > 82  BUN 18   < > 10  CREATININE 0.58   < > 0.59  CALCIUM 8.2*   < > 8.3*  AST 21  --   --   ALT 17  --   --   ALKPHOS 109  --   --   BILITOT 0.7  --   --    < > = values in this interval not displayed.  Cardiac Enzymes No results for input(s): TROPONINI in the last 168 hours. RADIOLOGY:  No results found. ASSESSMENT AND PLAN:  [name] is a 88 y.o. female with medical history significant of atrial fibrillation on Eliquis, hypertension, hyperlipidemia, asthma, TIA, GERD, hypothyroidism, sinoatrial node dysfunction (s/p of AICD placement), s/p biliary T-tube placement, who presented to the hospital with melena and shortness of breath.  Melena/G.I. bleed -- came in with hemoglobin of 5.4 status post three unit blood transfusion -- hemoglobin 9.8 -- denies any further bleeding or bright red blood -- seen by G.I. Dr. Alice Reichert plans for EGD colonoscopy tomorrow -- hold eliquis -- continue IV Protonix  history  of TIA and a fib -- rate controlled -- eliquis on hold  Hypothyroidism -- continue Synthroid  Hyponatremia -- patient came in with sodium of 123---129 --cont salt tabs for couple days --mentation stable        Procedures: Family communication : none Consults : G.I. CODE STATUS: full DVT Prophylaxis : SCD Level of care: Telemetry Medical Status is: Inpatient  Remains inpatient appropriate because: anemia workup        TOTAL TIME TAKING CARE OF THIS PATIENT: 25 minutes.  >50% time spent on counselling and coordination of care  Note: This dictation was prepared with Dragon dictation along with smaller phrase technology. Any transcriptional errors that result from this process are unintentional.  [name] M.D    [facility]   CC: Primary care physician; Ezequiel Kayser, MD (Inactive) Patient ID: [name], female   DOB: 18-Jan-1925, 88 y.o.   MRN: [id number]

## 2021-04-25 NOTE — Progress Notes (Signed)
[facility]astroenterology Inpatient Progress Note    Subjective: Patient seen for f/u GI bleed, anemia. Patient appears to have resolved bleeding at this point OFF eliquis. Denies pain, nausea, vomiting, diarrhea, further melena. No family bedside. No new complaints.   Objective: Vital signs in last 24 hours: Temp:  [97.5 F (36.4 C)-98.1 F (36.7 C)] 97.5 F (36.4 C) (01/01 0951) Pulse Rate:  [60-70] 60 (01/01 0951) Resp:  [16-18] 16 (01/01 0951) BP: (145-164)/(70-74) 164/74 (01/01 0951) SpO2:  [89 %-100 %] 89 % (01/01 0951) Blood pressure (!) 164/74, pulse 60, temperature (!) 97.5 F (36.4 C), temperature source Oral, resp. rate 16, height 5\' 4"  (1.626 m), weight 56.7 kg, SpO2 (!) 89 %.   PHYSICAL EXAM:   Gen: NAD. Appears comfortable.  HEENT: [place]/AT. PERRLA. Normal external ear exam. Poor dentition.  Chest: CTA, no wheezes.  CV: RR nl S1, S2. No gallops.  Abd: soft, nt, nd. BS+  Ext: no edema. Pulses 2+  Neuro: Alert and oriented. Judgement appears normal. Nonfocal.   Lab Results: Results for orders placed or performed during the hospital encounter of 04/23/21 (from the past 24 hour(s))  CBC     Status: Abnormal   Collection Time: 04/24/21  1:48 PM  Result Value Ref Range   WBC 10.7 (H) 4.0 - 10.5 K/uL   RBC 4.26 3.87 - 5.11 MIL/uL   Hemoglobin 9.6 (L) 12.0 - 15.0 g/dL   HCT 30.0 (L) 36.0 - 46.0 %   MCV 70.4 (L) 80.0 - 100.0 fL   MCH 22.5 (L) 26.0 - 34.0 pg   MCHC 32.0 30.0 - 36.0 g/dL   RDW 21.8 (H) 11.5 - 15.5 %   Platelets 282 150 - 400 K/uL   nRBC 0.0 0.0 - 0.2 %  CBC     Status: Abnormal   Collection Time: 04/24/21  7:16 PM  Result Value Ref Range   WBC 11.7 (H) 4.0 - 10.5 K/uL   RBC 4.12 3.87 - 5.11 MIL/uL   Hemoglobin 9.5 (L) 12.0 - 15.0 g/dL   HCT 29.2 (L) 36.0 - 46.0 %   MCV 70.9 (L) 80.0 - 100.0 fL   MCH 23.1 (L) 26.0 - 34.0 pg   MCHC 32.5 30.0 - 36.0 g/dL   RDW 22.0 (H) 11.5 - 15.5 %   Platelets 269 150 - 400 K/uL   nRBC 0.0 0.0 -  0.2 %  CBC     Status: Abnormal   Collection Time: 04/25/21  1:23 AM  Result Value Ref Range   WBC 13.0 (H) 4.0 - 10.5 K/uL   RBC 4.29 3.87 - 5.11 MIL/uL   Hemoglobin 9.8 (L) 12.0 - 15.0 g/dL   HCT 30.9 (L) 36.0 - 46.0 %   MCV 72.0 (L) 80.0 - 100.0 fL   MCH 22.8 (L) 26.0 - 34.0 pg   MCHC 31.7 30.0 - 36.0 g/dL   RDW 22.2 (H) 11.5 - 15.5 %   Platelets 286 150 - 400 K/uL   nRBC 0.0 0.0 - 0.2 %  Basic metabolic panel     Status: Abnormal   Collection Time: 04/25/21  5:28 AM  Result Value Ref Range   Sodium 129 (L) 135 - 145 mmol/L   Potassium 3.4 (L) 3.5 - 5.1 mmol/L   Chloride 97 (L) 98 - 111 mmol/L   CO2 23 22 - 32 mmol/L   Glucose, Bld 82 70 - 99 mg/dL   BUN 10 8 - 23 mg/dL   Creatinine, Ser 0.59 0.44 -  1.00 mg/dL   Calcium 8.3 (L) 8.9 - 10.3 mg/dL   GFR, Estimated >60 >60 mL/min   Anion gap 9 5 - 15     Recent Labs    04/24/21 1348 04/24/21 1916 04/25/21 0123  WBC 10.7* 11.7* 13.0*  HGB 9.6* 9.5* 9.8*  HCT 30.0* 29.2* 30.9*  PLT 282 269 286   BMET Recent Labs    04/23/21 2320 04/24/21 0653 04/25/21 0528  NA 128* 129* 129*  K 3.2* 3.6 3.4*  CL 98 95* 97*  CO2 24 25 23   GLUCOSE 69* 86 82  BUN 12 11 10   CREATININE 0.54 0.71 0.59  CALCIUM 8.0* 8.1* 8.3*   LFT Recent Labs    04/23/21 0248  PROT 6.5  ALBUMIN 3.1*  AST 21  ALT 17  ALKPHOS 109  BILITOT 0.7   PT/INR Recent Labs    04/23/21 0248  LABPROT 17.9*  INR 1.5*    Scheduled Inpatient Medications:    atorvastatin  40 mg Oral QPM   cholecalciferol  2,000 Units Oral Daily   docusate sodium  100 mg Oral BID   ferrous sulfate  325 mg Oral BID WC   levothyroxine  112 mcg Oral QAC breakfast   mometasone-formoterol  2 puff Inhalation BID   montelukast  10 mg Oral QHS   olopatadine  1 drop Both Eyes QHS   omega-3 acid ethyl esters  1,000 mg Oral Daily   [START ON 04/26/2021] pantoprazole  40 mg Intravenous Q12H   sodium chloride  2 g Oral BID WC    Continuous Inpatient Infusions:     pantoprazole 8 mg/hr (04/25/21 0636)    PRN Inpatient Medications:  acetaminophen, diazepam, hydrALAZINE, ipratropium, methocarbamol, ondansetron (ZOFRAN) IV, polyethylene glycol  Miscellaneous: N/A  Assessment:  Melena - likely UGI source, though exact source unknown. Possible peptic ulcer disease. Profound, symptomatic anemia secondary to gastrointestinal blood loss. Improved after 3 u prbc transfusion. Chronic anticoagulation - Held (Eliquis). Hemoccult positive stool. Atrial fibrillation, heart rate controlled.  Plan:  Clear liquid diet today. EGD and colonoscopy tomorrow with Dr. Alice Reichert H&H stable with no hemodynamic instability or gross bleeding Please refer to procedure notes for findings, recommendations and patient disposition/instructions. 4. Continue holding anticoagulation as deemed medically feasible.  I reviewed the risks (including bleeding, perforation, infection, anesthesia complications, cardiac/respiratory complications), benefits and alternatives of EGD and colonoscopy. Patient consents to proceed.    Octavia Bruckner, PA-C [facility] [phone] (Cell)

## 2021-04-25 NOTE — H&P (View-Only) (Signed)
[facility] Inpatient Progress Note    Subjective: Patient seen for f/u GI bleed, anemia. Patient appears to have resolved bleeding at this point OFF eliquis. Denies pain, nausea, vomiting, diarrhea, further melena. No family bedside. No new complaints.   Objective: Vital signs in last 24 hours: Temp:  [97.5 F (36.4 C)-98.1 F (36.7 C)] 97.5 F (36.4 C) (01/01 0951) Pulse Rate:  [60-70] 60 (01/01 0951) Resp:  [16-18] 16 (01/01 0951) BP: (145-164)/(70-74) 164/74 (01/01 0951) SpO2:  [89 %-100 %] 89 % (01/01 0951) Blood pressure (!) 164/74, pulse 60, temperature (!) 97.5 F (36.4 C), temperature source Oral, resp. rate 16, height 5\' 4"  (1.626 m), weight 56.7 kg, SpO2 (!) 89 %.   PHYSICAL EXAM:   Gen: NAD. Appears comfortable.  HEENT: [place]/AT. PERRLA. Normal external ear exam. Poor dentition.  Chest: CTA, no wheezes.  CV: RR nl S1, S2. No gallops.  Abd: soft, nt, nd. BS+  Ext: no edema. Pulses 2+  Neuro: Alert and oriented. Judgement appears normal. Nonfocal.   Lab Results: Results for orders placed or performed during the hospital encounter of 04/23/21 (from the past 24 hour(s))  CBC     Status: Abnormal   Collection Time: 04/24/21  1:48 PM  Result Value Ref Range   WBC 10.7 (H) 4.0 - 10.5 K/uL   RBC 4.26 3.87 - 5.11 MIL/uL   Hemoglobin 9.6 (L) 12.0 - 15.0 g/dL   HCT 30.0 (L) 36.0 - 46.0 %   MCV 70.4 (L) 80.0 - 100.0 fL   MCH 22.5 (L) 26.0 - 34.0 pg   MCHC 32.0 30.0 - 36.0 g/dL   RDW 21.8 (H) 11.5 - 15.5 %   Platelets 282 150 - 400 K/uL   nRBC 0.0 0.0 - 0.2 %  CBC     Status: Abnormal   Collection Time: 04/24/21  7:16 PM  Result Value Ref Range   WBC 11.7 (H) 4.0 - 10.5 K/uL   RBC 4.12 3.87 - 5.11 MIL/uL   Hemoglobin 9.5 (L) 12.0 - 15.0 g/dL   HCT 29.2 (L) 36.0 - 46.0 %   MCV 70.9 (L) 80.0 - 100.0 fL   MCH 23.1 (L) 26.0 - 34.0 pg   MCHC 32.5 30.0 - 36.0 g/dL   RDW 22.0 (H) 11.5 - 15.5 %   Platelets 269 150 - 400 K/uL   nRBC 0.0 0.0 -  0.2 %  CBC     Status: Abnormal   Collection Time: 04/25/21  1:23 AM  Result Value Ref Range   WBC 13.0 (H) 4.0 - 10.5 K/uL   RBC 4.29 3.87 - 5.11 MIL/uL   Hemoglobin 9.8 (L) 12.0 - 15.0 g/dL   HCT 30.9 (L) 36.0 - 46.0 %   MCV 72.0 (L) 80.0 - 100.0 fL   MCH 22.8 (L) 26.0 - 34.0 pg   MCHC 31.7 30.0 - 36.0 g/dL   RDW 22.2 (H) 11.5 - 15.5 %   Platelets 286 150 - 400 K/uL   nRBC 0.0 0.0 - 0.2 %  Basic metabolic panel     Status: Abnormal   Collection Time: 04/25/21  5:28 AM  Result Value Ref Range   Sodium 129 (L) 135 - 145 mmol/L   Potassium 3.4 (L) 3.5 - 5.1 mmol/L   Chloride 97 (L) 98 - 111 mmol/L   CO2 23 22 - 32 mmol/L   Glucose, Bld 82 70 - 99 mg/dL   BUN 10 8 - 23 mg/dL   Creatinine, Ser 0.59 0.44 -  1.00 mg/dL   Calcium 8.3 (L) 8.9 - 10.3 mg/dL   GFR, Estimated >60 >60 mL/min   Anion gap 9 5 - 15     Recent Labs    04/24/21 1348 04/24/21 1916 04/25/21 0123  WBC 10.7* 11.7* 13.0*  HGB 9.6* 9.5* 9.8*  HCT 30.0* 29.2* 30.9*  PLT 282 269 286   BMET Recent Labs    04/23/21 2320 04/24/21 0653 04/25/21 0528  NA 128* 129* 129*  K 3.2* 3.6 3.4*  CL 98 95* 97*  CO2 24 25 23   GLUCOSE 69* 86 82  BUN 12 11 10   CREATININE 0.54 0.71 0.59  CALCIUM 8.0* 8.1* 8.3*   LFT Recent Labs    04/23/21 0248  PROT 6.5  ALBUMIN 3.1*  AST 21  ALT 17  ALKPHOS 109  BILITOT 0.7   PT/INR Recent Labs    04/23/21 0248  LABPROT 17.9*  INR 1.5*    Scheduled Inpatient Medications:    atorvastatin  40 mg Oral QPM   cholecalciferol  2,000 Units Oral Daily   docusate sodium  100 mg Oral BID   ferrous sulfate  325 mg Oral BID WC   levothyroxine  112 mcg Oral QAC breakfast   mometasone-formoterol  2 puff Inhalation BID   montelukast  10 mg Oral QHS   olopatadine  1 drop Both Eyes QHS   omega-3 acid ethyl esters  1,000 mg Oral Daily   [START ON 04/26/2021] pantoprazole  40 mg Intravenous Q12H   sodium chloride  2 g Oral BID WC    Continuous Inpatient Infusions:     pantoprazole 8 mg/hr (04/25/21 0636)    PRN Inpatient Medications:  acetaminophen, diazepam, hydrALAZINE, ipratropium, methocarbamol, ondansetron (ZOFRAN) IV, polyethylene glycol  Miscellaneous: N/A  Assessment:  Melena - likely UGI source, though exact source unknown. Possible peptic ulcer disease. Profound, symptomatic anemia secondary to gastrointestinal blood loss. Improved after 3 u prbc transfusion. Chronic anticoagulation - Held (Eliquis). Hemoccult positive stool. Atrial fibrillation, heart rate controlled.  Plan:  Clear liquid diet today. EGD and colonoscopy tomorrow with Dr. Alice Reichert H&H stable with no hemodynamic instability or gross bleeding Please refer to procedure notes for findings, recommendations and patient disposition/instructions. 4. Continue holding anticoagulation as deemed medically feasible.  I reviewed the risks (including bleeding, perforation, infection, anesthesia complications, cardiac/respiratory complications), benefits and alternatives of EGD and colonoscopy. Patient consents to proceed.    Octavia Bruckner, PA-C [facility] [phone] (Cell)

## 2021-04-26 ENCOUNTER — Encounter
Admission: EM | Disposition: A | Discharge status: Home Health | Payer: Self-pay | Source: Home / Self Care | Attending: Internal Medicine

## 2021-04-26 ENCOUNTER — Encounter: Payer: Self-pay | Admitting: Internal Medicine

## 2021-04-26 ENCOUNTER — Inpatient Hospital Stay: Payer: Medicare Other | Admitting: Anesthesiology

## 2021-04-26 DIAGNOSIS — K922 Gastrointestinal hemorrhage, unspecified: Secondary | ICD-10-CM | POA: Diagnosis not present

## 2021-04-26 HISTORY — PX: ESOPHAGOGASTRODUODENOSCOPY: SHX5428

## 2021-04-26 HISTORY — PX: COLONOSCOPY: SHX5424

## 2021-04-26 LAB — ECHOCARDIOGRAM COMPLETE
AR max vel: 1.03 cm2
AV Peak grad: 12.5 mmHg
Ao pk vel: 1.77 m/s
Area-P 1/2: 3.46 cm2
Calc EF: 77.4 %
Height: 64 in
MV VTI: 0.99 cm2
P 1/2 time: 603 msec
S' Lateral: 2.9 cm
Single Plane A2C EF: 78.1 %
Single Plane A4C EF: 76.4 %
Weight: 2000 oz

## 2021-04-26 SURGERY — EGD (ESOPHAGOGASTRODUODENOSCOPY)
Anesthesia: General

## 2021-04-26 MED ORDER — PROPOFOL 500 MG/50ML IV EMUL
INTRAVENOUS | Status: DC | PRN
  Administered 2021-04-26: 100 ug/kg/min via INTRAVENOUS

## 2021-04-26 MED ORDER — SODIUM CHLORIDE 0.9 % IV SOLN
INTRAVENOUS | Status: DC | PRN
Start: 2021-04-26 — End: 2021-04-26

## 2021-04-26 MED ORDER — PROPOFOL 10 MG/ML IV BOLUS
INTRAVENOUS | Status: DC | PRN
  Administered 2021-04-26: 40 mg via INTRAVENOUS
  Administered 2021-04-26: 20 mg via INTRAVENOUS

## 2021-04-26 MED ORDER — EPHEDRINE 5 MG/ML INJ
INTRAVENOUS | Status: AC
  Filled 2021-04-26: qty 5

## 2021-04-26 MED ORDER — POTASSIUM CHLORIDE CRYS ER 20 MEQ PO TBCR
20.0000 meq | EXTENDED_RELEASE_TABLET | Freq: Once | ORAL | Status: AC
  Administered 2021-04-26: 20 meq via ORAL
  Filled 2021-04-26: qty 1

## 2021-04-26 MED ORDER — LIDOCAINE 2% (20 MG/ML) 5 ML SYRINGE
INTRAMUSCULAR | Status: DC | PRN
Start: 2021-04-26 — End: 2021-04-26
  Administered 2021-04-26: 20 mg via INTRAVENOUS

## 2021-04-26 MED ORDER — PANTOPRAZOLE SODIUM 40 MG PO TBEC
40.0000 mg | DELAYED_RELEASE_TABLET | Freq: Every day | ORAL | Status: DC
  Administered 2021-04-26 – 2021-04-27 (×2): 40 mg via ORAL
  Filled 2021-04-26 (×2): qty 1

## 2021-04-26 MED ORDER — BISACODYL 10 MG RE SUPP
10.0000 mg | Freq: Once | RECTAL | Status: AC
  Administered 2021-04-26: 10 mg via RECTAL
  Filled 2021-04-26 (×3): qty 1

## 2021-04-26 MED ORDER — EPHEDRINE SULFATE 50 MG/ML IJ SOLN
INTRAMUSCULAR | Status: DC | PRN
  Administered 2021-04-26: 5 mg via INTRAVENOUS

## 2021-04-26 MED ORDER — POLYETHYLENE GLYCOL 3350 17 GM/SCOOP PO POWD
1.0000 | Freq: Once | ORAL | Status: AC
  Administered 2021-04-26: 255 g via ORAL
  Filled 2021-04-26 (×3): qty 255

## 2021-04-26 NOTE — Interval H&P Note (Signed)
History and Physical Interval Note:  04/26/2021 8:59 AM  [name]  has presented today for surgery, with the diagnosis of Symptomatic anemia, heme positive stool.  The various methods of treatment have been discussed with the patient and family. After consideration of risks, benefits and other options for treatment, the patient has consented to  Procedure(s): ESOPHAGOGASTRODUODENOSCOPY (EGD) (N/A) COLONOSCOPY (N/A) as a surgical intervention.  The patient's history has been reviewed, patient examined, no change in status, stable for surgery.  I have reviewed the patient's chart and labs.  Questions were answered to the patient's satisfaction.     [place], [place]

## 2021-04-26 NOTE — TOC Initial Note (Signed)
Transition of Care [facility]) - Initial/Assessment Note    Patient Details  Name: [name] MRN: [id number] Date of Birth: [birth year]  Transition of Care [facility]) CM/SW Contact:    Beverly Sessions, RN Phone Number: 04/26/2021, 3:51 PM  Clinical Narrative:                 Admitted for: acute GI bleed  Admitted from: home with nephew VPX:TGGYI- nephew transports [facility]acy:[facility] Current home health/prior home health/DME:[facility] in the home  Requested PT eval when medically appropriate.  If home health recommended nephew states he does not have a preference of home health agency    Expected Discharge Plan: [place] Barriers to Discharge: Continued Medical Work up   Patient Goals and CMS Choice        Expected Discharge Plan and Services Expected Discharge Plan: [place]   Discharge Planning Services: CM Consult   Living arrangements for the past 2 months: Single Family Home                                      Prior Living Arrangements/Services Living arrangements for the past 2 months: Single Family Home Lives with:: Relatives Patient language and need for interpreter reviewed:: Yes Do you feel safe going back to the place where you live?: Yes      Need for Family Participation in Patient Care: Yes (Comment) Care giver support system in place?: Yes (comment) Current home services: DME Criminal Activity/Legal Involvement Pertinent to Current Situation/Hospitalization: No - Comment as needed  Activities of Daily Living Home Assistive Devices/Equipment: Cane (specify quad or straight), Walker (specify type) ADL Screening (condition at time of admission) Patient's cognitive ability adequate to safely complete daily activities?: Yes Is the patient deaf or have difficulty hearing?: No Does the patient have difficulty seeing, even when wearing glasses/contacts?: No Does the patient have difficulty concentrating,  remembering, or making decisions?: No Patient able to express need for assistance with ADLs?: Yes Does the patient have difficulty dressing or bathing?: No Independently performs ADLs?: Yes (appropriate for developmental age) Does the patient have difficulty walking or climbing stairs?: No Weakness of Legs: None Weakness of Arms/Hands: None  Permission Sought/Granted                  Emotional Assessment       Orientation: : Oriented to Self, Oriented to Place, Oriented to  Time, Oriented to Situation Alcohol / Substance Use: Not Applicable Psych Involvement: No (comment)  Admission diagnosis:  Shortness of breath [R06.02] Hyponatremia [E87.1] Acute GI bleeding [K92.2] Symptomatic anemia [D64.9] Congestive heart failure, unspecified HF chronicity, unspecified heart failure type ([place]) [I50.9] Patient Active Problem List   Diagnosis Date Noted   Acute GI bleeding 04/23/2021   HTN (hypertension) 04/23/2021   Asthma 04/23/2021   TIA (transient ischemic attack) 04/23/2021   Atrial fibrillation, chronic ([place]) 04/23/2021   Acute blood loss anemia 04/23/2021   Iron deficiency anemia 04/23/2021   Hyponatremia    Symptomatic anemia    Closed left hip fracture ([place]) 09/15/2020   Acute appendicitis 07/25/2019   Leukocytosis 06/29/2016   Hypogammaglobulinemia ([place]) 06/29/2016   Temporary cerebral vascular dysfunction 02/28/2014   DD (diverticular disease) 02/28/2014   Allergic rhinitis 02/28/2014   Allergy status to sulfonamides 02/28/2014   History of anticoagulant therapy 02/28/2014   Arthritis 02/28/2014   Airway hyperreactivity  02/28/2014   A-fib ([place]) 02/28/2014   Bilateral cataracts 02/28/2014   Dermatitis, eczematoid 02/28/2014   Allergy to environmental factors 02/28/2014   Acid reflux 02/28/2014   Polypharmacy 02/28/2014   HLD (hyperlipidemia) 02/28/2014   Adult hypothyroidism 02/28/2014   BP (high blood pressure) 02/28/2014   Arthritis, degenerative 02/28/2014    Degenerative arthritis of hip 10/09/2013   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) 06/01/2012   Lumbar radiculopathy 06/01/2012   DDD (degenerative disc disease), lumbar 06/01/2012   H/O cardiac catheterization 10/20/2009   PCP:  Ezequiel Kayser, MD (Inactive) Pharmacy:   [facility] [place], [place] - [place] [facility] [address] [place] [facility] [place] [address] Phone: [phone] Fax: [phone]     Social Determinants of Health ([place]) Interventions    Readmission Risk Interventions Readmission Risk Prevention Plan 04/26/2021  Transportation Screening Complete  HRI or Home Care Consult Complete  Social Work Consult for [place] Planning/Counseling Complete  Palliative Care Screening Not Applicable  Medication Review Press photographer) Complete  Some recent data might be hidden

## 2021-04-26 NOTE — Progress Notes (Signed)
[place] [facility] [place] NAME: Merranda Bolls    MR#:  [id number]  DATE OF BIRTH:  1924/06/08  SUBJECTIVE:  patient came back from G.I. evaluation. Unable to do colonoscopy due to poor prep.   REVIEW OF SYSTEMS:   Review of Systems  Constitutional:  Negative for chills, fever and weight loss.  HENT:  Negative for ear discharge, ear pain and nosebleeds.   Eyes:  Negative for blurred vision, pain and discharge.  Respiratory:  Negative for sputum production, shortness of breath, wheezing and stridor.   Cardiovascular:  Negative for chest pain, palpitations, orthopnea and PND.  Gastrointestinal:  Negative for abdominal pain, diarrhea, nausea and vomiting.  Genitourinary:  Negative for frequency and urgency.  Musculoskeletal:  Negative for back pain and joint pain.  Neurological:  Positive for weakness. Negative for sensory change, speech change and focal weakness.  Psychiatric/Behavioral:  Negative for depression and hallucinations. The patient is not nervous/anxious.   Tolerating Diet:yes Tolerating PT:   DRUG ALLERGIES:   Allergies  Allergen Reactions   Fluticasone-Salmeterol Anaphylaxis   Albuterol     Other reaction(s): Other (See Comments) It burns and makes her cough "too strong for me or something"   Ciprofloxacin Other (See Comments) and Hives    Other reaction(s): Unknown Does not remember   Codeine Hives   Colchicine Diarrhea   Other Other (See Comments)   Promethazine Other (See Comments)    Unable to speak. Loss muscle control ([place])   Valacyclovir     Other reaction(s): Muscle Pain Could hardly move   Guaifenesin Palpitations   Sulfa Antibiotics Rash and Hives    VITALS:  Blood pressure (!) 126/57, pulse (!) 59, temperature (!) 97.4 F (36.3 C), temperature source Oral, resp. rate 16, height 5\' 4"  (1.626 m), weight 56.7 kg, SpO2 99 %.  PHYSICAL EXAMINATION:   Physical Exam  GENERAL:  88 y.o.-year-old patient  lying in the bed with no acute distress.  HEENT: Head atraumatic, normocephalic. Oropharynx and nasopharynx clear.  LUNGS: Normal breath sounds bilaterally, no wheezing, rales, rhonchi. No use of accessory muscles of respiration.  CARDIOVASCULAR: S1, S2 normal. No murmurs, rubs, or gallops.  ABDOMEN: Soft, nontender, nondistended. Bowel sounds present. No organomegaly or mass.  EXTREMITIES: No cyanosis, clubbing or edema b/l.    NEUROLOGIC: nonfocal PSYCHIATRIC:  patient is alert and oriented x 3.  SKIN: No obvious rash, lesion, or ulcer.   LABORATORY PANEL:  CBC Recent Labs  Lab 04/25/21 0123  WBC 13.0*  HGB 9.8*  HCT 30.9*  PLT 286     Chemistries  Recent Labs  Lab 04/23/21 0248 04/23/21 0838 04/25/21 0528  NA 123*   < > 129*  K 3.6   < > 3.4*  CL 93*   < > 97*  CO2 25   < > 23  GLUCOSE 95   < > 82  BUN 18   < > 10  CREATININE 0.58   < > 0.59  CALCIUM 8.2*   < > 8.3*  AST 21  --   --   ALT 17  --   --   ALKPHOS 109  --   --   BILITOT 0.7  --   --    < > = values in this interval not displayed.    Cardiac Enzymes No results for input(s): TROPONINI in the last 168 hours. RADIOLOGY:  ECHOCARDIOGRAM COMPLETE  Result Date: 04/26/2021    ECHOCARDIOGRAM REPORT  Patient Name:   TEAGAN OZAWA Sprong Date of Exam: 04/25/2021 Medical Rec #:  [id number]            Height:       64.0 in Accession #:    [id number]           Weight:       125.0 lb Date of Birth:  10/11/1924           BSA:          1.602 m Patient Age:    79 years             BP:           164/74 mmHg Patient Gender: F                    HR:           60 bpm. Exam Location:  [facility] Procedure: 2D Echo Indications:     HF I50.31  History:         Patient has no prior history of Echocardiogram examinations.  Sonographer:     Kathlen Brunswick RDCS Referring Phys:  Unknown Foley NIU Diagnosing Phys: Donnelly Angelica IMPRESSIONS  1. Left ventricular ejection fraction, by estimation, is 60 to 65%. The left ventricle has normal  function. The left ventricle has no regional wall motion abnormalities. Left ventricular diastolic parameters are consistent with Grade I diastolic dysfunction (impaired relaxation).  2. Right ventricular systolic function is normal. The right ventricular size is normal.  3. Left atrial size was severely dilated.  4. The mitral valve is normal in structure. Mild mitral valve regurgitation. No evidence of mitral stenosis.  5. The aortic valve is normal in structure. Aortic valve regurgitation is mild. Aortic valve sclerosis is present, with no evidence of aortic valve stenosis. FINDINGS  Left Ventricle: Left ventricular ejection fraction, by estimation, is 60 to 65%. The left ventricle has normal function. The left ventricle has no regional wall motion abnormalities. The left ventricular internal cavity size was normal in size. There is  no left ventricular hypertrophy. Left ventricular diastolic parameters are consistent with Grade I diastolic dysfunction (impaired relaxation). Right Ventricle: The right ventricular size is normal. No increase in right ventricular wall thickness. Right ventricular systolic function is normal. Left Atrium: Left atrial size was severely dilated. Right Atrium: Right atrial size was normal in size. Pericardium: There is no evidence of pericardial effusion. Mitral Valve: The mitral valve is normal in structure. Mild mitral valve regurgitation. No evidence of mitral valve stenosis. MV peak gradient, 8.3 mmHg. The mean mitral valve gradient is 3.0 mmHg. Tricuspid Valve: The tricuspid valve is normal in structure. Tricuspid valve regurgitation is mild. Aortic Valve: The aortic valve is normal in structure. Aortic valve regurgitation is mild. Aortic regurgitation PHT measures 603 msec. Aortic valve sclerosis is present, with no evidence of aortic valve stenosis. Aortic valve peak gradient measures 12.5 mmHg. Pulmonic Valve: The pulmonic valve was not well visualized. Pulmonic valve  regurgitation is not visualized. No evidence of pulmonic stenosis. Aorta: The aortic root and ascending aorta are structurally normal, with no evidence of dilitation. IAS/Shunts: No atrial level shunt detected by color flow Doppler.  LEFT VENTRICLE PLAX 2D LVIDd:         4.20 cm     Diastology LVIDs:         2.90 cm     LV e' medial:    8.05 cm/s LV PW:  1.10 cm     LV E/e' medial:  13.2 LV IVS:        1.00 cm     LV e' lateral:   6.09 cm/s LVOT diam:     1.80 cm     LV E/e' lateral: 17.4 LV SV:         39 LV SV Index:   25 LVOT Area:     2.54 cm  LV Volumes (MOD) LV vol d, MOD A2C: 38.3 ml LV vol d, MOD A4C: 41.6 ml LV vol s, MOD A2C: 8.4 ml LV vol s, MOD A4C: 9.8 ml LV SV MOD A2C:     29.9 ml LV SV MOD A4C:     41.6 ml LV SV MOD BP:      31.6 ml RIGHT VENTRICLE RV Basal diam:  2.40 cm RV S prime:     10.90 cm/s TAPSE (M-mode): 1.6 cm LEFT ATRIUM             Index        RIGHT ATRIUM           Index LA diam:        4.10 cm 2.56 cm/m   RA Area:     16.50 cm LA Vol (A2C):   84.0 ml 52.43 ml/m  RA Volume:   35.60 ml  22.22 ml/m LA Vol (A4C):   76.2 ml 47.56 ml/m LA Biplane Vol: 87.3 ml 54.49 ml/m  AORTIC VALVE                 PULMONIC VALVE AV Area (Vmax): 1.03 cm     PV Vmax:       0.92 m/s AV Vmax:        177.00 cm/s  PV Peak grad:  3.4 mmHg AV Peak Grad:   12.5 mmHg LVOT Vmax:      71.80 cm/s LVOT Vmean:     49.900 cm/s LVOT VTI:       0.155 m AI PHT:         603 msec  AORTA Ao Root diam: 3.10 cm Ao Asc diam:  3.40 cm MITRAL VALVE                TRICUSPID VALVE MV Area (PHT): 3.46 cm     TV Peak grad:   17.8 mmHg MV Area VTI:   0.99 cm     TV Vmax:        2.11 m/s MV Peak grad:  8.3 mmHg MV Mean grad:  3.0 mmHg     SHUNTS MV Vmax:       1.44 m/s     Systemic VTI:  0.16 m MV Vmean:      70.6 cm/s    Systemic Diam: 1.80 cm MV Decel Time: 219 msec MV E velocity: 106.00 cm/s MV A velocity: 48.40 cm/s MV E/A ratio:  2.19 Donnelly Angelica Electronically signed by Donnelly Angelica Signature Date/Time:  04/26/2021/12:32:09 PM    Final    ASSESSMENT AND PLAN:  Rylie Limburg is a 88 y.o. female with medical history significant of atrial fibrillation on Eliquis, hypertension, hyperlipidemia, asthma, TIA, GERD, hypothyroidism, sinoatrial node dysfunction (s/p of AICD placement), s/p biliary T-tube placement, who presented to the hospital with melena and shortness of breath.  Melena/G.I. bleed -- came in with hemoglobin of 5.4 status post three unit blood transfusion -- hemoglobin 9.8 -- denies any further bleeding or bright red blood -- seen by G.I. Dr. Alice Reichert plans  for EGD colonoscopy tomorrow -- hold eliquis -- continue IV Protonix -- colonoscopy poor prep to be repeated tomorrow -- EGD preliminary report per Dr. Alice Reichert negative  history of TIA and a fib -- rate controlled -- eliquis on hold  Hypothyroidism -- continue Synthroid  Hyponatremia -- patient came in with sodium of 123---129 --cont salt tabs for couple days --mentation stable   Procedures:EGD/colonscopy Family communication : none Consults : G.I. CODE STATUS: full DVT Prophylaxis : SCD Level of care: Telemetry Medical Status is: Inpatient  Remains inpatient appropriate because: anemia workup        TOTAL TIME TAKING CARE OF THIS PATIENT: 25 minutes.  >50% time spent on counselling and coordination of care  Note: This dictation was prepared with Dragon dictation along with smaller phrase technology. Any transcriptional errors that result from this process are unintentional.  Fritzi Mandes M.D    [facility]   CC: Primary care physician; Ezequiel Kayser, MD (Inactive) Patient ID: Sarabelle Genson, female   DOB: 11-21-1924, 88 y.o.   MRN: [id number]

## 2021-04-26 NOTE — OR Nursing (Signed)
Inadequate prep, aborted procedure.

## 2021-04-26 NOTE — Care Management Important Message (Signed)
Important Message  Patient Details  Name: [name] MRN: [id number] Date of Birth: December 04, 1924   Medicare Important Message Given:  Yes     Dannette Barbara 04/26/2021, 10:44 AM

## 2021-04-26 NOTE — Anesthesia Postprocedure Evaluation (Signed)
Anesthesia Post Note  Patient: [name]  Procedure(s) Performed: ESOPHAGOGASTRODUODENOSCOPY (EGD) COLONOSCOPY  Patient location during evaluation: PACU Anesthesia Type: General Level of consciousness: awake and alert Pain management: pain level controlled Vital Signs Assessment: post-procedure vital signs reviewed and stable Respiratory status: spontaneous breathing, nonlabored ventilation, respiratory function stable and patient connected to nasal cannula oxygen Cardiovascular status: blood pressure returned to baseline and stable Postop Assessment: no apparent nausea or vomiting Anesthetic complications: no   No notable events documented.   Last Vitals:  Vitals:   04/26/21 0939 04/26/21 0949  BP: (!) 99/41 (!) 108/44  Pulse: (!) 59 (!) 59  Resp: 20 14  Temp:    SpO2: 90% 94%    Last Pain:  Vitals:   04/26/21 0949  TempSrc:   PainSc: 0-No pain                 Arita Miss

## 2021-04-26 NOTE — Anesthesia Preprocedure Evaluation (Signed)
Anesthesia Evaluation  Patient identified by MRN, date of birth, ID band Patient awake    Reviewed: Allergy & Precautions, NPO status , Patient's Chart, lab work & pertinent test results  History of Anesthesia Complications Negative for: history of anesthetic complications  Airway Mallampati: III  TM Distance: >3 FB Neck ROM: Full    Dental  (+) Partial Upper, Poor Dentition, Missing, Chipped   Pulmonary asthma , neg sleep apnea, COPD,  COPD inhaler, Patient abstained from smoking.Not current smoker,    Pulmonary exam normal breath sounds clear to auscultation       Cardiovascular Exercise Tolerance: Good METShypertension, Pt. on medications (-) CAD, (-) Past MI and (-) CHF + dysrhythmias Atrial Fibrillation + pacemaker + Valvular Problems/Murmurs MR  Rhythm:Regular Rate:Normal - Systolic murmurs    Neuro/Psych TIA Transient Visual Symptoms TIA (Pt unsure)negative psych ROS   GI/Hepatic Neg liver ROS, GERD  Medicated,  Endo/Other  neg diabetesHypothyroidism   Renal/GU negative Renal ROS  negative genitourinary   Musculoskeletal  (+) Arthritis , Osteoarthritis,    Abdominal   Peds negative pediatric ROS (+)  Hematology  (+) Blood dyscrasia, anemia , Eliquis last dose 09/15/20   Anesthesia Other Findings Past Medical History: No date: Arthritis No date: Asthma No date: Atrial fibrillation (HCC) No date: Cataract No date: Eczema No date: GERD (gastroesophageal reflux disease) No date: Gout of wrist No date: Heart murmur No date: Pacemaker No date: Pseudogout of ankle or foot No date: Thyroid disease No date: TIA (transient ischemic attack)  Reproductive/Obstetrics negative OB ROS                                                              Anesthesia Evaluation  Patient identified by MRN, date of birth, ID band Patient awake    Reviewed: Allergy & Precautions, NPO status ,  Patient's Chart, lab work & pertinent test results  History of Anesthesia Complications Negative for: history of anesthetic complications  Airway Mallampati: II  TM Distance: >3 FB Neck ROM: Full    Dental  (+) Lower Dentures   Pulmonary asthma ,    breath sounds clear to auscultation- rhonchi (-) wheezing      Cardiovascular hypertension, (-) CAD, (-) Past MI, (-) Cardiac Stents and (-) CABG + dysrhythmias Atrial Fibrillation + pacemaker  Rhythm:Regular Rate:Normal - Systolic murmurs and - Diastolic murmurs    Neuro/Psych TIAnegative psych ROS   GI/Hepatic Neg liver ROS, GERD  ,  Endo/Other  neg diabetesHypothyroidism   Renal/GU negative Renal ROS     Musculoskeletal  (+) Arthritis ,   Abdominal (+) - obese,   Peds  Hematology negative hematology ROS (+)   Anesthesia Other Findings Past Medical History: No date: Arthritis No date: Asthma No date: Atrial fibrillation (HCC) No date: Cataract No date: Eczema No date: GERD (gastroesophageal reflux disease) No date: Gout of wrist No date: Heart murmur No date: Pacemaker No date: Pseudogout of ankle or foot No date: Thyroid disease No date: TIA (transient ischemic attack)   Reproductive/Obstetrics                             Anesthesia Physical Anesthesia Plan  ASA: III  Anesthesia Plan: General   Post-op Pain Management:  Induction: Intravenous  PONV Risk Score and Plan: 2 and Propofol infusion  Airway Management Planned: Natural Airway  Additional Equipment:   Intra-op Plan:   Post-operative Plan:   Informed Consent: I have reviewed the patients History and Physical, chart, labs and discussed the procedure including the risks, benefits and alternatives for the proposed anesthesia with the patient or authorized representative who has indicated his/her understanding and acceptance.     Dental advisory given  Plan Discussed with: CRNA and  Anesthesiologist  Anesthesia Plan Comments:         Anesthesia Quick Evaluation                                   Anesthesia Evaluation  Patient identified by MRN, date of birth, ID band Patient awake    Reviewed: Allergy & Precautions, NPO status , Patient's Chart, lab work & pertinent test results  History of Anesthesia Complications Negative for: history of anesthetic complications  Airway Mallampati: II  TM Distance: >3 FB Neck ROM: Full    Dental  (+) Lower Dentures   Pulmonary asthma ,    breath sounds clear to auscultation- rhonchi (-) wheezing      Cardiovascular hypertension, (-) CAD, (-) Past MI, (-) Cardiac Stents and (-) CABG + dysrhythmias Atrial Fibrillation + pacemaker  Rhythm:Regular Rate:Normal - Systolic murmurs and - Diastolic murmurs    Neuro/Psych TIAnegative psych ROS   GI/Hepatic Neg liver ROS, GERD  ,  Endo/Other  neg diabetesHypothyroidism   Renal/GU negative Renal ROS     Musculoskeletal  (+) Arthritis ,   Abdominal (+) - obese,   Peds  Hematology negative hematology ROS (+)   Anesthesia Other Findings Past Medical History: No date: Arthritis No date: Asthma No date: Atrial fibrillation (HCC) No date: Cataract No date: Eczema No date: GERD (gastroesophageal reflux disease) No date: Gout of wrist No date: Heart murmur No date: Pacemaker No date: Pseudogout of ankle or foot No date: Thyroid disease No date: TIA (transient ischemic attack)   Reproductive/Obstetrics                             Anesthesia Physical Anesthesia Plan  ASA: III  Anesthesia Plan: General   Post-op Pain Management:    Induction: Intravenous  PONV Risk Score and Plan: 2 and Propofol infusion  Airway Management Planned: Natural Airway  Additional Equipment:   Intra-op Plan:   Post-operative Plan:   Informed Consent: I have reviewed the patients History and Physical, chart, labs and discussed  the procedure including the risks, benefits and alternatives for the proposed anesthesia with the patient or authorized representative who has indicated his/her understanding and acceptance.     Dental advisory given  Plan Discussed with: CRNA and Anesthesiologist  Anesthesia Plan Comments:         Anesthesia Quick Evaluation                                   Anesthesia Evaluation  Patient identified by MRN, date of birth, ID band Patient awake    Reviewed: Allergy & Precautions, NPO status , Patient's Chart, lab work & pertinent test results  History of Anesthesia Complications Negative for: history of anesthetic complications  Airway Mallampati: II  TM Distance: >3 FB Neck ROM: Full  Dental  (+) Lower Dentures   Pulmonary asthma ,    breath sounds clear to auscultation- rhonchi (-) wheezing      Cardiovascular hypertension, (-) CAD, (-) Past MI, (-) Cardiac Stents and (-) CABG + dysrhythmias Atrial Fibrillation + pacemaker  Rhythm:Regular Rate:Normal - Systolic murmurs and - Diastolic murmurs    Neuro/Psych TIAnegative psych ROS   GI/Hepatic Neg liver ROS, GERD  ,  Endo/Other  neg diabetesHypothyroidism   Renal/GU negative Renal ROS     Musculoskeletal  (+) Arthritis ,   Abdominal (+) - obese,   Peds  Hematology negative hematology ROS (+)   Anesthesia Other Findings Past Medical History: No date: Arthritis No date: Asthma No date: Atrial fibrillation (HCC) No date: Cataract No date: Eczema No date: GERD (gastroesophageal reflux disease) No date: Gout of wrist No date: Heart murmur No date: Pacemaker No date: Pseudogout of ankle or foot No date: Thyroid disease No date: TIA (transient ischemic attack)   Reproductive/Obstetrics                             Anesthesia Physical Anesthesia Plan  ASA: III  Anesthesia Plan: General   Post-op Pain Management:    Induction: Intravenous  PONV Risk  Score and Plan: 2 and Propofol infusion  Airway Management Planned: Natural Airway  Additional Equipment:   Intra-op Plan:   Post-operative Plan:   Informed Consent: I have reviewed the patients History and Physical, chart, labs and discussed the procedure including the risks, benefits and alternatives for the proposed anesthesia with the patient or authorized representative who has indicated his/her understanding and acceptance.     Dental advisory given  Plan Discussed with: CRNA and Anesthesiologist  Anesthesia Plan Comments:         Anesthesia Quick Evaluation  Anesthesia Physical  Anesthesia Plan  ASA: 3  Anesthesia Plan: General   Post-op Pain Management: Minimal or no pain anticipated   Induction: Intravenous  PONV Risk Score and Plan: 3 and Propofol infusion, TIVA and Ondansetron  Airway Management Planned: Natural Airway  Additional Equipment: None  Intra-op Plan:   Post-operative Plan:   Informed Consent: I have reviewed the patients History and Physical, chart, labs and discussed the procedure including the risks, benefits and alternatives for the proposed anesthesia with the patient or authorized representative who has indicated his/her understanding and acceptance.     Dental advisory given  Plan Discussed with: CRNA and Surgeon  Anesthesia Plan Comments: (Discussed risks of anesthesia with patient, including possibility of difficulty with spontaneous ventilation under anesthesia necessitating airway intervention, PONV, post operative cognitive dysfunction, and rare risks such as cardiac or respiratory or neurological events, and allergic reactions. Discussed the role of CRNA in patient's perioperative care. Patient understands.)        Anesthesia Quick Evaluation

## 2021-04-26 NOTE — Op Note (Signed)
[facility] Patient Name: [name] Procedure Date: 04/26/2021 8:53 AM MRN: [id number] Account #: [id number] Date of Birth: 1924/08/25 Admit Type: Inpatient Age: 88 Room: [facility] ENDO ROOM 4 Gender: Female Note Status: Finalized Instrument Name: Jasper Riling [id number] Procedure:             Colonoscopy Indications:           Melena, Acute post hemorrhagic anemia Providers:             Benay Pike. Alice Reichert MD, MD Referring MD:          Christena Flake. Raechel Ache, MD (Referring MD) Medicines:             Propofol per Anesthesia Complications:         No immediate complications. Estimated blood loss: None. Procedure:             Pre-Anesthesia Assessment:                        - The risks and benefits of the procedure and the                         sedation options and risks were discussed with the                         patient. All questions were answered and informed                         consent was obtained.                        - Patient identification and proposed procedure were                         verified prior to the procedure by the nurse. The                         procedure was verified in the procedure room.                        - ASA Grade Assessment: III - A patient with severe                         systemic disease.                        - After reviewing the risks and benefits, the patient                         was deemed in satisfactory condition to undergo the                         procedure.                        After obtaining informed consent, the colonoscope was                         passed under direct vision. Throughout the procedure,  the patient's blood pressure, pulse, and oxygen                         saturations were monitored continuously. The procedure                         was aborted. The colonscope was not inserted.                         Medications were given. The colonoscopy  was aborted                         due to large amount of stool in rectal vault on                         digital rectal examination. Findings:      The perianal examination was normal.      The digital rectal exam findings include stool in rectum palpated with       gloved finger.      In light of this finding, Scope was NOT inserted and the procedure was       aborted. Impression:            - The procedure was aborted due to large amount of                         stool in rectal vault on digital rectal examination.                        - Stool in rectum palpated with gloved finger. found                         on digital rectal exam.                        - No specimens collected. Recommendation:        - Return patient to hospital ward prior to repeat                         procedure.                        - Repeat colonoscopy in 1 day.                        - Enemas, suppositories, clear liquid diet. Repeat                         bowel prep. Diagnosis Code(s):     --- Professional ---                        D62, Acute posthemorrhagic anemia                        K92.1, Melena (includes Hematochezia) Efrain Sella MD, MD 04/26/2021 9:22:34 AM This report has been signed electronically. Number of Addenda: 0 Note Initiated On: 04/26/2021 8:53 AM Estimated Blood Loss:  Estimated blood loss: none.      [facility]

## 2021-04-26 NOTE — Transfer of Care (Signed)
Immediate Anesthesia Transfer of Care Note  Patient: [name]  Procedure(s) Performed: ESOPHAGOGASTRODUODENOSCOPY (EGD) COLONOSCOPY  Patient Location: Endoscopy Unit  Anesthesia Type:General  Level of Consciousness: drowsy  Airway & Oxygen Therapy: Patient Spontanous Breathing and Patient connected to nasal cannula oxygen  Post-op Assessment: Report given to RN and Post -op Vital signs reviewed and stable  Post vital signs: Reviewed  Last Vitals:  Vitals Value Taken Time  BP 100/53 04/26/21 0919  Temp    Pulse 61 04/26/21 0919  Resp 17 04/26/21 0919  SpO2 97 % 04/26/21 0919  Vitals shown include unvalidated device data.  Last Pain:  Vitals:   04/26/21 0751  TempSrc: Oral  PainSc:       Patients Stated Pain Goal: 0 (99/14/44 5848)  Complications: No notable events documented.

## 2021-04-27 ENCOUNTER — Encounter: Payer: Self-pay | Admitting: Internal Medicine

## 2021-04-27 ENCOUNTER — Inpatient Hospital Stay: Payer: Medicare Other | Admitting: Anesthesiology

## 2021-04-27 ENCOUNTER — Encounter
Admission: EM | Disposition: A | Discharge status: Home Health | Payer: Self-pay | Source: Home / Self Care | Attending: Internal Medicine

## 2021-04-27 ENCOUNTER — Inpatient Hospital Stay: Payer: Medicare Other

## 2021-04-27 DIAGNOSIS — K922 Gastrointestinal hemorrhage, unspecified: Secondary | ICD-10-CM | POA: Diagnosis not present

## 2021-04-27 HISTORY — PX: COLONOSCOPY: SHX5424

## 2021-04-27 SURGERY — COLONOSCOPY
Anesthesia: General

## 2021-04-27 MED ORDER — PROPOFOL 500 MG/50ML IV EMUL
INTRAVENOUS | Status: DC | PRN
  Administered 2021-04-27: 50 ug/kg/min via INTRAVENOUS

## 2021-04-27 MED ORDER — FERROUS SULFATE 325 (65 FE) MG PO TABS: 325.0000 mg | ORAL_TABLET | Freq: Two times a day (BID) | ORAL | 3 refills | Status: DC

## 2021-04-27 MED ORDER — ONDANSETRON HCL 4 MG/2ML IJ SOLN: 4.0000 mg | Freq: Once | INTRAMUSCULAR | Status: DC | PRN

## 2021-04-27 MED ORDER — PROPOFOL 10 MG/ML IV BOLUS
INTRAVENOUS | Status: DC | PRN
  Administered 2021-04-27: 50 mg via INTRAVENOUS
  Administered 2021-04-27: 20 mg via INTRAVENOUS

## 2021-04-27 MED ORDER — SODIUM CHLORIDE 0.9 % IV SOLN
INTRAVENOUS | Status: DC | PRN
Start: 2021-04-27 — End: 2021-04-27

## 2021-04-27 NOTE — Progress Notes (Signed)
[name] to be D/C'd Home per MD order.  Discussed with the patient and all questions fully answered.  VSS, Skin clean, dry and intact without evidence of skin break down, no evidence of skin tears noted. IV catheter discontinued intact. Site without signs and symptoms of complications. Dressing and pressure applied.  An After Visit Summary was printed and given to the patient.  D/c education completed with patient/family including follow up instructions, medication list, d/c activities limitations if indicated, with other d/c instructions as indicated by MD - patient able to verbalize understanding, all questions fully answered.   Patient instructed to return to ED, call 911, or call MD for any changes in condition.   Patient escorted via [place], and D/C home via private auto.  Manuella Ghazi 04/27/2021 5:08 PM

## 2021-04-27 NOTE — Discharge Instructions (Addendum)
Pt to hold eliquis till further out pt labs ensure hgb is stable. Discuss with your PCP about it

## 2021-04-27 NOTE — Progress Notes (Signed)
[facility] NAME: Neomia Herbel    MR#:  [id number]  DATE OF BIRTH:  [birth year]  SUBJECTIVE:  patient underwent prep for colonoscopy again. Scheduled for this afternoon.  REVIEW OF SYSTEMS:   Review of Systems  Constitutional:  Negative for chills, fever and weight loss.  HENT:  Negative for ear discharge, ear pain and nosebleeds.   Eyes:  Negative for blurred vision, pain and discharge.  Respiratory:  Negative for sputum production, shortness of breath, wheezing and stridor.   Cardiovascular:  Negative for chest pain, palpitations, orthopnea and PND.  Gastrointestinal:  Negative for abdominal pain, diarrhea, nausea and vomiting.  Genitourinary:  Negative for frequency and urgency.  Musculoskeletal:  Negative for back pain and joint pain.  Neurological:  Positive for weakness. Negative for sensory change, speech change and focal weakness.  Psychiatric/Behavioral:  Negative for depression and hallucinations. The patient is not nervous/anxious.   Tolerating Diet:yes Tolerating PT: pending  DRUG ALLERGIES:   Allergies  Allergen Reactions   Fluticasone-Salmeterol Anaphylaxis   Albuterol     Other reaction(s): Other (See Comments) It burns and makes her cough "too strong for me or something"   Ciprofloxacin Other (See Comments) and Hives    Other reaction(s): Unknown Does not remember   Codeine Hives   Colchicine Diarrhea   Other Other (See Comments)   Promethazine Other (See Comments)    Unable to speak. Loss muscle control (PHENGERAN)   Valacyclovir     Other reaction(s): Muscle Pain Could hardly move   Guaifenesin Palpitations   Sulfa Antibiotics Rash and Hives    VITALS:  Blood pressure (!) 126/59, pulse 62, temperature 97.6 F (36.4 C), temperature source Oral, resp. rate 17, height 5\' 4"  (1.626 m), weight 64.2 kg, SpO2 100 %.  PHYSICAL EXAMINATION:   Physical Exam  GENERAL:  88 y.o.-year-old patient lying in  the bed with no acute distress.  HEENT: Head atraumatic, normocephalic. Oropharynx and nasopharynx clear.  LUNGS: Normal breath sounds bilaterally, no wheezing, rales, rhonchi. No use of accessory muscles of respiration.  CARDIOVASCULAR: S1, S2 normal. No murmurs, rubs, or gallops.  ABDOMEN: Soft, nontender, nondistended. Bowel sounds present. No organomegaly or mass.  EXTREMITIES: No cyanosis, clubbing or edema b/l.    NEUROLOGIC: nonfocal PSYCHIATRIC:  patient is alert and oriented x 3.  SKIN: No obvious rash, lesion, or ulcer.   LABORATORY PANEL:  CBC Recent Labs  Lab 04/25/21 0123  WBC 13.0*  HGB 9.8*  HCT 30.9*  PLT 286     Chemistries  Recent Labs  Lab 04/23/21 0248 04/23/21 0838 04/25/21 0528  NA 123*   < > 129*  K 3.6   < > 3.4*  CL 93*   < > 97*  CO2 25   < > 23  GLUCOSE 95   < > 82  BUN 18   < > 10  CREATININE 0.58   < > 0.59  CALCIUM 8.2*   < > 8.3*  AST 21  --   --   ALT 17  --   --   ALKPHOS 109  --   --   BILITOT 0.7  --   --    < > = values in this interval not displayed.    Cardiac Enzymes No results for input(s): TROPONINI in the last 168 hours. RADIOLOGY:  DG Abd Portable 1V  Result Date: 04/27/2021 CLINICAL DATA:  Constipation.  Scheduled for colonoscopy today. EXAM: PORTABLE ABDOMEN -  1 VIEW COMPARISON:  CT 07/25/2019 FINDINGS: There is gas within small and large bowel but no abnormally dilated loops. I do not identify any definite residual fecal matter. IMPRESSION: Gas in small and large bowel but no abnormally dilated loops. No definite residual fecal matter by radiography. Electronically Signed   By: Nelson Chimes M.D.   On: 04/27/2021 11:13   ECHOCARDIOGRAM COMPLETE  Result Date: 04/26/2021    ECHOCARDIOGRAM REPORT   Patient Name:   Jezel HARVEY Monforte Date of Exam: 04/25/2021 Medical Rec #:  [id number]            Height:       64.0 in Accession #:    [id number]           Weight:       125.0 lb Date of Birth:  [birth year]           BSA:           1.602 m Patient Age:    88 years             BP:           164/74 mmHg Patient Gender: F                    HR:           60 bpm. Exam Location:  [facility] Procedure: 2D Echo Indications:     HF I50.31  History:         Patient has no prior history of Echocardiogram examinations.  Sonographer:     Kathlen Brunswick RDCS Referring Phys:  Unknown Foley NIU Diagnosing Phys: Donnelly Angelica IMPRESSIONS  1. Left ventricular ejection fraction, by estimation, is 60 to 65%. The left ventricle has normal function. The left ventricle has no regional wall motion abnormalities. Left ventricular diastolic parameters are consistent with Grade I diastolic dysfunction (impaired relaxation).  2. Right ventricular systolic function is normal. The right ventricular size is normal.  3. Left atrial size was severely dilated.  4. The mitral valve is normal in structure. Mild mitral valve regurgitation. No evidence of mitral stenosis.  5. The aortic valve is normal in structure. Aortic valve regurgitation is mild. Aortic valve sclerosis is present, with no evidence of aortic valve stenosis. FINDINGS  Left Ventricle: Left ventricular ejection fraction, by estimation, is 60 to 65%. The left ventricle has normal function. The left ventricle has no regional wall motion abnormalities. The left ventricular internal cavity size was normal in size. There is  no left ventricular hypertrophy. Left ventricular diastolic parameters are consistent with Grade I diastolic dysfunction (impaired relaxation). Right Ventricle: The right ventricular size is normal. No increase in right ventricular wall thickness. Right ventricular systolic function is normal. Left Atrium: Left atrial size was severely dilated. Right Atrium: Right atrial size was normal in size. Pericardium: There is no evidence of pericardial effusion. Mitral Valve: The mitral valve is normal in structure. Mild mitral valve regurgitation. No evidence of mitral valve stenosis. MV peak gradient, 8.3 mmHg.  The mean mitral valve gradient is 3.0 mmHg. Tricuspid Valve: The tricuspid valve is normal in structure. Tricuspid valve regurgitation is mild. Aortic Valve: The aortic valve is normal in structure. Aortic valve regurgitation is mild. Aortic regurgitation PHT measures 603 msec. Aortic valve sclerosis is present, with no evidence of aortic valve stenosis. Aortic valve peak gradient measures 12.5 mmHg. Pulmonic Valve: The pulmonic valve was not well visualized. Pulmonic valve regurgitation is not visualized. No evidence of  pulmonic stenosis. Aorta: The aortic root and ascending aorta are structurally normal, with no evidence of dilitation. IAS/Shunts: No atrial level shunt detected by color flow Doppler.  LEFT VENTRICLE PLAX 2D LVIDd:         4.20 cm     Diastology LVIDs:         2.90 cm     LV e' medial:    8.05 cm/s LV PW:         1.10 cm     LV E/e' medial:  13.2 LV IVS:        1.00 cm     LV e' lateral:   6.09 cm/s LVOT diam:     1.80 cm     LV E/e' lateral: 17.4 LV SV:         39 LV SV Index:   25 LVOT Area:     2.54 cm  LV Volumes (MOD) LV vol d, MOD A2C: 38.3 ml LV vol d, MOD A4C: 41.6 ml LV vol s, MOD A2C: 8.4 ml LV vol s, MOD A4C: 9.8 ml LV SV MOD A2C:     29.9 ml LV SV MOD A4C:     41.6 ml LV SV MOD BP:      31.6 ml RIGHT VENTRICLE RV Basal diam:  2.40 cm RV S prime:     10.90 cm/s TAPSE (M-mode): 1.6 cm LEFT ATRIUM             Index        RIGHT ATRIUM           Index LA diam:        4.10 cm 2.56 cm/m   RA Area:     16.50 cm LA Vol (A2C):   84.0 ml 52.43 ml/m  RA Volume:   35.60 ml  22.22 ml/m LA Vol (A4C):   76.2 ml 47.56 ml/m LA Biplane Vol: 87.3 ml 54.49 ml/m  AORTIC VALVE                 PULMONIC VALVE AV Area (Vmax): 1.03 cm     PV Vmax:       0.92 m/s AV Vmax:        177.00 cm/s  PV Peak grad:  3.4 mmHg AV Peak Grad:   12.5 mmHg LVOT Vmax:      71.80 cm/s LVOT Vmean:     49.900 cm/s LVOT VTI:       0.155 m AI PHT:         603 msec  AORTA Ao Root diam: 3.10 cm Ao Asc diam:  3.40 cm MITRAL VALVE                 TRICUSPID VALVE MV Area (PHT): 3.46 cm     TV Peak grad:   17.8 mmHg MV Area VTI:   0.99 cm     TV Vmax:        2.11 m/s MV Peak grad:  8.3 mmHg MV Mean grad:  3.0 mmHg     SHUNTS MV Vmax:       1.44 m/s     Systemic VTI:  0.16 m MV Vmean:      70.6 cm/s    Systemic Diam: 1.80 cm MV Decel Time: 219 msec MV E velocity: 106.00 cm/s MV A velocity: 48.40 cm/s MV E/A ratio:  2.19 Donnelly Angelica Electronically signed by Donnelly Angelica Signature Date/Time: 04/26/2021/12:32:09 PM    Final    ASSESSMENT AND PLAN:  Bradie Sangiovanni is a 88 y.o. female with medical history significant of atrial fibrillation on Eliquis, hypertension, hyperlipidemia, asthma, TIA, GERD, hypothyroidism, sinoatrial node dysfunction (s/p of AICD placement), s/p biliary T-tube placement, who presented to the hospital with melena and shortness of breath.  Melena/G.I. bleed -- came in with hemoglobin of 5.4 status post three unit blood transfusion -- hemoglobin 9.8 -- denies any further bleeding or bright red blood -- seen by G.I. Dr. Alice Reichert plans for EGD colonoscopy tomorrow -- hold eliquis -- continue IV Protonix -- colonoscopy poor prep to be repeated tomorrow -- EGD preliminary report per Dr. Alice Reichert negative --1/3-- plan for colonoscopy again today  history of TIA and a fib -- rate controlled -- eliquis on hold  Hypothyroidism -- continue Synthroid  Hyponatremia -- patient came in with sodium of 123---129 --cont salt tabs for couple days --mentation stable  physical therapy to see patient today.  Procedures:EGD/colonscopy Family communication : none Consults : G.I. CODE STATUS: full DVT Prophylaxis : SCD Level of care: Telemetry Medical Status is: Inpatient  Remains inpatient appropriate because: anemia workup        TOTAL TIME TAKING CARE OF THIS PATIENT: 25 minutes.  >50% time spent on counselling and coordination of care  Note: This dictation was prepared with Dragon dictation along with  smaller phrase technology. Any transcriptional errors that result from this process are unintentional.  Fritzi Mandes M.D    [facility]   CC: Primary care physician; Ezequiel Kayser, MD (Inactive) Patient ID: Wauneta Silveria, female   DOB: 08/16/1924, 88 y.o.   MRN: [id number]

## 2021-04-27 NOTE — Evaluation (Signed)
Physical Therapy Evaluation Patient Details Name: Latricia Cerrito MRN: [id number] DOB: Jun 22, 1924 Today's Date: 04/27/2021  History of Present Illness  Pt admitted for acute GI bleed s/p multiple transfusions. Pending colonoscopy this date. Medical history significant of atrial fibrillation on Eliquis, hypertension, hyperlipidemia, asthma, TIA, GERD, hypothyroidism, sinoatrial node dysfunction (s/p of AICD placement), s/p biliary T-tube placement, who presented to the hospital with melena and shortness of breath.  Clinical Impression  Pt is a pleasant 88 year old female who was admitted for acute GI bleed. Pt performs bed mobility with mod I, transfers with cga, and ambulation with cga and RW. Pt demonstrates deficits with strength/mobility/endurance. Would benefit from skilled PT to address above deficits and promote optimal return to PLOF. Recommend transition to [facility] upon discharge from acute hospitalization.      Recommendations for follow up therapy are one component of a multi-disciplinary discharge planning process, led by the attending physician.  Recommendations may be updated based on patient status, additional functional criteria and insurance authorization.  Follow Up Recommendations Home health PT    Assistance Recommended at Discharge Set up Supervision/Assistance  Patient can return home with the following       Equipment Recommendations None recommended by PT  Recommendations for Other Services       Functional Status Assessment Patient has had a recent decline in their functional status and demonstrates the ability to make significant improvements in function in a reasonable and predictable amount of time.     Precautions / Restrictions Precautions Precautions: Fall Restrictions Weight Bearing Restrictions: No      Mobility  Bed Mobility Overal bed mobility: Modified Independent             General bed mobility comments: safe technique with upright  posture.    Transfers Overall transfer level: Needs assistance Equipment used: Rolling walker (2 wheels) Transfers: Sit to/from Stand Sit to Stand: Min guard           General transfer comment: safe technique with 1 cue for hand placement    Ambulation/Gait Ambulation/Gait assistance: Min guard Gait Distance (Feet): 100 Feet Assistive device: Rolling walker (2 wheels) Gait Pattern/deviations: Step-through pattern       General Gait Details: ambulated with RW with safe technique. Slight SOB symptoms noted, requesting to turn around.  Stairs            Wheelchair Mobility    Modified Rankin (Stroke Patients Only)       Balance Overall balance assessment: Needs assistance Sitting-balance support: Feet supported;Bilateral upper extremity supported Sitting balance-Leahy Scale: Good     Standing balance support: Bilateral upper extremity supported Standing balance-Leahy Scale: Fair                               Pertinent Vitals/Pain Pain Assessment: No/denies pain    Home Living Family/patient expects to be discharged to:: Private residence Living Arrangements: Alone;Other relatives (nephew stays during the day, but at his own residence) Available Help at Discharge: Family;Available PRN/intermittently Type of Home: House Home Access: Stairs to enter Entrance Stairs-Rails: None Entrance Stairs-Number of Steps: 1   Home Layout: One level Home Equipment: Conservation officer, nature (2 wheels);Rollator (4 wheels);Cane - single point      Prior Function Prior Level of Function : Independent/Modified Independent             Mobility Comments: typically uses rollator at home. Reports no recent falls  Hand Dominance        Extremity/Trunk Assessment   Upper Extremity Assessment Upper Extremity Assessment: Overall WFL for tasks assessed    Lower Extremity Assessment Lower Extremity Assessment: Overall WFL for tasks assessed        Communication   Communication: No difficulties  Cognition Arousal/Alertness: Awake/alert Behavior During Therapy: WFL for tasks assessed/performed Overall Cognitive Status: Within Functional Limits for tasks assessed                                          General Comments      Exercises     Assessment/Plan    PT Assessment Patient needs continued PT services  PT Problem List Decreased strength;Decreased activity tolerance;Decreased balance;Decreased mobility       PT Treatment Interventions DME instruction;Gait training;Therapeutic exercise    PT Goals (Current goals can be found in the Care Plan section)  Acute Rehab PT Goals Patient Stated Goal: to go home PT Goal Formulation: With patient Time For Goal Achievement: 05/11/21 Potential to Achieve Goals: Good    Frequency Min 2X/week     Co-evaluation               AM-PAC PT "6 Clicks" Mobility  Outcome Measure Help needed turning from your back to your side while in a flat bed without using bedrails?: None Help needed moving from lying on your back to sitting on the side of a flat bed without using bedrails?: None Help needed moving to and from a bed to a chair (including a wheelchair)?: A Little Help needed standing up from a chair using your arms (e.g., wheelchair or bedside chair)?: A Little Help needed to walk in hospital room?: A Little Help needed climbing 3-5 steps with a railing? : A Little 6 Click Score: 20    End of Session Equipment Utilized During Treatment: Gait belt Activity Tolerance: Patient tolerated treatment well Patient left: in bed;with bed alarm set Nurse Communication: Mobility status PT Visit Diagnosis: Muscle weakness (generalized) (M62.81);Difficulty in walking, not elsewhere classified (R26.2)    Time: 8657-8469 PT Time Calculation (min) (ACUTE ONLY): 11 min   Charges:   PT Evaluation $PT Eval Low Complexity: 1 Low          Greggory Stallion, PT,  DPT [phone]   Ryian Lynde 04/27/2021, 2:50 PM

## 2021-04-27 NOTE — Transfer of Care (Signed)
Immediate Anesthesia Transfer of Care Note  Patient: [name]  Procedure(s) Performed: COLONOSCOPY  Patient Location: PACU and Endoscopy Unit  Anesthesia Type:General  Level of Consciousness: drowsy and patient cooperative  Airway & Oxygen Therapy: Patient Spontanous Breathing  Post-op Assessment: Report given to RN and Post -op Vital signs reviewed and stable  Post vital signs: Reviewed and stable  Last Vitals:  Vitals Value Taken Time  BP 91/67 04/27/21 1323  Temp 36.1 C 04/27/21 1320  Pulse 59 04/27/21 1325  Resp 18 04/27/21 1325  SpO2 94 % 04/27/21 1325  Vitals shown include unvalidated device data.  Last Pain:  Vitals:   04/27/21 1320  TempSrc:   PainSc: Asleep      Patients Stated Pain Goal: 0 (12/82/08 1388)  Complications: No notable events documented.

## 2021-04-27 NOTE — Op Note (Signed)
[facility]astroenterology Patient Name: [name] Procedure Date: 04/27/2021 12:50 PM MRN: [id number] Account #: [id number] Date of Birth: [birth year] Admit Type: Inpatient Age: 88 Room: [facility] ENDO ROOM 3 Gender: Female Note Status: Finalized Instrument Name: Peds Colonoscope [id number] Procedure:             Colonoscopy Indications:           Melena, Iron deficiency anemia secondary to chronic                         blood loss Providers:             Benay Pike. Eliu Batch MD, MD Medicines:             Propofol per Anesthesia Complications:         No immediate complications. Procedure:             Pre-Anesthesia Assessment:                        - The risks and benefits of the procedure and the                         sedation options and risks were discussed with the                         patient. All questions were answered and informed                         consent was obtained.                        - Patient identification and proposed procedure were                         verified prior to the procedure by the nurse. The                         procedure was verified in the procedure room.                        - ASA Grade Assessment: III - A patient with severe                         systemic disease.                        - After reviewing the risks and benefits, the patient                         was deemed in satisfactory condition to undergo the                         procedure.                        After obtaining informed consent, the colonoscope was                         passed under direct vision. Throughout the procedure,  the patient's blood pressure, pulse, and oxygen                         saturations were monitored continuously. The                         Colonoscope was introduced through the anus and                         advanced to the the cecum, identified by appendiceal                          orifice and ileocecal valve. The colonoscopy was                         somewhat difficult due to fair quality of the colon                         preparation. Successful completion of the procedure                         was aided by lavage. The patient tolerated the                         procedure well. The quality of the bowel preparation                         was fair. The ileocecal valve, appendiceal orifice,                         and rectum were photographed. Findings:      The perianal and digital rectal examinations were normal. Pertinent       negatives include normal sphincter tone and no palpable rectal lesions.      An infiltrative and polypoid non-obstructing medium-sized mass was found       in the cecum. The mass was non-circumferential. The mass measured three       cm in length. In addition, its diameter measured three mm. No bleeding       was present. This was biopsied with a cold forceps for histology.      A few small-mouthed diverticula were found in the sigmoid colon.      The exam was otherwise without abnormality. Impression:            - Preparation of the colon was fair.                        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 Biopsied.                        - Diverticulosis in the sigmoid colon.                        - The examination was otherwise normal. Recommendation:        - Await pathology results.                        - Return patient to hospital ward for ongoing care.                        -  Full liquid diet.                        - STAT pathology diagnosis requested for mass.                         Oncology consult once diagnosis confirmed.                        - Continue present medications. Procedure Code(s):     --- Professional ---                        [phone], Colonoscopy, flexible; with biopsy, single or                         multiple Diagnosis Code(s):     --- Professional ---                        K57.30,  Diverticulosis of large intestine without                         perforation or abscess without bleeding                        D50.0, Iron deficiency anemia secondary to blood loss                         (chronic)                        K92.1, Melena (includes Hematochezia)                        D49.0, Neoplasm of unspecified behavior of digestive                         system CPT copyright 2019 [employer]. All rights reserved. The codes documented in this report are preliminary and upon coder review may  be revised to meet current compliance requirements. Efrain Sella MD, MD 04/27/2021 1:37:39 PM This report has been signed electronically. Number of Addenda: 0 Note Initiated On: 04/27/2021 12:50 PM Scope Withdrawal Time: 0 hours 7 minutes 1 second  Total Procedure Duration: 0 hours 12 minutes 6 seconds  Estimated Blood Loss:  Estimated blood loss was minimal. Estimated blood loss                         was minimal.      [facility]

## 2021-04-27 NOTE — Anesthesia Preprocedure Evaluation (Signed)
Anesthesia Evaluation  Patient identified by MRN, date of birth, ID band Patient awake    Reviewed: Allergy & Precautions, NPO status , Patient's Chart, lab work & pertinent test results  History of Anesthesia Complications Negative for: history of anesthetic complications  Airway Mallampati: III  TM Distance: >3 FB Neck ROM: Full    Dental  (+) Partial Upper, Poor Dentition, Missing, Chipped   Pulmonary asthma , neg sleep apnea, COPD,  COPD inhaler, Patient abstained from smoking.Not current smoker,    Pulmonary exam normal        Cardiovascular Exercise Tolerance: Good METShypertension, Pt. on medications (-) CAD, (-) Past MI and (-) CHF Normal cardiovascular exam+ dysrhythmias Atrial Fibrillation + pacemaker + Valvular Problems/Murmurs MR  - Systolic murmurs    Neuro/Psych TIA Transient Visual Symptoms TIA (Pt unsure) Neuromuscular disease negative psych ROS   GI/Hepatic Neg liver ROS, GERD  Medicated,  Endo/Other  neg diabetesHypothyroidism   Renal/GU negative Renal ROS  negative genitourinary   Musculoskeletal  (+) Arthritis , Osteoarthritis,    Abdominal   Peds negative pediatric ROS (+)  Hematology  (+) Blood dyscrasia, anemia , Eliquis last dose 09/15/20   Anesthesia Other Findings Past Medical History: No date: Arthritis No date: Asthma No date: Atrial fibrillation (HCC) No date: Cataract No date: Eczema No date: GERD (gastroesophageal reflux disease) No date: Gout of wrist No date: Heart murmur No date: Pacemaker No date: Pseudogout of ankle or foot No date: Thyroid disease No date: TIA (transient ischemic attack)  Reproductive/Obstetrics negative OB ROS                                                              Anesthesia Evaluation  Patient identified by MRN, date of birth, ID band Patient awake    Reviewed: Allergy & Precautions, NPO status , Patient's Chart, lab  work & pertinent test results  History of Anesthesia Complications Negative for: history of anesthetic complications  Airway Mallampati: II  TM Distance: >3 FB Neck ROM: Full    Dental  (+) Lower Dentures   Pulmonary asthma ,    breath sounds clear to auscultation- rhonchi (-) wheezing      Cardiovascular hypertension, (-) CAD, (-) Past MI, (-) Cardiac Stents and (-) CABG + dysrhythmias Atrial Fibrillation + pacemaker  Rhythm:Regular Rate:Normal - Systolic murmurs and - Diastolic murmurs    Neuro/Psych TIAnegative psych ROS   GI/Hepatic Neg liver ROS, GERD  ,  Endo/Other  neg diabetesHypothyroidism   Renal/GU negative Renal ROS     Musculoskeletal  (+) Arthritis ,   Abdominal (+) - obese,   Peds  Hematology negative hematology ROS (+)   Anesthesia Other Findings Past Medical History: No date: Arthritis No date: Asthma No date: Atrial fibrillation (HCC) No date: Cataract No date: Eczema No date: GERD (gastroesophageal reflux disease) No date: Gout of wrist No date: Heart murmur No date: Pacemaker No date: Pseudogout of ankle or foot No date: Thyroid disease No date: TIA (transient ischemic attack)   Reproductive/Obstetrics                             Anesthesia Physical Anesthesia Plan  ASA: III  Anesthesia Plan: General   Post-op Pain Management:  Induction: Intravenous  PONV Risk Score and Plan: 2 and Propofol infusion  Airway Management Planned: Natural Airway  Additional Equipment:   Intra-op Plan:   Post-operative Plan:   Informed Consent: I have reviewed the patients History and Physical, chart, labs and discussed the procedure including the risks, benefits and alternatives for the proposed anesthesia with the patient or authorized representative who has indicated his/her understanding and acceptance.     Dental advisory given  Plan Discussed with: CRNA and Anesthesiologist  Anesthesia Plan  Comments:         Anesthesia Quick Evaluation                                   Anesthesia Evaluation  Patient identified by MRN, date of birth, ID band Patient awake    Reviewed: Allergy & Precautions, NPO status , Patient's Chart, lab work & pertinent test results  History of Anesthesia Complications Negative for: history of anesthetic complications  Airway Mallampati: II  TM Distance: >3 FB Neck ROM: Full    Dental  (+) Lower Dentures   Pulmonary asthma ,    breath sounds clear to auscultation- rhonchi (-) wheezing      Cardiovascular hypertension, (-) CAD, (-) Past MI, (-) Cardiac Stents and (-) CABG + dysrhythmias Atrial Fibrillation + pacemaker  Rhythm:Regular Rate:Normal - Systolic murmurs and - Diastolic murmurs    Neuro/Psych TIAnegative psych ROS   GI/Hepatic Neg liver ROS, GERD  ,  Endo/Other  neg diabetesHypothyroidism   Renal/GU negative Renal ROS     Musculoskeletal  (+) Arthritis ,   Abdominal (+) - obese,   Peds  Hematology negative hematology ROS (+)   Anesthesia Other Findings Past Medical History: No date: Arthritis No date: Asthma No date: Atrial fibrillation (HCC) No date: Cataract No date: Eczema No date: GERD (gastroesophageal reflux disease) No date: Gout of wrist No date: Heart murmur No date: Pacemaker No date: Pseudogout of ankle or foot No date: Thyroid disease No date: TIA (transient ischemic attack)   Reproductive/Obstetrics                             Anesthesia Physical Anesthesia Plan  ASA: III  Anesthesia Plan: General   Post-op Pain Management:    Induction: Intravenous  PONV Risk Score and Plan: 2 and Propofol infusion  Airway Management Planned: Natural Airway  Additional Equipment:   Intra-op Plan:   Post-operative Plan:   Informed Consent: I have reviewed the patients History and Physical, chart, labs and discussed the procedure including the risks,  benefits and alternatives for the proposed anesthesia with the patient or authorized representative who has indicated his/her understanding and acceptance.     Dental advisory given  Plan Discussed with: CRNA and Anesthesiologist  Anesthesia Plan Comments:         Anesthesia Quick Evaluation                                   Anesthesia Evaluation  Patient identified by MRN, date of birth, ID band Patient awake    Reviewed: Allergy & Precautions, NPO status , Patient's Chart, lab work & pertinent test results  History of Anesthesia Complications Negative for: history of anesthetic complications  Airway Mallampati: II  TM Distance: >3 FB Neck ROM: Full  Dental  (+) Lower Dentures   Pulmonary asthma ,    breath sounds clear to auscultation- rhonchi (-) wheezing      Cardiovascular hypertension, (-) CAD, (-) Past MI, (-) Cardiac Stents and (-) CABG + dysrhythmias Atrial Fibrillation + pacemaker  Rhythm:Regular Rate:Normal - Systolic murmurs and - Diastolic murmurs    Neuro/Psych TIAnegative psych ROS   GI/Hepatic Neg liver ROS, GERD  ,  Endo/Other  neg diabetesHypothyroidism   Renal/GU negative Renal ROS     Musculoskeletal  (+) Arthritis ,   Abdominal (+) - obese,   Peds  Hematology negative hematology ROS (+)   Anesthesia Other Findings Past Medical History: No date: Arthritis No date: Asthma No date: Atrial fibrillation (HCC) No date: Cataract No date: Eczema No date: GERD (gastroesophageal reflux disease) No date: Gout of wrist No date: Heart murmur No date: Pacemaker No date: Pseudogout of ankle or foot No date: Thyroid disease No date: TIA (transient ischemic attack)   Reproductive/Obstetrics                             Anesthesia Physical Anesthesia Plan  ASA: III  Anesthesia Plan: General   Post-op Pain Management:    Induction: Intravenous  PONV Risk Score and Plan: 2 and Propofol  infusion  Airway Management Planned: Natural Airway  Additional Equipment:   Intra-op Plan:   Post-operative Plan:   Informed Consent: I have reviewed the patients History and Physical, chart, labs and discussed the procedure including the risks, benefits and alternatives for the proposed anesthesia with the patient or authorized representative who has indicated his/her understanding and acceptance.     Dental advisory given  Plan Discussed with: CRNA and Anesthesiologist  Anesthesia Plan Comments:         Anesthesia Quick Evaluation  Anesthesia Physical  Anesthesia Plan  ASA: 3  Anesthesia Plan: General   Post-op Pain Management: Minimal or no pain anticipated   Induction: Intravenous  PONV Risk Score and Plan: 3 and Propofol infusion, TIVA and Ondansetron  Airway Management Planned: Natural Airway  Additional Equipment: None  Intra-op Plan:   Post-operative Plan:   Informed Consent: I have reviewed the patients History and Physical, chart, labs and discussed the procedure including the risks, benefits and alternatives for the proposed anesthesia with the patient or authorized representative who has indicated his/her understanding and acceptance.     Dental advisory given  Plan Discussed with: CRNA and Surgeon  Anesthesia Plan Comments: (Patient consented for risks of anesthesia including but not limited to:  - adverse reactions to medications - risk of airway placement if required - damage to eyes, teeth, lips or other oral mucosa - nerve damage due to positioning  - sore throat or hoarseness - Damage to heart, brain, nerves, lungs, other parts of body or loss of life  Patient voiced understanding.)        Anesthesia Quick Evaluation

## 2021-04-27 NOTE — Interval H&P Note (Signed)
History and Physical Interval Note:  04/27/2021 11:47 AM  [name]  has presented today for surgery, with the diagnosis of melena, anemia, heme positive stools.  The various methods of treatment have been discussed with the patient and family. After consideration of risks, benefits and other options for treatment, the patient has consented to  Procedure(s): COLONOSCOPY (N/A) as a surgical intervention.  The patient's history has been reviewed, patient examined, no change in status, stable for surgery.  I have reviewed the patient's chart and labs.  Questions were answered to the patient's satisfaction.     [place]

## 2021-04-27 NOTE — Discharge Summary (Signed)
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 female with medical history significant of atrial fibrillation on Eliquis, hypertension, hyperlipidemia, asthma, TIA, GERD, hypothyroidism, sinoatrial node dysfunction (s/p of AICD placement), s/p biliary T-tube placement, who presented to the hospital with melena and shortness of breath.   Melena/G.I. bleed Acute on chronic blood loss Iron deficiency anemia -- came in with hemoglobin of 5.4 status post three unit blood transfusion -- hemoglobin 9.8 -- denies any further bleeding or bright red blood -- seen by G.I. Dr. Alice Reichert plans for EGD colonoscopy tomorrow -- hold eliquis -- continue IV Protonix -- colonoscopy poor prep to be repeated tomorrow -- EGD preliminary report per Dr. 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 Bx taken. --pt to f/u Dr Grayland Ormond (discussed) on jan 5th in the morning for further w/u -CEA ordered --I have advised pt to hold her  eliquis for now. She can discuss about resuming it with Oncology or PCP dr Raechel Ache   history of TIA and a fib -- rate controlled -- eliquis on hold for now--defer to PCP at f/u to resume if hgb remains stable   Hypothyroidism -- continue Synthroid   Hyponatremia -- patient came in with sodium of 123---129 --mentation stable   physical therapy  recommends HHPT  Procedures:EGD/colonscopy Family communication : discharge plan d/w cathy (neighbor) and carl swanson (nephew) Consults : G.I. CODE STATUS: full DVT Prophylaxis : SCD Level of care: Telemetry Medical Status is: Inpatient   D/c home     CONSULTS OBTAINED:  Treatment Team:  Efrain Sella, MD  DRUG ALLERGIES:   Allergies  Allergen Reactions   Fluticasone-Salmeterol Anaphylaxis   Albuterol     Other reaction(s): Other (See Comments) It burns and makes her cough "too strong for me or something"   Ciprofloxacin Other (See Comments) and Hives    Other reaction(s): Unknown Does not remember   Codeine Hives   Colchicine Diarrhea   Other Other (See Comments)   Promethazine Other (See Comments)    Unable to speak. Loss muscle control (PHENGERAN)   Valacyclovir     Other reaction(s): Muscle Pain Could hardly move   Guaifenesin Palpitations   Sulfa Antibiotics Rash and Hives    DISCHARGE MEDICATIONS:   Allergies as of 04/27/2021       Reactions   Fluticasone-salmeterol Anaphylaxis   Albuterol    Other reaction(s): Other (See Comments) It burns and makes her  cough "too strong for me or something"   Ciprofloxacin Other (See Comments), Hives   Other reaction(s): Unknown Does not remember   Codeine Hives   Colchicine Diarrhea   Other Other (See Comments)   Promethazine Other (See Comments)   Unable to speak. Loss muscle control (PHENGERAN)   Valacyclovir    Other reaction(s): Muscle Pain Could hardly move   Guaifenesin Palpitations   Sulfa Antibiotics Rash, Hives        Medication List     STOP  taking these medications    apixaban 5 MG Tabs tablet Commonly known as: ELIQUIS       TAKE these medications    acetaminophen 650 MG CR tablet Commonly known as: TYLENOL Take 650 mg by mouth every 8 (eight) hours as needed for pain.   atorvastatin 40 MG tablet Commonly known as: LIPITOR Take 40 mg by mouth every evening.   budesonide-formoterol 80-4.5 MCG/ACT inhaler Commonly known as: SYMBICORT Inhale 2 puffs into the lungs 2 (two) times daily.   docusate sodium 100 MG capsule Commonly known as: COLACE Take 1 capsule (100 mg total) by mouth 2 (two) times daily.   eucerin cream Apply topically as needed for dry skin. Apply to tops of feet   famotidine 20 MG tablet Commonly known as: PEPCID Take 20 mg by mouth 2 (two) times daily.   ferrous sulfate 325 (65 FE) MG tablet Take 1 tablet (325 mg total) by mouth 2 (two) times daily with a meal.   levothyroxine 112 MCG tablet Commonly known as: SYNTHROID Take 112 mcg by mouth daily before breakfast.   montelukast 10 MG tablet Commonly known as: SINGULAIR Take 10 mg by mouth at bedtime.   Olopatadine HCl 0.7 % Soln Apply 1 drop to eye at bedtime.   Omega-3 1000 MG Caps Take 1,000 mg by mouth daily.   Vitamin D3 50 MCG (2000 UT) Tabs Take 2,000 Units by mouth daily.   Voltaren 1 % Gel Generic drug: diclofenac Sodium Apply 2 g topically daily as needed (pain).        If you experience worsening of your admission symptoms, develop shortness of breath, life threatening emergency, suicidal or homicidal thoughts you must seek medical attention immediately by calling 911 or calling your MD immediately  if symptoms less severe.  You Must read complete instructions/literature along with all the possible adverse reactions/side effects for all the Medicines you take and that have been prescribed to you. Take any new Medicines after you have completely understood and accept all the possible adverse reactions/side effects.    Please note  You were cared for by a hospitalist during your hospital stay. If you have any questions about your discharge medications or the care you received while you were in the hospital after you are discharged, you can call the unit and asked to speak with the hospitalist on call if the hospitalist that took care of you is not available. Once you are discharged, your primary care physician will handle any further medical issues. Please note that NO REFILLS for any discharge medications will be authorized once you are discharged, as it is imperative that you return to your primary care physician (or establish a relationship with a primary care physician if you do not have one) for your aftercare needs so that they can reassess your need for medications and monitor your lab values. Today   SUBJECTIVE   No new complaints  VITAL SIGNS:  Blood pressure 108/78, pulse 60, temperature (!)  96.9 F (36.1 C), resp. rate 20, height 5\' 4"  (1.626 m), weight 54.4 kg, SpO2 93 %.  I/O:   Intake/Output Summary (Last 24 hours) at 04/27/2021 1510 Last data filed at 04/27/2021 1325 Gross per 24 hour  Intake 200 ml  Output --  Net 200 ml    PHYSICAL EXAMINATION:  GENERAL:  88 y.o.-year-old patient lying in the bed with no acute distress.  LUNGS: Normal breath sounds bilaterally, no wheezing, rales,rhonchi or crepitation. No use of accessory muscles of respiration.  CARDIOVASCULAR: S1, S2 normal. No murmurs, rubs, or gallops.  ABDOMEN: Soft, non-tender, non-distended. Bowel sounds present. No organomegaly or mass.  EXTREMITIES: No pedal edema, cyanosis, or clubbing.  NEUROLOGIC: non-focal PSYCHIATRIC:  patient is alert and oriented x 3.  SKIN: No obvious rash, lesion, or ulcer.   DATA REVIEW:   CBC  Recent Labs  Lab 04/25/21 0123  WBC 13.0*  HGB 9.8*  HCT 30.9*  PLT 286    Chemistries  Recent Labs  Lab 04/23/21 0248 04/23/21 0838 04/25/21 0528  NA 123*   < > 129*  K 3.6   < > 3.4*   CL 93*   < > 97*  CO2 25   < > 23  GLUCOSE 95   < > 82  BUN 18   < > 10  CREATININE 0.58   < > 0.59  CALCIUM 8.2*   < > 8.3*  AST 21  --   --   ALT 17  --   --   ALKPHOS 109  --   --   BILITOT 0.7  --   --    < > = values in this interval not displayed.    Microbiology Results   Recent Results (from the past 240 hour(s))  Resp Panel by RT-PCR (Flu A&B, Covid) Nasopharyngeal Swab     Status: None   Collection Time: 04/23/21  2:48 AM   Specimen: Nasopharyngeal Swab; Nasopharyngeal(NP) swabs in vial transport medium  Result Value Ref Range Status   SARS Coronavirus 2 by RT PCR NEGATIVE NEGATIVE Final    Comment: (NOTE) SARS-CoV-2 target nucleic acids are NOT DETECTED.  The SARS-CoV-2 RNA is generally detectable in upper respiratory specimens during the acute phase of infection. The lowest concentration of SARS-CoV-2 viral copies this assay can detect is 138 copies/mL. A negative result does not preclude SARS-Cov-2 infection and should not be used as the sole basis for treatment or other patient management decisions. A negative result may occur with  improper specimen collection/handling, submission of specimen other than nasopharyngeal swab, presence of viral mutation(s) within the areas targeted by this assay, and inadequate number of viral copies(<138 copies/mL). A negative result must be combined with clinical observations, patient history, and epidemiological information. The expected result is Negative.  Fact Sheet for Patients:  EntrepreneurPulse.com.au  Fact Sheet for Healthcare Providers:  IncredibleEmployment.be  This test is no t yet approved or cleared by the [place] FDA and  has been authorized for detection and/or diagnosis of SARS-CoV-2 by FDA under an Emergency Use Authorization (EUA). This EUA will remain  in effect (meaning this test can be used) for the duration of the COVID-19 declaration under Section 564(b)(1)  of the Act, 21 U.S.C.section 360bbb-3(b)(1), unless the authorization is terminated  or revoked sooner.       Influenza A by PCR NEGATIVE NEGATIVE Final   Influenza B by PCR NEGATIVE NEGATIVE Final    Comment: (NOTE) The Xpert Xpress SARS-CoV-2/FLU/RSV plus assay is  intended as an aid in the diagnosis of influenza from Nasopharyngeal swab specimens and should not be used as a sole basis for treatment. Nasal washings and aspirates are unacceptable for Xpert Xpress SARS-CoV-2/FLU/RSV testing.  Fact Sheet for Patients: EntrepreneurPulse.com.au  Fact Sheet for Healthcare Providers: IncredibleEmployment.be  This test is not yet approved or cleared by the [place] FDA and has been authorized for detection and/or diagnosis of SARS-CoV-2 by FDA under an Emergency Use Authorization (EUA). This EUA will remain in effect (meaning this test can be used) for the duration of the COVID-19 declaration under Section 564(b)(1) of the Act, 21 U.S.C. section 360bbb-3(b)(1), unless the authorization is terminated or revoked.  Performed at [facility], [address] [place], [place] [address]     RADIOLOGY:  Bea Graff Portable 1V  Result Date: 04/27/2021 CLINICAL DATA:  Constipation.  Scheduled for colonoscopy today. EXAM: PORTABLE ABDOMEN - 1 VIEW COMPARISON:  CT 07/25/2019 FINDINGS: There is gas within small and large bowel but no abnormally dilated loops. I do not identify any definite residual fecal matter. IMPRESSION: Gas in small and large bowel but no abnormally dilated loops. No definite residual fecal matter by radiography. Electronically Signed   By: Nelson Chimes M.D.   On: 04/27/2021 11:13   ECHOCARDIOGRAM COMPLETE  Result Date: 04/26/2021    ECHOCARDIOGRAM REPORT   Patient Name:   [name] Date of Exam: 04/25/2021 Medical Rec #:  [id number]            Height:       64.0 in Accession #:    [id number]           Weight:       125.0  lb Date of Birth:  1924/06/30           BSA:          1.602 m Patient Age:    41 years             BP:           164/74 mmHg Patient Gender: F                    HR:           60 bpm. Exam Location:  [facility] Procedure: 2D Echo Indications:     HF I50.31  History:         Patient has no prior history of Echocardiogram examinations.  Sonographer:     Kathlen Brunswick RDCS Referring Phys:  Unknown Foley NIU Diagnosing Phys: Donnelly Angelica IMPRESSIONS  1. Left ventricular ejection fraction, by estimation, is 60 to 65%. The left ventricle has normal function. The left ventricle has no regional wall motion abnormalities. Left ventricular diastolic parameters are consistent with Grade I diastolic dysfunction (impaired relaxation).  2. Right ventricular systolic function is normal. The right ventricular size is normal.  3. Left atrial size was severely dilated.  4. The mitral valve is normal in structure. Mild mitral valve regurgitation. No evidence of mitral stenosis.  5. The aortic valve is normal in structure. Aortic valve regurgitation is mild. Aortic valve sclerosis is present, with no evidence of aortic valve stenosis. FINDINGS  Left Ventricle: Left ventricular ejection fraction, by estimation, is 60 to 65%. The left ventricle has normal function. The left ventricle has no regional wall motion abnormalities. The left ventricular internal cavity size was normal in size. There is  no left ventricular hypertrophy. Left ventricular diastolic parameters are consistent with  Grade I diastolic dysfunction (impaired relaxation). Right Ventricle: The right ventricular size is normal. No increase in right ventricular wall thickness. Right ventricular systolic function is normal. Left Atrium: Left atrial size was severely dilated. Right Atrium: Right atrial size was normal in size. Pericardium: There is no evidence of pericardial effusion. Mitral Valve: The mitral valve is normal in structure. Mild mitral valve regurgitation. No evidence  of mitral valve stenosis. MV peak gradient, 8.3 mmHg. The mean mitral valve gradient is 3.0 mmHg. Tricuspid Valve: The tricuspid valve is normal in structure. Tricuspid valve regurgitation is mild. Aortic Valve: The aortic valve is normal in structure. Aortic valve regurgitation is mild. Aortic regurgitation PHT measures 603 msec. Aortic valve sclerosis is present, with no evidence of aortic valve stenosis. Aortic valve peak gradient measures 12.5 mmHg. Pulmonic Valve: The pulmonic valve was not well visualized. Pulmonic valve regurgitation is not visualized. No evidence of pulmonic stenosis. Aorta: The aortic root and ascending aorta are structurally normal, with no evidence of dilitation. IAS/Shunts: No atrial level shunt detected by color flow Doppler.  LEFT VENTRICLE PLAX 2D LVIDd:         4.20 cm     Diastology LVIDs:         2.90 cm     LV e' medial:    8.05 cm/s LV PW:         1.10 cm     LV E/e' medial:  13.2 LV IVS:        1.00 cm     LV e' lateral:   6.09 cm/s LVOT diam:     1.80 cm     LV E/e' lateral: 17.4 LV SV:         39 LV SV Index:   25 LVOT Area:     2.54 cm  LV Volumes (MOD) LV vol d, MOD A2C: 38.3 ml LV vol d, MOD A4C: 41.6 ml LV vol s, MOD A2C: 8.4 ml LV vol s, MOD A4C: 9.8 ml LV SV MOD A2C:     29.9 ml LV SV MOD A4C:     41.6 ml LV SV MOD BP:      31.6 ml RIGHT VENTRICLE RV Basal diam:  2.40 cm RV S prime:     10.90 cm/s TAPSE (M-mode): 1.6 cm LEFT ATRIUM             Index        RIGHT ATRIUM           Index LA diam:        4.10 cm 2.56 cm/m   RA Area:     16.50 cm LA Vol (A2C):   84.0 ml 52.43 ml/m  RA Volume:   35.60 ml  22.22 ml/m LA Vol (A4C):   76.2 ml 47.56 ml/m LA Biplane Vol: 87.3 ml 54.49 ml/m  AORTIC VALVE                 PULMONIC VALVE AV Area (Vmax): 1.03 cm     PV Vmax:       0.92 m/s AV Vmax:        177.00 cm/s  PV Peak grad:  3.4 mmHg AV Peak Grad:   12.5 mmHg LVOT Vmax:      71.80 cm/s LVOT Vmean:     49.900 cm/s LVOT VTI:       0.155 m AI PHT:         603 msec  AORTA Ao  Root diam: 3.10 cm  Ao Asc diam:  3.40 cm MITRAL VALVE                TRICUSPID VALVE MV Area (PHT): 3.46 cm     TV Peak grad:   17.8 mmHg MV Area VTI:   0.99 cm     TV Vmax:        2.11 m/s MV Peak grad:  8.3 mmHg MV Mean grad:  3.0 mmHg     SHUNTS MV Vmax:       1.44 m/s     Systemic VTI:  0.16 m MV Vmean:      70.6 cm/s    Systemic Diam: 1.80 cm MV Decel Time: 219 msec MV E velocity: 106.00 cm/s MV A velocity: 48.40 cm/s MV E/A ratio:  2.19 Donnelly Angelica Electronically signed by Donnelly Angelica Signature Date/Time: 04/26/2021/12:32:09 PM    Final      CODE STATUS:     Code Status Orders  (From admission, onward)           Start     Ordered   04/23/21 0731  Full code  Continuous        04/23/21 0731           Code Status History     Date Active Date Inactive Code Status Order ID Comments User Context   09/15/2020 2304 09/19/2020 2011 Full Code [id number]  Sidney Ace Arvella Merles, MD ED   07/25/2019 1621 07/28/2019 1550 Full Code [id number]  Olean Ree, MD ED   05/29/2019 1311 05/29/2019 2020 Full Code [id number]  Isaias Cowman, MD Inpatient        TOTAL TIME TAKING CARE OF THIS PATIENT: 40 minutes.    Fritzi Mandes M.D  [facility]    CC: Primary care physician; Ezequiel Kayser, MD (Inactive)

## 2021-04-27 NOTE — TOC Progression Note (Addendum)
Transition of Care [facility]) - Progression Note    Patient Details  Name: [name] MRN: [id number] Date of Birth: [birth year]  [facility] CM/SW Contact  Beverly Sessions, RN Phone Number: 04/27/2021, 2:15 PM  Clinical Narrative:    Received secure chat from PT notifying me recommendations would be home with home health.  No DME needs.   Patient in agreement.  She deferred me to discuss with nephew.  Glendell Docker states that he does not have a preference of agency.  Referral made to [facility] with Alvis Lemmings   Update:  Tommi Rumps with Alvis Lemmings notified of discharge    Expected Discharge Plan: [place] Barriers to Discharge: Continued Medical Work up  Expected Discharge Plan and Services Expected Discharge Plan: [place]   Discharge Planning Services: CM Consult   Living arrangements for the past 2 months: Single Family Home                                       Social Determinants of Health (SDOH) Interventions    Readmission Risk Interventions Readmission Risk Prevention Plan 04/26/2021  Transportation Screening Complete  HRI or [place] Complete  Social Work Consult for [place] Planning/Counseling Complete  Palliative Care Screening Not Applicable  Medication Review Press photographer) Complete  Some recent data might be hidden

## 2021-04-28 ENCOUNTER — Encounter: Payer: Self-pay | Admitting: Internal Medicine

## 2021-04-28 ENCOUNTER — Other Ambulatory Visit: Payer: Self-pay | Admitting: Pathology

## 2021-04-28 LAB — SURGICAL PATHOLOGY

## 2021-04-28 LAB — CEA: CEA: 3.5 ng/mL (ref 0.0–4.7)

## 2021-04-28 NOTE — Anesthesia Postprocedure Evaluation (Signed)
Anesthesia Post Note  Patient: [name]  Procedure(s) Performed: COLONOSCOPY  Patient location during evaluation: Endoscopy Anesthesia Type: General Level of consciousness: awake and alert Pain management: pain level controlled Vital Signs Assessment: post-procedure vital signs reviewed and stable Respiratory status: spontaneous breathing, nonlabored ventilation, respiratory function stable and patient connected to nasal cannula oxygen Cardiovascular status: blood pressure returned to baseline and stable Postop Assessment: no apparent nausea or vomiting Anesthetic complications: no   No notable events documented.   Last Vitals:  Vitals:   04/27/21 1340 04/27/21 1534  BP: 108/78 124/60  Pulse: 60 63  Resp: 20 18  Temp:  36.7 C  SpO2:  99%    Last Pain:  Vitals:   04/27/21 1534  TempSrc: Oral  PainSc:                  [name] [name]

## 2021-04-29 ENCOUNTER — Inpatient Hospital Stay: Payer: Medicare Other | Attending: Oncology | Admitting: Oncology

## 2021-04-29 ENCOUNTER — Encounter: Payer: Self-pay | Admitting: Oncology

## 2021-04-29 ENCOUNTER — Other Ambulatory Visit: Payer: Self-pay

## 2021-04-29 ENCOUNTER — Ambulatory Visit
Admission: RE | Admit: 2021-04-29 | Discharge: 2021-04-29 | Disposition: A | Discharge status: Home / Self Care | Payer: Medicare Other | Source: Ambulatory Visit | Attending: Oncology | Admitting: Oncology

## 2021-04-29 DIAGNOSIS — C180 Malignant neoplasm of cecum: Secondary | ICD-10-CM | POA: Insufficient documentation

## 2021-04-29 DIAGNOSIS — D649 Anemia, unspecified: Secondary | ICD-10-CM | POA: Diagnosis not present

## 2021-04-29 DIAGNOSIS — D72829 Elevated white blood cell count, unspecified: Secondary | ICD-10-CM | POA: Diagnosis not present

## 2021-04-29 MED ORDER — IOHEXOL 300 MG/ML  SOLN
100.0000 mL | Freq: Once | INTRAMUSCULAR | Status: AC | PRN
  Administered 2021-04-29: 100 mL via INTRAVENOUS

## 2021-04-29 NOTE — Progress Notes (Signed)
[name]  Telephone:([phone]) [phone] Fax:(336) [phone]  ID: [name] OB: 07-08-1924  MR#: [id number]  SEG#:[id number]  Patient Care Team: Ezequiel Kayser, MD (Inactive) as PCP - General (Internal Medicine) [name], MD as Referring Physician (Family Medicine) [name], RN as Oncology Nurse Navigator  CHIEF COMPLAINT: Colon cancer  INTERVAL HISTORY: Patient is a 88 year old female who recently presented to the hospital with weakness and anemia and subsequently was found to have a cecal mass on colonoscopy.  Biopsies confirmed adenocarcinoma.  She continues to have weakness and fatigue, but otherwise feels well.  She has no neurologic complaints.  She denies any recent fevers.  She has a good appetite and denies weight loss.  She has no chest pain, shortness of breath, cough, or hemoptysis.  She denies any nausea, vomiting, constipation, or diarrhea.  She has no melena or hematochezia.  She has no urinary complaints.  Patient offers no further specific complaints today.  REVIEW OF SYSTEMS:   Review of Systems  Constitutional:  Positive for malaise/fatigue. Negative for fever and weight loss.  Respiratory: Negative.  Negative for cough, hemoptysis and shortness of breath.   Cardiovascular: Negative.  Negative for chest pain and leg swelling.  Gastrointestinal: Negative.  Negative for abdominal pain, blood in stool and melena.  Genitourinary: Negative.  Negative for dysuria.  Musculoskeletal: Negative.  Negative for back pain.  Skin: Negative.  Negative for rash.  Neurological:  Positive for weakness. Negative for dizziness, focal weakness and headaches.  Psychiatric/Behavioral: Negative.  The patient is not nervous/anxious.    As per HPI. Otherwise, a complete review of systems is negative.  PAST MEDICAL HISTORY: Past Medical History:  Diagnosis Date   Arthritis    Asthma    Atrial fibrillation (HCC)    Cataract    Eczema    GERD  (gastroesophageal reflux disease)    Gout of wrist    Heart murmur    Pacemaker    Pseudogout of ankle or foot    Thyroid disease    TIA (transient ischemic attack)     PAST SURGICAL HISTORY: Past Surgical History:  Procedure Laterality Date   ABLATION     COLONOSCOPY N/A 04/26/2021   Procedure: COLONOSCOPY;  Surgeon: Toledo, Benay Pike, MD;  Location: [facility];  Service: Gastroenterology;  Laterality: N/A;   COLONOSCOPY N/A 04/27/2021   Procedure: COLONOSCOPY;  Surgeon: Toledo, Benay Pike, MD;  Location: [facility] ENDOSCOPY;  Service: Gastroenterology;  Laterality: N/A;   ESOPHAGOGASTRODUODENOSCOPY N/A 04/26/2021   Procedure: ESOPHAGOGASTRODUODENOSCOPY (EGD);  Surgeon: Toledo, Benay Pike, MD;  Location: [facility] ENDOSCOPY;  Service: Gastroenterology;  Laterality: N/A;   EYE SURGERY Bilateral    cataract   foot sugery     HIP ARTHROPLASTY     right   IMPLANTABLE CARDIOVERTER DEFIBRILLATOR (ICD) GENERATOR CHANGE Left 05/29/2019   Procedure: PACEMAKER CHANGE OUT;  Surgeon: Isaias Cowman, MD;  Location: [facility] ORS;  Service: Cardiovascular;  Laterality: Left;   INTRAMEDULLARY (IM) NAIL INTERTROCHANTERIC Left 09/16/2020   Procedure: INTRAMEDULLARY (IM) NAIL INTERTROCHANTRIC;  Surgeon: Renee Harder, MD;  Location: [facility] ORS;  Service: Orthopedics;  Laterality: Left;   PACEMAKER INSERTION      FAMILY HISTORY: Family History  Problem Relation Age of Onset   Leukemia Father    Lung cancer Sister    Breast cancer Neg Hx     ADVANCED DIRECTIVES (Y/N):  N  HEALTH MAINTENANCE: Social History   Tobacco Use   Smoking status: Never   Smokeless tobacco: Never  Vaping Use   Vaping Use: Never used  Substance Use Topics   Alcohol use: Not Currently    Alcohol/week: 0.0 standard drinks   Drug use: No     Colonoscopy:  PAP:  Bone density:  Lipid panel:  Allergies  Allergen Reactions   Fluticasone-Salmeterol Anaphylaxis   Albuterol     Other reaction(s): Other (See Comments) It  burns and makes her cough "too strong for me or something"   Ciprofloxacin Other (See Comments) and Hives    Other reaction(s): Unknown Does not remember   Codeine Hives   Colchicine Diarrhea   Other Other (See Comments)   Promethazine Other (See Comments)    Unable to speak. Loss muscle control (PHENGERAN)   Valacyclovir     Other reaction(s): Muscle Pain Could hardly move   Guaifenesin Palpitations   Sulfa Antibiotics Rash and Hives    Current Outpatient Medications  Medication Sig Dispense Refill   acetaminophen (TYLENOL) 650 MG CR tablet Take 650 mg by mouth every 8 (eight) hours as needed for pain.     atorvastatin (LIPITOR) 40 MG tablet Take 40 mg by mouth every evening.      budesonide-formoterol (SYMBICORT) 80-4.5 MCG/ACT inhaler Inhale 2 puffs into the lungs 2 (two) times daily.      Cholecalciferol (VITAMIN D3) 50 MCG (2000 UT) TABS Take 2,000 Units by mouth daily.      diclofenac Sodium (VOLTAREN) 1 % GEL Apply 2 g topically daily as needed (pain).      docusate sodium (COLACE) 100 MG capsule Take 1 capsule (100 mg total) by mouth 2 (two) times daily. 10 capsule 0   famotidine (PEPCID) 20 MG tablet Take 20 mg by mouth 2 (two) times daily.      levothyroxine (SYNTHROID, LEVOTHROID) 112 MCG tablet Take 112 mcg by mouth daily before breakfast.     montelukast (SINGULAIR) 10 MG tablet Take 10 mg by mouth at bedtime.      Olopatadine HCl 0.7 % SOLN Apply 1 drop to eye at bedtime.      Omega-3 1000 MG CAPS Take 1,000 mg by mouth daily.      Skin Protectants, Misc. (EUCERIN) cream Apply topically as needed for dry skin. Apply to tops of feet 454 g 0   ferrous sulfate 325 (65 FE) MG tablet Take 1 tablet (325 mg total) by mouth 2 (two) times daily with a meal. (Patient not taking: Reported on 04/29/2021) 60 tablet 3   No current facility-administered medications for this visit.    OBJECTIVE: Vitals:   04/29/21 0917  BP: (!) 135/56  Pulse: (!) 56  Resp: 16  Temp: 97.6 F (36.4  C)  SpO2: 94%     Body mass index is 22.71 kg/m.    ECOG FS:1 - Symptomatic but completely ambulatory  General: Well-developed, well-nourished, no acute distress. Eyes: Pink conjunctiva, anicteric sclera. HEENT: Normocephalic, moist mucous membranes. Lungs: No audible wheezing or coughing. Heart: Regular rate and rhythm. Abdomen: Soft, nontender, no obvious distention. Musculoskeletal: No edema, cyanosis, or clubbing. Neuro: Alert, answering all questions appropriately. Cranial nerves grossly intact. Skin: No rashes or petechiae noted. Psych: Normal affect. Lymphatics: No cervical, calvicular, axillary or inguinal LAD.   LAB RESULTS:  Lab Results  Component Value Date   NA 129 (L) 04/25/2021   K 3.4 (L) 04/25/2021   CL 97 (L) 04/25/2021   CO2 23 04/25/2021   GLUCOSE 82 04/25/2021   BUN 10 04/25/2021   CREATININE 0.59 04/25/2021   CALCIUM  8.3 (L) 04/25/2021   PROT 6.5 04/23/2021   ALBUMIN 3.1 (L) 04/23/2021   AST 21 04/23/2021   ALT 17 04/23/2021   ALKPHOS 109 04/23/2021   BILITOT 0.7 04/23/2021   GFRNONAA >60 04/25/2021   GFRAA >60 12/10/2019    Lab Results  Component Value Date   WBC 13.0 (H) 04/25/2021   NEUTROABS 6.9 04/23/2021   HGB 9.8 (L) 04/25/2021   HCT 30.9 (L) 04/25/2021   MCV 72.0 (L) 04/25/2021   PLT 286 04/25/2021     STUDIES: DG Chest Portable 1 View  Result Date: 04/23/2021 CLINICAL DATA:  Worsening shortness of breath. EXAM: PORTABLE CHEST 1 VIEW COMPARISON:  Sep 15, 2020 FINDINGS: A dual lead AICD is present with stable lead wire positioning. Mild diffusely increased interstitial lung markings are seen with moderate severity prominence of the pulmonary vasculature. Mild to moderate severity atelectasis and/or infiltrate is seen within the right lung base. The cardiac silhouette is mildly enlarged. Multilevel degenerative changes noted throughout the thoracic spine. IMPRESSION: 1. Cardiomegaly with moderate severity congestive heart failure. 2.  Mild to moderate severity right basilar atelectasis and/or infiltrate. Electronically Signed   By: Virgina Norfolk M.D.   On: 04/23/2021 03:55   DG Knee Complete 4 Views Right  Result Date: 04/05/2021 CLINICAL DATA:  Right knee pain and swelling. EXAM: RIGHT KNEE - COMPLETE 4+ VIEW COMPARISON:  None. FINDINGS: There is no acute fracture or dislocation. The bones are osteopenic there is mild arthritic changes of the lateral compartment. Moderate joint effusion noted. The soft tissues are grossly unremarkable. IMPRESSION: 1. No acute fracture or dislocation. 2. Arthritic changes of the lateral compartment and moderate suprapatellar effusion. Electronically Signed   By: Anner Crete M.D.   On: 04/05/2021 20:35   DG Abd Portable 1V  Result Date: 04/27/2021 CLINICAL DATA:  Constipation.  Scheduled for colonoscopy today. EXAM: PORTABLE ABDOMEN - 1 VIEW COMPARISON:  CT 07/25/2019 FINDINGS: There is gas within small and large bowel but no abnormally dilated loops. I do not identify any definite residual fecal matter. IMPRESSION: Gas in small and large bowel but no abnormally dilated loops. No definite residual fecal matter by radiography. Electronically Signed   By: Nelson Chimes M.D.   On: 04/27/2021 11:13   ECHOCARDIOGRAM COMPLETE  Result Date: 04/26/2021    ECHOCARDIOGRAM REPORT   Patient Name:   [name] Cerveny Date of Exam: 04/25/2021 Medical Rec #:  [id number]            Height:       64.0 in Accession #:    [id number]           Weight:       125.0 lb Date of Birth:  1925/03/18           BSA:          1.602 m Patient Age:    36 years             BP:           164/74 mmHg Patient Gender: F                    HR:           60 bpm. Exam Location:  [facility] Procedure: 2D Echo Indications:     HF I50.31  History:         Patient has no prior history of Echocardiogram examinations.  Sonographer:     Kathlen Brunswick RDCS Referring Phys:  [place] Diagnosing Phys: Donnelly Angelica IMPRESSIONS  1. Left  ventricular ejection fraction, by estimation, is 60 to 65%. The left ventricle has normal function. The left ventricle has no regional wall motion abnormalities. Left ventricular diastolic parameters are consistent with Grade I diastolic dysfunction (impaired relaxation).  2. Right ventricular systolic function is normal. The right ventricular size is normal.  3. Left atrial size was severely dilated.  4. The mitral valve is normal in structure. Mild mitral valve regurgitation. No evidence of mitral stenosis.  5. The aortic valve is normal in structure. Aortic valve regurgitation is mild. Aortic valve sclerosis is present, with no evidence of aortic valve stenosis. FINDINGS  Left Ventricle: Left ventricular ejection fraction, by estimation, is 60 to 65%. The left ventricle has normal function. The left ventricle has no regional wall motion abnormalities. The left ventricular internal cavity size was normal in size. There is  no left ventricular hypertrophy. Left ventricular diastolic parameters are consistent with Grade I diastolic dysfunction (impaired relaxation). Right Ventricle: The right ventricular size is normal. No increase in right ventricular wall thickness. Right ventricular systolic function is normal. Left Atrium: Left atrial size was severely dilated. Right Atrium: Right atrial size was normal in size. Pericardium: There is no evidence of pericardial effusion. Mitral Valve: The mitral valve is normal in structure. Mild mitral valve regurgitation. No evidence of mitral valve stenosis. MV peak gradient, 8.3 mmHg. The mean mitral valve gradient is 3.0 mmHg. Tricuspid Valve: The tricuspid valve is normal in structure. Tricuspid valve regurgitation is mild. Aortic Valve: The aortic valve is normal in structure. Aortic valve regurgitation is mild. Aortic regurgitation PHT measures 603 msec. Aortic valve sclerosis is present, with no evidence of aortic valve stenosis. Aortic valve peak gradient measures 12.5  mmHg. Pulmonic Valve: The pulmonic valve was not well visualized. Pulmonic valve regurgitation is not visualized. No evidence of pulmonic stenosis. Aorta: The aortic root and ascending aorta are structurally normal, with no evidence of dilitation. IAS/Shunts: No atrial level shunt detected by color flow Doppler.  LEFT VENTRICLE PLAX 2D LVIDd:         4.20 cm     Diastology LVIDs:         2.90 cm     LV e' medial:    8.05 cm/s LV PW:         1.10 cm     LV E/e' medial:  13.2 LV IVS:        1.00 cm     LV e' lateral:   6.09 cm/s LVOT diam:     1.80 cm     LV E/e' lateral: 17.4 LV SV:         39 LV SV Index:   25 LVOT Area:     2.54 cm  LV Volumes (MOD) LV vol d, MOD A2C: 38.3 ml LV vol d, MOD A4C: 41.6 ml LV vol s, MOD A2C: 8.4 ml LV vol s, MOD A4C: 9.8 ml LV SV MOD A2C:     29.9 ml LV SV MOD A4C:     41.6 ml LV SV MOD BP:      31.6 ml RIGHT VENTRICLE RV Basal diam:  2.40 cm RV S prime:     10.90 cm/s TAPSE (M-mode): 1.6 cm LEFT ATRIUM             Index        RIGHT ATRIUM           Index LA diam:  4.10 cm 2.56 cm/m   RA Area:     16.50 cm LA Vol (A2C):   84.0 ml 52.43 ml/m  RA Volume:   35.60 ml  22.22 ml/m LA Vol (A4C):   76.2 ml 47.56 ml/m LA Biplane Vol: 87.3 ml 54.49 ml/m  AORTIC VALVE                 PULMONIC VALVE AV Area (Vmax): 1.03 cm     PV Vmax:       0.92 m/s AV Vmax:        177.00 cm/s  PV Peak grad:  3.4 mmHg AV Peak Grad:   12.5 mmHg LVOT Vmax:      71.80 cm/s LVOT Vmean:     49.900 cm/s LVOT VTI:       0.155 m AI PHT:         603 msec  AORTA Ao Root diam: 3.10 cm Ao Asc diam:  3.40 cm MITRAL VALVE                TRICUSPID VALVE MV Area (PHT): 3.46 cm     TV Peak grad:   17.8 mmHg MV Area VTI:   0.99 cm     TV Vmax:        2.11 m/s MV Peak grad:  8.3 mmHg MV Mean grad:  3.0 mmHg     SHUNTS MV Vmax:       1.44 m/s     Systemic VTI:  0.16 m MV Vmean:      70.6 cm/s    Systemic Diam: 1.80 cm MV Decel Time: 219 msec MV E velocity: 106.00 cm/s MV A velocity: 48.40 cm/s MV E/A ratio:  2.19  Donnelly Angelica Electronically signed by Donnelly Angelica Signature Date/Time: 04/26/2021/12:32:09 PM    Final     ASSESSMENT: Colon cancer.  PLAN:    1.  Colon cancer: Given patient's advanced age, if chemotherapy is necessary, would unlikely recommend.  Have recommended patient undergo surgical resection and a referral has been sent to surgery for evaluation.  Will get staging CT scans prior to surgical appointment.  Preop CEA is within normal limits.  Return to clinic postoperatively in approximately 4 to 6 weeks. 2.  Anemia: Patient's most recent hemoglobin is 9.8, monitor.  Surgical referral as above. 3.  Leukocytosis: Likely reactive, monitor.  I spent a total of 60 minutes reviewing chart data, face-to-face evaluation with the patient, counseling and coordination of care as detailed above.   Patient expressed understanding and was in agreement with this plan. She also understands that She can call clinic at any time with any questions, concerns, or complaints.    Cancer Staging  No matching staging information was found for the patient.  Lloyd Huger, MD   04/29/2021 10:21 AM

## 2021-04-29 NOTE — Progress Notes (Signed)
Patient here for initial visit she was recently hospitalized for generalized weakness. While in hospital the cecal mass was diangnosed.

## 2021-05-04 DIAGNOSIS — C189 Malignant neoplasm of colon, unspecified: Secondary | ICD-10-CM | POA: Insufficient documentation

## 2021-05-04 HISTORY — DX: Malignant neoplasm of colon, unspecified: C18.9

## 2021-05-06 ENCOUNTER — Inpatient Hospital Stay: Payer: Medicare Other

## 2021-05-06 NOTE — Progress Notes (Signed)
Tumor Board Documentation  [name] was presented by Dr Grayland Ormond at our Tumor Board on 05/06/2021, which included representatives from medical oncology, radiation oncology, pathology, radiology, pulmonology, genetics, research, navigation, palliative care, internal medicine.  [name] currently presents as a new patient, for [name], for new positive pathology with history of the following treatments: active survellience, surgical intervention(s).  Additionally, we reviewed previous medical and familial history, history of present illness, and recent lab results along with all available histopathologic and imaging studies. The tumor board considered available treatment options and made the following recommendations: Surgery Possible Chemo after surgery  The following procedures/referrals were also placed: No orders of the defined types were placed in this encounter.   Clinical Trial Status: not discussed   Staging used: AJCC Stage Group, To be determined AJCC Staging: T: 4b     Group: Adenocarcinoma of colon Stage 2- 3 TBD   National site-specific guidelines NCCN were discussed with respect to the case.  Tumor board is a meeting of clinicians from various specialty areas who evaluate and discuss patients for whom a multidisciplinary approach is being considered. Final determinations in the plan of care are those of the provider(s). The responsibility for follow up of recommendations given during tumor board is that of the provider.   Todays extended care, comprehensive team conference, [name] was not present for the discussion and was not examined.   Multidisciplinary Tumor Board is a multidisciplinary case peer review process.  Decisions discussed in the Multidisciplinary Tumor Board reflect the opinions of the specialists present at the conference without having examined the patient.  Ultimately, treatment and diagnostic decisions rest with the primary provider(s) and the  patient.

## 2021-05-07 ENCOUNTER — Telehealth: Payer: Self-pay | Admitting: Primary Care

## 2021-05-07 NOTE — Telephone Encounter (Signed)
Spoke with patient regarding the Palliative referral and explained what Palliative services were and she wanted to investigate this a little more and talk with someone about it before agreeing to schedule visit.  She took my name and number and will call me back to let me know what she has decided to do.

## 2021-06-04 ENCOUNTER — Telehealth: Payer: Self-pay | Admitting: Primary Care

## 2021-06-04 NOTE — Telephone Encounter (Signed)
Spoke with patient's nephew [name] and explained to him that I was calling from [place] and that I had talked with patient a couple of weeks ago and she was supposed to call me back but she never did.  Nephew asked if I wanted to speak with patient, he was there at her house and I said yes.  Discussed with patient what our Palliative services were and she seemed very confused but she was in agreement with scheduling a visit with the NP.  An in-home Palliative consult was scheduled for 06/08/21 @ 12:15 PM

## 2021-06-08 ENCOUNTER — Other Ambulatory Visit: Payer: Medicare Other | Admitting: Primary Care

## 2021-06-08 ENCOUNTER — Other Ambulatory Visit: Payer: Self-pay

## 2021-06-08 ENCOUNTER — Other Ambulatory Visit: Payer: Medicare Other

## 2021-06-08 DIAGNOSIS — Z515 Encounter for palliative care: Secondary | ICD-10-CM

## 2021-06-08 NOTE — Progress Notes (Signed)
PATIENT NAME: [name] DOB: Mar 25, 1925 MRN: [id number]  PRIMARY CARE PROVIDER: [facility]  RESPONSIBLE PARTY:  Acct ID - Guarantor Home Phone Work Phone Relationship Acct Type  [id number] JANINE, [phone]  Self P/F     [address], [place], [place] [address]    Arrived at patient's home and greeted at the door by nephew Toney Reil and [employer].  Patient found in the living room in her recliner chair.  I introduced myself as a nurse from [place] and patient asked me to leave and states services are not needed.  Cathy asked that I explain services and I did but patient states she will contact us if services are needed at a later time.   Visit not completed as patient declines services.  131 pm.  Incoming call from [employer].  She apologizes for patient's behavior and states she has never seen the patient act in this manner.  Advised that if services are needed at a later date, we would see patient with referral from PCP.   229 pm. Message from [facility], SW requesting I contact patient as she is trying to reach me.  Return call made to patient and she apologizes for how she spoke to me and is asking to have Palliative Care see her.  Patient advises she does not wish to have her neighbor involved in this visit and was not sure any other way to handle the situation.  Visit is scheduled with Ralene Bathe, NP on 2/21 @ 215 pm.  Updated Ralene Bathe, NP.     Lorenza Burton, RN

## 2021-06-15 ENCOUNTER — Other Ambulatory Visit: Payer: Medicare Other | Admitting: Primary Care

## 2021-06-15 ENCOUNTER — Other Ambulatory Visit: Payer: Self-pay

## 2021-06-15 ENCOUNTER — Encounter: Payer: Self-pay | Admitting: Primary Care

## 2021-06-15 DIAGNOSIS — E876 Hypokalemia: Secondary | ICD-10-CM | POA: Insufficient documentation

## 2021-06-15 DIAGNOSIS — G8929 Other chronic pain: Secondary | ICD-10-CM

## 2021-06-15 DIAGNOSIS — R1030 Lower abdominal pain, unspecified: Secondary | ICD-10-CM

## 2021-06-15 DIAGNOSIS — C189 Malignant neoplasm of colon, unspecified: Secondary | ICD-10-CM

## 2021-06-15 DIAGNOSIS — M545 Low back pain, unspecified: Secondary | ICD-10-CM

## 2021-06-15 DIAGNOSIS — R109 Unspecified abdominal pain: Secondary | ICD-10-CM | POA: Insufficient documentation

## 2021-06-15 DIAGNOSIS — E213 Hyperparathyroidism, unspecified: Secondary | ICD-10-CM | POA: Insufficient documentation

## 2021-06-15 NOTE — Progress Notes (Signed)
Therapist, nutritional Palliative Care Consult Note Telephone: [phone]  Fax: [phone]   Date of encounter: 06/15/21 5:18 PM PATIENT NAME: [name] [address] [place] [address]   [phone] (home)  DOB: 1925-03-31 MRN: [id number] PRIMARY CARE PROVIDER:    [facility],  [address] [place] [address] [phone]  REFERRING PROVIDER:   [facility] [address] [place],  [place] [address] [phone]  RESPONSIBLE PARTY:    Contact Information     Name Relation Home Work [place]   [phone]   saliga,cathy Neighbor   [phone]       I met face to face with patient and family in  home. Palliative Care was asked to follow this patient by consultation request of  [place] to address advance care planning and complex medical decision making. This is the initial visit.                                     ASSESSMENT AND PLAN / RECOMMENDATIONS:   Advance Care Planning/Goals of Care: Goals include to maximize quality of life and symptom management. Patient/health care surrogate gave his/her permission to discuss.Our advance care planning conversation included a discussion about:    The value and importance of advance care planning  Experiences with loved ones who have been seriously ill or have died  Exploration of personal, cultural or spiritual beliefs that might influence medical decisions  Exploration of goals of care in the event of a sudden injury or illness  Identification of a healthcare agent  Review f an  advance directive document . CODE STATUS: FULL I met with patient and her nephew in her home. She was alert and oriented times three and a fairly good  historian. She recounts that she has lived a long life and recently was told she has cancer in her abdomen. She states this is troubling and bothersome to her. She outlined that the doctors  didn't want anything to do with me. We discussed options which were not yet offered due to disease stage and patient age and frailty. There is a note of pending surgical consult but she is not aware of an appointment.  We discussed the concept of not treating the disease if the treatments could possibly make her worse. She appears to have confidence in and  good rapport with her primary provider. It appears she would like some clarification on treatments although she thinks there isn't much that she can do.  She is currently not having any pain. She did endorse occasional constipation and we discussed some over-the-counter remedies. She says it is not a daily problem.    Her appetite is fair and she appears to have low weight and is cachectic. She lives in her own home with two dogs and her nephew who is there a lot of the time but keeps a separate residence close by. She outlines that she and he are the only family left. She had seven brothers and sisters and he is the son of her deceased sister.  He states he is a Cytogeneticist of 20 years Visual merchandiser; patient states that it's somewhat difficult to keep her home up but that her nephew does help her. She is a widow of a Training and development officer. She did not have children and traveled with her  husband during his Assignments around the world. We discussed advance care planning. She does not have in a power of attorney. We discussed it could be her nephew or a friend but someone who would uphold her care decisions. I gave her the Five Wishes booklet as well as the MOST form for  them to review and we can discuss on our next visit. She is open to having PC in addition to PCP following her. She expects to maintain a relationship with a primary care provider that she has, Thornton Dales.  Follow up Palliative Care Visit: Palliative care will continue to follow for complex medical decision making, advance care planning, and clarification of goals. Return 8 weeks or  prn.  I spent 60 minutes providing this consultation. More than 50% of the time in this consultation was spent in counseling and care coordination.  PPS: 40%  HOSPICE ELIGIBILITY/DIAGNOSIS: TBD  Chief Complaint: colon cancer diagnosis  HISTORY OF PRESENT ILLNESS:  Ajooni Karam is a 88 y.o. year old female  with h/o recent colon cancer dx with metastatic disease to bladder, ovary and uterus. She is currently still trying to decide about any treatments but feels there was not follow up. She has a nephew who helps with her care but he may also need medical assistance, per patient. There are no other family. She states her goals are living as long as possible .   History obtained from review of EMR, discussion with primary team, and interview with family, facility staff/caregiver and/or [name].  I reviewed available labs, medications, imaging, studies and related documents from the EMR.  Records reviewed and summarized above.   ROS   General: NAD ENMT: denies dysphagia Pulmonary: denies cough, denies increased SOB Abdomen: endorses good appetite, endorses constipation, endorses continence of bowel GU: denies dysuria, endorses continence of urine MSK:  endorses  increased weakness,  no falls reported Skin: denies rashes or wounds Neurological: denies pain, denies insomnia Psych: Endorses positive mood Heme/lymph/immuno: denies bruises, abnormal bleeding  Physical Exam: Current and past weights: 132 lbs ( 144 lbs a year ago) Constitutional: NAD General: frail appearing, thin/ EYES: anicteric sclera, lids intact, no discharge  ENMT: intact hearing, oral mucous membranes moist, dentition intact CV: no LE edema Pulmonary: no increased work of breathing, no cough, room air Abdomen: intake 50%,  no ascites GU: deferred MSK: + sarcopenia, moves all extremities, ambulatory with walker  Skin: warm and dry, no rashes or wounds on visible skin Neuro:  +generalized weakness,   mild  cognitive impairment Psych: slight anxious affect, A and O x 2-3, forgetful with details Hem/lymph/immuno: no widespread bruising CURRENT PROBLEM LIST:  Patient Active Problem List   Diagnosis Date Noted   Abdominal pain 06/15/2021   Hyperparathyroidism ([place]) 06/15/2021   Hypokalemia 06/15/2021   Colon cancer metastasized to multiple sites ([place]) 05/04/2021   Acute GI bleeding 04/23/2021   HTN (hypertension) 04/23/2021   Asthma 04/23/2021   TIA (transient ischemic attack) 04/23/2021   Atrial fibrillation, chronic ([place]) 04/23/2021   Acute blood loss anemia 04/23/2021   Iron deficiency anemia 04/23/2021   Hyponatremia    Symptomatic anemia    Closed left hip fracture ([place]) 09/15/2020   Acquired thrombophilia ([place]) 12/18/2019   Acute appendicitis 07/25/2019   Chronic right-sided low back pain 07/06/2019   Tinnitus of both ears 07/06/2019   Leukocytosis 06/29/2016   Hypogammaglobulinemia ([place]) 06/29/2016   Temporary cerebral vascular dysfunction 02/28/2014   DD (diverticular disease) 02/28/2014   Allergic rhinitis  02/28/2014   Allergy status to sulfonamides 02/28/2014   History of anticoagulant therapy 02/28/2014   Arthritis 02/28/2014   Airway hyperreactivity 02/28/2014   A-fib ([place]) 02/28/2014   Bilateral cataracts 02/28/2014   Dermatitis, eczematoid 02/28/2014   Allergy to environmental factors 02/28/2014   Acid reflux 02/28/2014   Polypharmacy 02/28/2014   HLD (hyperlipidemia) 02/28/2014   Adult hypothyroidism 02/28/2014   BP (high blood pressure) 02/28/2014   Arthritis, degenerative 02/28/2014   Pseudogout 12/11/2013   Degenerative arthritis of hip 10/09/2013   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   Neck rigid 05/30/2013   1st degree AV block 10/10/2012   History of biliary T-tube placement 06/14/2012   History of cardioversion 06/05/2012   Avascular necrosis of femoral head ([place]) 06/01/2012   Lumbar radiculopathy 06/01/2012   DDD  (degenerative disc disease), lumbar 06/01/2012   H/O cardiac catheterization 10/20/2009   PAST MEDICAL HISTORY:  Active Ambulatory Problems    Diagnosis Date Noted   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   Temporary cerebral vascular dysfunction 02/28/2014   Neck rigid 05/30/2013   DD (diverticular disease) 02/28/2014   Allergic rhinitis 02/28/2014   Allergy status to sulfonamides 02/28/2014   History of anticoagulant therapy 02/28/2014   Arthritis 02/28/2014   Airway hyperreactivity 02/28/2014   A-fib ([place]) 02/28/2014   Avascular necrosis of femoral head ([place]) 06/01/2012   Bilateral cataracts 02/28/2014   Lumbar radiculopathy 06/01/2012   DDD (degenerative disc disease), lumbar 06/01/2012   Dermatitis, eczematoid 02/28/2014   Allergy to environmental factors 02/28/2014   1st degree AV block 10/10/2012   Acid reflux 02/28/2014   H/O cardiac catheterization 10/20/2009   History of biliary T-tube placement 06/14/2012   Polypharmacy 02/28/2014   History of cardioversion 06/05/2012   HLD (hyperlipidemia) 02/28/2014   Adult hypothyroidism 02/28/2014   BP (high blood pressure) 02/28/2014   Degenerative arthritis of hip 10/09/2013   Arthritis, degenerative 02/28/2014   Leukocytosis 06/29/2016   Hypogammaglobulinemia ([place]) 06/29/2016   Acute appendicitis 07/25/2019   Closed left hip fracture ([place]) 09/15/2020   Acute GI bleeding 04/23/2021   HTN (hypertension) 04/23/2021   Asthma 04/23/2021   TIA (transient ischemic attack) 04/23/2021   Atrial fibrillation, chronic ([place]) 04/23/2021   Acute blood loss anemia 04/23/2021   Iron deficiency anemia 04/23/2021   Hyponatremia    Symptomatic anemia    Abdominal pain 06/15/2021   Acquired thrombophilia ([place]) 12/18/2019   Pseudogout 12/11/2013   Chronic right-sided low back pain 07/06/2019   Colon cancer metastasized to multiple sites ([place]) 05/04/2021   Hyperparathyroidism ([place]) 06/15/2021   Hypokalemia  06/15/2021   Tinnitus of both ears 07/06/2019   Resolved Ambulatory Problems    Diagnosis Date Noted   No Resolved Ambulatory Problems   Past Medical History:  Diagnosis Date   Atrial fibrillation (HCC)    Cataract    Eczema    GERD (gastroesophageal reflux disease)    Gout of wrist    Heart murmur    Pacemaker    Pseudogout of ankle or foot    Thyroid disease    SOCIAL HX:  Social History   Tobacco Use   Smoking status: Never   Smokeless tobacco: Never  Substance Use Topics   Alcohol use: Not Currently    Alcohol/week: 0.0 standard drinks   FAMILY HX:  Family History  Problem Relation Age of Onset   Leukemia Father    Lung cancer Sister    Breast cancer Neg Hx  ALLERGIES:  Allergies  Allergen Reactions   Fluticasone-Salmeterol Anaphylaxis   Albuterol     Other reaction(s): Other (See Comments) It burns and makes her cough "too strong for me or something"   Ciprofloxacin Other (See Comments) and Hives    Other reaction(s): Unknown Does not remember   Codeine Hives   Colchicine Diarrhea   Other Other (See Comments)   Promethazine Other (See Comments)    Unable to speak. Loss muscle control ([place])   Valacyclovir     Other reaction(s): Muscle Pain Could hardly move   Guaifenesin Palpitations   Sulfa Antibiotics Rash and Hives     PERTINENT MEDICATIONS:  Outpatient Encounter Medications as of 06/15/2021  Medication Sig   acetaminophen (TYLENOL) 650 MG CR tablet Take 650 mg by mouth every 8 (eight) hours as needed for pain.   atorvastatin (LIPITOR) 40 MG tablet Take 40 mg by mouth every evening.    budesonide-formoterol (SYMBICORT) 80-4.5 MCG/ACT inhaler Inhale 2 puffs into the lungs 2 (two) times daily.    Cholecalciferol (VITAMIN D3) 50 MCG (2000 UT) TABS Take 2,000 Units by mouth daily.    diclofenac Sodium (VOLTAREN) 1 % GEL Apply 2 g topically daily as needed (pain).    docusate sodium (COLACE) 100 MG capsule Take 1 capsule (100 mg total) by  mouth 2 (two) times daily.   famotidine (PEPCID) 20 MG tablet Take 20 mg by mouth 2 (two) times daily.    ferrous sulfate 325 (65 FE) MG tablet Take 1 tablet (325 mg total) by mouth 2 (two) times daily with a meal. (Patient not taking: Reported on 04/29/2021)   levothyroxine (SYNTHROID, LEVOTHROID) 112 MCG tablet Take 112 mcg by mouth daily before breakfast.   montelukast (SINGULAIR) 10 MG tablet Take 10 mg by mouth at bedtime.    Olopatadine HCl 0.7 % SOLN Apply 1 drop to eye at bedtime.    Omega-3 1000 MG CAPS Take 1,000 mg by mouth daily.    Skin Protectants, Misc. (EUCERIN) cream Apply topically as needed for dry skin. Apply to tops of feet   No facility-administered encounter medications on file as of 06/15/2021.   Thank you for the opportunity to participate in the care of Ms. Robicheaux.  The palliative care team will continue to follow. Please call our office at [phone] if we can be of additional assistance.   Jason Coop, NP , DNP, AGPCNP-BC  COVID-19 PATIENT SCREENING TOOL Asked and negative response unless otherwise noted:  Have you had symptoms of covid, tested positive or been in contact with someone with symptoms/positive test in the past 5-10 days?

## 2021-08-07 ENCOUNTER — Ambulatory Visit
Admission: EM | Admit: 2021-08-07 | Discharge: 2021-08-07 | Disposition: A | Discharge status: Home / Self Care | Payer: Medicare Other | Attending: Internal Medicine | Admitting: Internal Medicine

## 2021-08-07 DIAGNOSIS — H1010 Acute atopic conjunctivitis, unspecified eye: Secondary | ICD-10-CM | POA: Diagnosis not present

## 2021-08-07 MED ORDER — AZELASTINE HCL 0.05 % OP SOLN: 1.0000 [drp] | Freq: Two times a day (BID) | OPHTHALMIC | 0 refills | Status: DC

## 2021-08-07 NOTE — Discharge Instructions (Addendum)
You may also take Claritin for allergies which will help your symptoms along with the drops ?

## 2021-08-07 NOTE — ED Provider Notes (Signed)
?[place] ? ? ? ?CSN: [id number] ?Arrival date & time: 08/07/21  1310 ? ? ?  ? ?History   ?Chief Complaint ?Chief Complaint  ?Patient presents with  ? Eye Problem  ? ? ?HPI ?[name] is a 88 y.o. female who presents with bilateral eye itching and tearing x 2-3 days. Her allergies have been acting up, but has not been taking any meds for this. This am she felt she has rocks under her upper lids, worse on the R. She denies feeling something got in her eye when outside, but is out with her dogs a lot. Denies photophobia or vision changes.  ? ? ? ?Past Medical History:  ?Diagnosis Date  ? Arthritis   ? Asthma   ? Atrial fibrillation ([place])   ? Cataract   ? Colon cancer metastasized to multiple sites [facility]) 05/04/2021  ? Eczema   ? GERD (gastroesophageal reflux disease)   ? Gout of wrist   ? Heart murmur   ? Pacemaker   ? Pseudogout of ankle or foot   ? Thyroid disease   ? TIA (transient ischemic attack)   ? ? ?Patient Active Problem List  ? Diagnosis Date Noted  ? Abdominal pain 06/15/2021  ? Hyperparathyroidism ([place]) 06/15/2021  ? Hypokalemia 06/15/2021  ? Colon cancer metastasized to multiple sites [facility]) 05/04/2021  ? Acute GI bleeding 04/23/2021  ? HTN (hypertension) 04/23/2021  ? Asthma 04/23/2021  ? TIA (transient ischemic attack) 04/23/2021  ? Atrial fibrillation, chronic ([place]) 04/23/2021  ? Acute blood loss anemia 04/23/2021  ? Iron deficiency anemia 04/23/2021  ? Hyponatremia   ? Symptomatic anemia   ? Closed left hip fracture ([place]) 09/15/2020  ? Acquired thrombophilia ([place]) 12/18/2019  ? Acute appendicitis 07/25/2019  ? Chronic right-sided low back pain 07/06/2019  ? Tinnitus of both ears 07/06/2019  ? Leukocytosis 06/29/2016  ? Hypogammaglobulinemia ([place]) 06/29/2016  ? Temporary cerebral vascular dysfunction 02/28/2014  ? DD (diverticular disease) 02/28/2014  ? Allergic rhinitis 02/28/2014  ? Allergy status to sulfonamides 02/28/2014  ? History of anticoagulant therapy 02/28/2014  ?  Arthritis 02/28/2014  ? Airway hyperreactivity 02/28/2014  ? A-fib ([place]) 02/28/2014  ? Bilateral cataracts 02/28/2014  ? Dermatitis, eczematoid 02/28/2014  ? Allergy to environmental factors 02/28/2014  ? Acid reflux 02/28/2014  ? Polypharmacy 02/28/2014  ? HLD (hyperlipidemia) 02/28/2014  ? Adult hypothyroidism 02/28/2014  ? BP (high blood pressure) 02/28/2014  ? Arthritis, degenerative 02/28/2014  ? Pseudogout 12/11/2013  ? Degenerative arthritis of hip 10/09/2013  ? 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 Neck rigid 05/30/2013  ? 1st degree AV block 10/10/2012  ? History of biliary T-tube placement 06/14/2012  ? History of cardioversion 06/05/2012  ? Avascular necrosis of femoral head ([place]) 06/01/2012  ? Lumbar radiculopathy 06/01/2012  ? DDD (degenerative disc disease), lumbar 06/01/2012  ? H/O cardiac catheterization 10/20/2009  ? ? ?Past Surgical History:  ?Procedure Laterality Date  ? ABLATION    ? COLONOSCOPY N/A 04/26/2021  ? Procedure: COLONOSCOPY;  Surgeon: Toledo, Benay Pike, MD;  Location: [facility] ENDOSCOPY;  Service: Gastroenterology;  Laterality: N/A;  ? COLONOSCOPY N/A 04/27/2021  ? Procedure: COLONOSCOPY;  Surgeon: Toledo, Benay Pike, MD;  Location: [facility] ENDOSCOPY;  Service: Gastroenterology;  Laterality: N/A;  ? ESOPHAGOGASTRODUODENOSCOPY N/A 04/26/2021  ? Procedure: ESOPHAGOGASTRODUODENOSCOPY (EGD);  Surgeon: Toledo, Benay Pike, MD;  Location: [facility] ENDOSCOPY;  Service: Gastroenterology;  Laterality: N/A;  ? EYE SURGERY Bilateral   ? cataract  ? foot sugery    ?  HIP ARTHROPLASTY    ? right  ? IMPLANTABLE CARDIOVERTER DEFIBRILLATOR (ICD) GENERATOR CHANGE Left 05/29/2019  ? Procedure: PACEMAKER CHANGE OUT;  Surgeon: Isaias Cowman, MD;  Location: [facility] ORS;  Service: Cardiovascular;  Laterality: Left;  ? INTRAMEDULLARY (IM) NAIL INTERTROCHANTERIC Left 09/16/2020  ? Procedure: INTRAMEDULLARY (IM) NAIL INTERTROCHANTRIC;  Surgeon: Renee Harder, MD;  Location: [facility] ORS;  Service:  Orthopedics;  Laterality: Left;  ? PACEMAKER INSERTION    ? ? ?OB History   ?No obstetric history on file. ?  ? ? ? ?Home Medications   ? ?Prior to Admission medications   ?Medication Sig Start Date End Date Taking? Authorizing Provider  ?acetaminophen (TYLENOL) 650 MG CR tablet Take 650 mg by mouth every 8 (eight) hours as needed for pain.   Yes [provider]  ?atorvastatin (LIPITOR) 40 MG tablet Take 40 mg by mouth every evening.    Yes [provider]  ?azelastine (OPTIVAR) 0.05 % ophthalmic solution Place 1 drop into both eyes 2 (two) times daily. For 7 days 08/07/21  Yes Rodriguez-Southworth, Sunday Spillers, PA-C  ?budesonide-formoterol (SYMBICORT) 80-4.5 MCG/ACT inhaler Inhale 2 puffs into the lungs 2 (two) times daily.  09/09/13  Yes [provider]  ?Cholecalciferol (VITAMIN D3) 50 MCG (2000 UT) TABS Take 2,000 Units by mouth daily.    Yes [provider]  ?diclofenac Sodium (VOLTAREN) 1 % GEL Apply 2 g topically daily as needed (pain).  08/22/13  Yes [provider]  ?docusate sodium (COLACE) 100 MG capsule Take 1 capsule (100 mg total) by mouth 2 (two) times daily. 09/19/20  Yes Bonnielee Haff, MD  ?famotidine (PEPCID) 20 MG tablet Take 20 mg by mouth 2 (two) times daily.    Yes [provider]  ?ferrous sulfate 325 (65 FE) MG tablet Take 1 tablet (325 mg total) by mouth 2 (two) times daily with a meal. 04/27/21  Yes Fritzi Mandes, MD  ?levothyroxine (SYNTHROID, LEVOTHROID) 112 MCG tablet Take 112 mcg by mouth daily before breakfast.   Yes [provider]  ?montelukast (SINGULAIR) 10 MG tablet Take 10 mg by mouth at bedtime.  12/27/13  Yes [provider]  ?Olopatadine HCl 0.7 % SOLN Apply 1 drop to eye at bedtime.    Yes [provider]  ?Omega-3 1000 MG CAPS Take 1,000 mg by mouth daily.    Yes [provider]  ?Skin Protectants, Misc. (EUCERIN) cream Apply topically as needed for dry skin. Apply to tops of feet 07/07/20  Yes  Cuthriell, Charline Bills, PA-C  ? ? ?Family History ?Family History  ?Problem Relation Age of Onset  ? Leukemia Father   ? Lung cancer Sister   ? Breast cancer Neg Hx   ? ? ?Social History ?Social History  ? ?Tobacco Use  ? Smoking status: Never  ? Smokeless tobacco: Never  ?Vaping Use  ? Vaping Use: Never used  ?Substance Use Topics  ? Alcohol use: Not Currently  ?  Alcohol/week: 0.0 standard drinks  ? Drug use: No  ? ? ? ?Allergies   ?Fluticasone-salmeterol, Albuterol, Ciprofloxacin, Codeine, Colchicine, Other, Promethazine, Valacyclovir, Guaifenesin, and Sulfa antibiotics ? ? ?Review of Systems ?Review of Systems  ?Constitutional:  Negative for fever.  ?HENT:  Positive for congestion, rhinorrhea and sneezing.   ?Eyes:  Positive for redness and itching. Negative for photophobia, pain, discharge and visual disturbance.  ?Respiratory:  Negative for cough.   ?Skin:  Negative for rash.  ? ? ?Physical Exam ?Triage Vital Signs ?ED Triage Vitals  ?Enc  Vitals Group  ?   BP 08/07/21 1336 125/77  ?   Pulse Rate 08/07/21 1336 65  ?   Resp 08/07/21 1336 18  ?   Temp 08/07/21 1336 98 ?F (36.7 ?C)  ?   Temp Source 08/07/21 1336 Oral  ?   SpO2 08/07/21 1336 96 %  ?   Weight 08/07/21 1334 125 lb (56.7 kg)  ?   Height 08/07/21 1334 '5\' 4"'$  (1.626 m)  ?   Head Circumference --   ?   Peak Flow --   ?   Pain Score 08/07/21 1333 10  ?   Pain Loc --   ?   Pain Edu? --   ?   Excl. in [place]? --   ? ?No data found. ? ?Updated Vital Signs ?BP 125/77 (BP Location: Left Arm)   Pulse 65   Temp 98 ?F (36.7 ?C) (Oral)   Resp 18   Ht '5\' 4"'$  (1.626 m)   Wt 125 lb (56.7 kg)   SpO2 96%   BMI 21.46 kg/m?  ? ?Visual Acuity ?Right Eye Distance:   ?Left Eye Distance:   ?Bilateral Distance:   ? ?Right Eye Near:   ?Left Eye Near:    ?Bilateral Near:    ? ?Physical Exam ?Vitals and nursing note reviewed.  ?Constitutional:   ?   General: She is not in acute distress. ?   Appearance: She is normal weight. She is not toxic-appearing.  ?HENT:  ?   Right Ear:  External ear normal.  ?   Left Ear: External ear normal.  ?Eyes:  ?   General: Lids are everted, no foreign bodies appreciated. Allergic shiner present. No scleral icterus.    ?   Right eye: No discharge.     ?   Left eye: N

## 2021-08-07 NOTE — ED Triage Notes (Signed)
Pt c/o bilateral eye swelling and drainage x2-3days.  Pt states that it felt like a rock was in her right eye.  ?

## 2021-08-10 ENCOUNTER — Telehealth: Payer: Self-pay

## 2021-08-10 ENCOUNTER — Other Ambulatory Visit: Payer: Medicare Other | Admitting: Primary Care

## 2021-08-10 NOTE — Telephone Encounter (Signed)
810 am.  Message received from nephew-Fritz requesting a call back.  Return call made and [name] advised he and patient are flying to [place] this morning and will need to cancel the appointment with Ralene Bathe, NP today.  He has requested a call back in 1-2 weeks to reschedule.  Update provided to Ralene Bathe, NP.  ?

## 2021-11-25 ENCOUNTER — Telehealth: Payer: Self-pay

## 2021-11-25 NOTE — Telephone Encounter (Signed)
Spoke with Sharyn Lull at [facility] and patient is not an active patient with them.  She is followed by [facility].  Phone call made to [facility].  Message to be given to [employer].

## 2021-11-25 NOTE — Telephone Encounter (Signed)
950 am.  Phone call made to patient to schedule a home visit with [facility] NP.  Spoke with Glendell Docker who advised services were no longer needed.  Updated Ralene Bathe, NP.  Phone call made to PCP office to advise of discharge from Palliative Care.

## 2022-01-26 ENCOUNTER — Ambulatory Visit
Admission: EM | Admit: 2022-01-26 | Discharge: 2022-01-26 | Disposition: A | Discharge status: Home / Self Care | Payer: Medicare Other | Attending: Family Medicine | Admitting: Family Medicine

## 2022-01-26 DIAGNOSIS — N3001 Acute cystitis with hematuria: Secondary | ICD-10-CM | POA: Insufficient documentation

## 2022-01-26 DIAGNOSIS — L259 Unspecified contact dermatitis, unspecified cause: Secondary | ICD-10-CM | POA: Insufficient documentation

## 2022-01-26 LAB — URINALYSIS, ROUTINE W REFLEX MICROSCOPIC
Bilirubin Urine: NEGATIVE
Glucose, UA: NEGATIVE mg/dL
Ketones, ur: NEGATIVE mg/dL
Nitrite: POSITIVE — AB
Protein, ur: >300 mg/dL — AB
Specific Gravity, Urine: 1.025 (ref 1.005–1.030)
pH: 6 (ref 5.0–8.0)

## 2022-01-26 LAB — URINALYSIS, MICROSCOPIC (REFLEX): WBC, UA: >50 WBC/hpf (ref 0–5)

## 2022-01-26 MED ORDER — CEFTRIAXONE SODIUM 1 G IJ SOLR
1.0000 g | Freq: Once | INTRAMUSCULAR | Status: AC
Start: 2022-01-26 — End: 2022-01-26
  Administered 2022-01-26: 1 g via INTRAMUSCULAR

## 2022-01-26 MED ORDER — DEXAMETHASONE SODIUM PHOSPHATE 10 MG/ML IJ SOLN: 0.6000 mg/kg | Freq: Once | INTRAMUSCULAR | Status: DC

## 2022-01-26 MED ORDER — DEXAMETHASONE SODIUM PHOSPHATE 10 MG/ML IJ SOLN
10.0000 mg | Freq: Once | INTRAMUSCULAR | Status: AC
  Administered 2022-01-26: 10 mg via INTRAMUSCULAR

## 2022-01-26 MED ORDER — CEPHALEXIN 500 MG PO CAPS: 500.0000 mg | ORAL_CAPSULE | Freq: Four times a day (QID) | ORAL | 0 refills | Status: AC

## 2022-01-26 MED ORDER — PREDNISONE 20 MG PO TABS: 20.0000 mg | ORAL_TABLET | Freq: Every day | ORAL | 0 refills | Status: AC

## 2022-01-26 NOTE — ED Provider Notes (Signed)
[facility]    CSN: [id number] Arrival date & time: 01/26/22  1548      History   Chief Complaint Chief Complaint  Patient presents with   Eye Problem   Urinary Frequency    HPI HPI  [name] is a 88 y.o. female.    [name] presents for bilateral eye irritation that started yesterday.  States that the discharge is coming from them has a sweet smell.  She noticed that she has to continuously wipe her eyes to get the discharge away.  Earlier today she had some blurry vision but this has resolved.  She does wear glasses.  She does not feel like something is in her eye.  Denies any trauma or anything getting into her eye.  She put some Neosporin around her eyes prior to arrival.  Denies headache, ear pain, nasal congestion, dental pain or sinus pain  [name] reports cloudy malodorous urine.  Has some urinary frequency.  Has not having any dysuria.  Denies current abdominal pain or new low back pain.  There has been no nausea or vomiting.   Past Medical History:  Diagnosis Date   Arthritis    Asthma    Atrial fibrillation ([place])    Cataract    Colon cancer metastasized to multiple sites ([place]) 05/04/2021   Eczema    GERD (gastroesophageal reflux disease)    Gout of wrist    Heart murmur    Pacemaker    Pseudogout of ankle or foot    Thyroid disease    TIA (transient ischemic attack)     Patient Active Problem List   Diagnosis Date Noted   Abdominal pain 06/15/2021   Hyperparathyroidism ([place]) 06/15/2021   Hypokalemia 06/15/2021   Colon cancer metastasized to multiple sites ([place]) 05/04/2021   Acute GI bleeding 04/23/2021   HTN (hypertension) 04/23/2021   Asthma 04/23/2021   TIA (transient ischemic attack) 04/23/2021   Atrial fibrillation, chronic ([place]) 04/23/2021   Acute blood loss anemia 04/23/2021   Iron deficiency anemia 04/23/2021   Hyponatremia    Symptomatic anemia    Closed left hip fracture ([place]) 09/15/2020   Acquired thrombophilia ([place])  12/18/2019   Acute appendicitis 07/25/2019   Chronic right-sided low back pain 07/06/2019   Tinnitus of both ears 07/06/2019   Leukocytosis 06/29/2016   Hypogammaglobulinemia ([place]) 06/29/2016   Temporary cerebral vascular dysfunction 02/28/2014   DD (diverticular disease) 02/28/2014   Allergic rhinitis 02/28/2014   Allergy status to sulfonamides 02/28/2014   History of anticoagulant therapy 02/28/2014   Arthritis 02/28/2014   Airway hyperreactivity 02/28/2014   A-fib ([place]) 02/28/2014   Bilateral cataracts 02/28/2014   Dermatitis, eczematoid 02/28/2014   Allergy to environmental factors 02/28/2014   Acid reflux 02/28/2014   Polypharmacy 02/28/2014   HLD (hyperlipidemia) 02/28/2014   Adult hypothyroidism 02/28/2014   BP (high blood pressure) 02/28/2014   Arthritis, degenerative 02/28/2014   Pseudogout 12/11/2013   Degenerative arthritis of hip 10/09/2013   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   Neck rigid 05/30/2013   1st degree AV block 10/10/2012   History of biliary T-tube placement 06/14/2012   History of cardioversion 06/05/2012   Avascular necrosis of femoral head ([place]) 06/01/2012   Lumbar radiculopathy 06/01/2012   DDD (degenerative disc disease), lumbar 06/01/2012   H/O cardiac catheterization 10/20/2009    Past Surgical History:  Procedure Laterality Date   ABLATION     COLONOSCOPY N/A 04/26/2021   Procedure: COLONOSCOPY;  Surgeon: Sedgwick, [place],  MD;  Location: [facility];  Service: Gastroenterology;  Laterality: N/A;   COLONOSCOPY N/A 04/27/2021   Procedure: COLONOSCOPY;  Surgeon: Toledo, Benay Pike, MD;  Location: [facility];  Service: Gastroenterology;  Laterality: N/A;   ESOPHAGOGASTRODUODENOSCOPY N/A 04/26/2021   Procedure: ESOPHAGOGASTRODUODENOSCOPY (EGD);  Surgeon: Toledo, Benay Pike, MD;  Location: [facility];  Service: Gastroenterology;  Laterality: N/A;   EYE SURGERY Bilateral    cataract   foot sugery     HIP  ARTHROPLASTY     right   IMPLANTABLE CARDIOVERTER DEFIBRILLATOR (ICD) GENERATOR CHANGE Left 05/29/2019   Procedure: PACEMAKER CHANGE OUT;  Surgeon: Isaias Cowman, MD;  Location: [facility];  Service: Cardiovascular;  Laterality: Left;   INTRAMEDULLARY (IM) NAIL INTERTROCHANTERIC Left 09/16/2020   Procedure: INTRAMEDULLARY (IM) NAIL INTERTROCHANTRIC;  Surgeon: Renee Harder, MD;  Location: [facility];  Service: Orthopedics;  Laterality: Left;   PACEMAKER INSERTION      OB History   No obstetric history on file.      Home Medications    Prior to Admission medications   Medication Sig Start Date End Date Taking? Authorizing Provider  cephALEXin (KEFLEX) 500 MG capsule Take 1 capsule (500 mg total) by mouth 4 (four) times daily for 5 days. 01/26/22 01/31/22 Yes Shivansh Hardaway, DO  predniSONE (DELTASONE) 20 MG tablet Take 1 tablet (20 mg total) by mouth daily for 10 days. 01/26/22 02/05/22 Yes Kenon Delashmit, DO  acetaminophen (TYLENOL) 650 MG CR tablet Take 650 mg by mouth every 8 (eight) hours as needed for pain.    [provider]  atorvastatin (LIPITOR) 40 MG tablet Take 40 mg by mouth every evening.     [provider]  azelastine (OPTIVAR) 0.05 % ophthalmic solution Place 1 drop into both eyes 2 (two) times daily. For 7 days 08/07/21   Rodriguez-Southworth, Sunday Spillers, PA-C  budesonide-formoterol (SYMBICORT) 80-4.5 MCG/ACT inhaler Inhale 2 puffs into the lungs 2 (two) times daily.  09/09/13   [provider]  Cholecalciferol (VITAMIN D3) 50 MCG (2000 UT) TABS Take 2,000 Units by mouth daily.     [provider]  diclofenac Sodium (VOLTAREN) 1 % GEL Apply 2 g topically daily as needed (pain).  08/22/13   [provider]  docusate sodium (COLACE) 100 MG capsule Take 1 capsule (100 mg total) by mouth 2 (two) times daily. 09/19/20   Bonnielee Haff, MD  famotidine (PEPCID) 20 MG tablet Take 20 mg by mouth 2 (two) times daily.     [provider]  ferrous sulfate 325 (65 FE) MG tablet Take 1 tablet (325 mg total) by mouth 2 (two) times daily with a meal. 04/27/21   Fritzi Mandes, MD  levothyroxine (SYNTHROID, LEVOTHROID) 112 MCG tablet Take 112 mcg by mouth daily before breakfast.    [provider]  montelukast (SINGULAIR) 10 MG tablet Take 10 mg by mouth at bedtime.  12/27/13   [provider]  Olopatadine HCl 0.7 % SOLN Apply 1 drop to eye at bedtime.     [provider]  Omega-3 1000 MG CAPS Take 1,000 mg by mouth daily.     [provider]  Skin Protectants, Misc. (EUCERIN) cream Apply topically as needed for dry skin. Apply to tops of feet 07/07/20   Cuthriell, Charline Bills, PA-C    Family History Family History  Problem Relation Age of Onset   Leukemia Father    Lung cancer Sister    Breast cancer Neg Hx     Social History Social History  Tobacco Use   Smoking status: Never   Smokeless tobacco: Never  Vaping Use   Vaping Use: Never used  Substance Use Topics   Alcohol use: Not Currently    Alcohol/week: 0.0 standard drinks of alcohol   Drug use: No     Allergies   Fluticasone-salmeterol, Albuterol, Ciprofloxacin, Codeine, Colchicine, Other, Promethazine, Valacyclovir, Guaifenesin, and Sulfa antibiotics   Review of Systems Review of Systems : negative unless otherwise stated in HPI.      Physical Exam Triage Vital Signs ED Triage Vitals  Enc Vitals Group     BP 01/26/22 1647 129/65     Pulse Rate 01/26/22 1647 64     Resp --      Temp 01/26/22 1647 98.6 F (37 C)     Temp Source 01/26/22 1647 Oral     SpO2 01/26/22 1647 97 %     Weight 01/26/22 1645 120 lb (54.4 kg)     Height 01/26/22 1645 '5\' 4"'$  (1.626 m)     Head Circumference --      Peak Flow --      Pain Score 01/26/22 1645 5     Pain Loc --      Pain Edu? --      Excl. in [place]? --    No data found.  Updated Vital Signs BP 129/65 (BP Location: Left Arm)   Pulse 64   Temp 98.6 F (37 C) (Oral)   Ht 5'  4" (1.626 m)   Wt 54.4 kg   SpO2 97%   BMI 20.60 kg/m   Visual Acuity Right Eye Distance:   Left Eye Distance:   Bilateral Distance:    Right Eye Near:   Left Eye Near:    Bilateral Near:     Physical Exam  GEN: pleasant elderly female, in no acute distress  NECK: normal ROM  CV: pacemaker on chest wall RESP: no increased work of breathing EYES:     General: Lids edematous and erythematous. No foreign bodies appreciated. Vision grossly intact. Gaze aligned appropriately.        Right eye: Serous discharge, no hordeolum    Left eye: No hordeolum, serous discharge.     Extraocular Movements: Extraocular movements intact.     Conjunctiva/sclera: Bilateral conjunctiva  injected. No chemosis or hemorrhage. ABD: Soft, nontender, no guarding no rebound SKIN: warm and dry   UC Treatments / Results  Labs (all labs ordered are listed, but only abnormal results are displayed) Labs Reviewed  URINALYSIS, ROUTINE W REFLEX MICROSCOPIC - Abnormal; Notable for the following components:      Result Value   APPearance TURBID (*)    Hgb urine dipstick MODERATE (*)    Protein, ur >300 (*)    Nitrite POSITIVE (*)    Leukocytes,Ua LARGE (*)    All other components within normal limits  URINALYSIS, MICROSCOPIC (REFLEX) - Abnormal; Notable for the following components:   Bacteria, UA MANY (*)    All other components within normal limits  URINE CULTURE    EKG   Radiology No results found.  Procedures Procedures (including critical care time)  Medications Ordered in UC Medications  cefTRIAXone (ROCEPHIN) injection 1 g (1 g Intramuscular Given 01/26/22 1747)  dexamethasone (DECADRON) injection 10 mg (10 mg Intramuscular Given 01/26/22 1748)    Initial Impression / Assessment and Plan / UC Course  I have reviewed the triage vital signs and the nursing notes.  Pertinent labs & imaging results that were available during  my care of the patient were reviewed by me and considered in my  medical decision making (see chart for details).     Patient is a 88 y.o. female  who presents for bilateral eye redness and pain.  On exam, she has erythema and edema with serous drainage bilaterally extending to her cheeks.  This is concerning for some kind of contact dermatitis versus facial cellulitis.  She had some initial blurry vision but this has resolved.  There are no foreign body seen.  She denies gardening recently.  She does have a dog at home that does go outside.  She is unsure if he got into any poisonous plants.  In the exam room, she has been consistently rubbing her eyes.  She also notes that she put Neosporin on them.  This may have caused her some irritation.  She was given a injection of ceftriaxone 1 g IM and 10 mg of Decadron IM today to cover her for possible infection or contact dermatitis.  Continue antibiotics as below and prescription for steroids sent to pharmacy.  Additionally, she has evidence of acute cystitis with hematuria on microscopy.  Treat with Keflex 4 times daily for 5 days.  Urine culture obtained.  Follow-up sensitivities and change antibiotics, if needed. Return precautions including abdominal pain, fever, chills, nausea, or vomiting given.   Discussed MDM, treatment plan and plan for follow-up with patient/parent who agrees with plan.  Final Clinical Impressions(s) / UC Diagnoses   Final diagnoses:  Contact dermatitis, unspecified contact dermatitis type, unspecified trigger  Acute cystitis with hematuria     Discharge Instructions      Stop by the pharmacy to pick up your prescriptions.  Follow up with your primary care provider as needed.  Go to ED for red flag symptoms, including; fevers you cannot reduce with Tylenol/Motrin, severe headaches, vision changes, numbness/weakness in part of the body, lethargy, confusion, intractable vomiting, severe dehydration, chest pain, breathing difficulty, severe persistent abdominal or pelvic pain, signs of  severe infection (increased redness, swelling of an area), feeling faint or passing out, dizziness, etc. You should especially go to the ED for sudden acute worsening of condition if you do not elect to go at this time.      ED Prescriptions     Medication Sig Dispense Auth. Provider   predniSONE (DELTASONE) 20 MG tablet Take 1 tablet (20 mg total) by mouth daily for 10 days. 10 tablet Hadrian Yarbrough, DO   cephALEXin (KEFLEX) 500 MG capsule Take 1 capsule (500 mg total) by mouth 4 (four) times daily for 5 days. 20 capsule Lyndee Hensen, DO      PDMP not reviewed this encounter.   Lyndee Hensen, DO 01/26/22 2238

## 2022-01-26 NOTE — ED Triage Notes (Signed)
Patient presents to UC for bilateral eye irration -- patient reports symptoms started yesterday.   Patient reports an odor and a sweet smell.

## 2022-01-26 NOTE — Discharge Instructions (Addendum)
Stop by the pharmacy to pick up your prescriptions.  Follow up with your primary care provider as needed.  Go to ED for red flag symptoms, including; fevers you cannot reduce with Tylenol/Motrin, severe headaches, vision changes, numbness/weakness in part of the body, lethargy, confusion, intractable vomiting, severe dehydration, chest pain, breathing difficulty, severe persistent abdominal or pelvic pain, signs of severe infection (increased redness, swelling of an area), feeling faint or passing out, dizziness, etc. You should especially go to the ED for sudden acute worsening of condition if you do not elect to go at this time.   

## 2022-01-28 ENCOUNTER — Other Ambulatory Visit: Payer: Self-pay

## 2022-01-28 ENCOUNTER — Emergency Department
Admission: EM | Admit: 2022-01-28 | Discharge: 2022-01-28 | Disposition: A | Discharge status: Home / Self Care | Payer: Medicare Other | Attending: Emergency Medicine | Admitting: Emergency Medicine

## 2022-01-28 DIAGNOSIS — E039 Hypothyroidism, unspecified: Secondary | ICD-10-CM | POA: Diagnosis not present

## 2022-01-28 DIAGNOSIS — I10 Essential (primary) hypertension: Secondary | ICD-10-CM | POA: Insufficient documentation

## 2022-01-28 DIAGNOSIS — H5789 Other specified disorders of eye and adnexa: Secondary | ICD-10-CM | POA: Diagnosis present

## 2022-01-28 DIAGNOSIS — J45909 Unspecified asthma, uncomplicated: Secondary | ICD-10-CM | POA: Diagnosis not present

## 2022-01-28 DIAGNOSIS — I4891 Unspecified atrial fibrillation: Secondary | ICD-10-CM | POA: Insufficient documentation

## 2022-01-28 DIAGNOSIS — L259 Unspecified contact dermatitis, unspecified cause: Secondary | ICD-10-CM | POA: Insufficient documentation

## 2022-01-28 MED ORDER — FLUORESCEIN SODIUM 1 MG OP STRP
1.0000 | ORAL_STRIP | Freq: Once | OPHTHALMIC | Status: AC
  Administered 2022-01-28: 1 via OPHTHALMIC
  Filled 2022-01-28: qty 1

## 2022-01-28 NOTE — ED Provider Notes (Signed)
[facility] Provider Note    Event Date/Time   First MD Initiated Contact with Patient 01/28/22 1127     (approximate)   History   Chief Complaint Eye Problem   HPI [name] is a 88 y.o. female, history of A-fib, DDD, hypothyroidism, hyperlipidemia, hypertension, asthma, TIA, pseudogout, hyperparathyroidism, presents to the emergency department for evaluation of eye problem.  Patient states that a few days ago, she was try to put on some facial cream under her eyes, however believes that she may have put something else under her eyes instead.  She states that it is crusty, sticking to her skin, and has a lemon smell to it.  She was seen by urgent care yesterday, where she was prescribed antibiotics and steroids.  She has been taking these medications, however she states that her symptoms persist.    Denies fever/chills, chest pain, shortness of breath, abdominal pain, blurred vision, nausea/vomiting, headache, vertigo, hearing changes, or dizziness/lightheadedness.  Denies any active discharge from her eyes.  Denies any recent sick contacts.  History Limitations: No limitations.        Physical Exam  Triage Vital Signs: ED Triage Vitals  Enc Vitals Group     BP 01/28/22 1107 137/66     Pulse Rate 01/28/22 1107 73     Resp 01/28/22 1107 18     Temp 01/28/22 1107 97.9 F (36.6 C)     Temp Source 01/28/22 1107 Oral     SpO2 01/28/22 1107 97 %     Weight 01/28/22 1109 121 lb (54.9 kg)     Height --      Head Circumference --      Peak Flow --      Pain Score 01/28/22 1108 0     Pain Loc --      Pain Edu? --      Excl. in [place]? --     Most recent vital signs: Vitals:   01/28/22 1107  BP: 137/66  Pulse: 73  Resp: 18  Temp: 97.9 F (36.6 C)  SpO2: 97%    General: Awake, NAD.  Skin: Warm, dry. No rashes or lesions.  CV: Good peripheral perfusion.  Resp: Normal effort.  Abd: Soft, non-tender. No distention.  Neuro: At baseline.  No gross neurological deficits.  Musculoskeletal: Normal ROM of all extremities.  Focused Exam: Erythematous rash around the periorbital spaces of her eyes with what appears to be some crusting.  No warmth or tenderness along the orbits.  No edema.  PERRL, EOMI without pain.  Fluorescein examination shows no evidence of foreign bodies, corneal abrasions, or corneal ulcers.  No foreign body seen on eyelid eversion.  Physical Exam    ED Results / Procedures / Treatments  Labs (all labs ordered are listed, but only abnormal results are displayed) Labs Reviewed - No data to display   EKG N/A.    RADIOLOGY  ED Provider Interpretation: N/A.  No results found.  PROCEDURES:  Critical Care performed: N/A.  Procedures    MEDICATIONS ORDERED IN ED: Medications  fluorescein ophthalmic strip 1 strip (1 strip Both Eyes Given by Other 01/28/22 1139)     IMPRESSION / MDM / ASSESSMENT AND PLAN / ED COURSE  I reviewed the triage vital signs and the nursing notes.                              Differential diagnosis includes,  but is not limited to, contact dermatitis, cellulitis, periorbital cellulitis, corneal abrasion, ulcer, conjunctivitis.   Assessment/Plan Presentation consistent with contact dermatitis, likely secondary to this unknown substance that was applied around her eyes a few days prior.  Spoke with both her and her husband, they state that they are unsure what cream/substance it was.  When asked if they had access to it, they said that they were "unsure".  I was able to wash some of the crusting and suspected material around her eyes with saline and water-soluble lubricant.  She states that she feels better after this.  Very low suspicion for orbital or periorbital cellulitis given her presentation.  She is afebrile, no pain with EOMI, no warmth, tenderness, or surrounding edema.  However, encouraged her to continue taking her antibiotics and prednisone as this may help her  symptoms and prevent a secondary infection.  She states that she has been applying some other creams and ointments to the area, however cannot definitively state what exactly.  After that she may be exacerbating her symptoms and may potentially be putting her at risk for reapplication of the foreign substance.  I recommended her to not apply any more substances to the face unless it is water for the time being.  We will provide her with a referral to dermatology for further evaluation if her rash persists.  Patient was amenable to this plan.  Will discharge.  Provided the patient with anticipatory guidance, return precautions, and educational material. Encouraged the patient to return to the emergency department at any time if they begin to experience any new or worsening symptoms. Patient expressed understanding and agreed with the plan.   Patient's presentation is most consistent with acute, uncomplicated illness.       FINAL CLINICAL IMPRESSION(S) / ED DIAGNOSES   Final diagnoses:  Contact dermatitis, unspecified contact dermatitis type, unspecified trigger     Rx / DC Orders   ED Discharge Orders     None        Note:  This document was prepared using Dragon voice recognition software and may include unintentional dictation errors.   Teodoro Spray, [place] 01/28/22 1205    Naaman Plummer, MD 01/28/22 [phone]

## 2022-01-28 NOTE — Discharge Instructions (Signed)
-  Did not apply any more ointments, lotions, or creams to your eyes.  Please allow it to heal on its own.  You may wash with water, but no irritating soaps.  -You may take Zyrtec or Benadryl as needed for the itchiness.  -Continue taking your cephalexin and prednisone as previously prescribed.  -Follow-up with the dermatology office listed in these instructions if your rash persists or fails to improve after 3 to 5 days.  -Return to the emergency department anytime if you begin to experience any new or worsening symptoms.

## 2022-01-28 NOTE — ED Triage Notes (Signed)
PT to clinic for eye irritation yesterday and arrives today with swelling and redness around eyes this morning.  Pt endorses itch.

## 2022-01-29 LAB — URINE CULTURE: Culture: >=100000 — AB

## 2022-02-08 ENCOUNTER — Ambulatory Visit
Admission: EM | Admit: 2022-02-08 | Discharge: 2022-02-08 | Disposition: A | Discharge status: Home / Self Care | Payer: Medicare Other | Attending: Internal Medicine | Admitting: Internal Medicine

## 2022-02-08 ENCOUNTER — Encounter: Payer: Self-pay | Admitting: Emergency Medicine

## 2022-02-08 DIAGNOSIS — N3001 Acute cystitis with hematuria: Secondary | ICD-10-CM | POA: Diagnosis present

## 2022-02-08 LAB — CBC WITH DIFFERENTIAL/PLATELET
Abs Immature Granulocytes: 0.06 10*3/uL (ref 0.00–0.07)
Basophils Absolute: 0.1 10*3/uL (ref 0.0–0.1)
Basophils Relative: 1 %
Eosinophils Absolute: 0.4 10*3/uL (ref 0.0–0.5)
Eosinophils Relative: 3 %
HCT: 40.3 % (ref 36.0–46.0)
Hemoglobin: 12.4 g/dL (ref 12.0–15.0)
Immature Granulocytes: 1 %
Lymphocytes Relative: 12 %
Lymphs Abs: 1.4 10*3/uL (ref 0.7–4.0)
MCH: 25.7 pg — ABNORMAL LOW (ref 26.0–34.0)
MCHC: 30.8 g/dL (ref 30.0–36.0)
MCV: 83.6 fL (ref 80.0–100.0)
Monocytes Absolute: 1.2 10*3/uL — ABNORMAL HIGH (ref 0.1–1.0)
Monocytes Relative: 10 %
Neutro Abs: 8.6 10*3/uL — ABNORMAL HIGH (ref 1.7–7.7)
Neutrophils Relative %: 73 %
Platelets: 238 10*3/uL (ref 150–400)
RBC: 4.82 MIL/uL (ref 3.87–5.11)
RDW: 17.3 % — ABNORMAL HIGH (ref 11.5–15.5)
WBC: 11.6 10*3/uL — ABNORMAL HIGH (ref 4.0–10.5)
nRBC: 0 % (ref 0.0–0.2)

## 2022-02-08 LAB — BASIC METABOLIC PANEL
Anion gap: 7 (ref 5–15)
BUN: 17 mg/dL (ref 8–23)
CO2: 26 mmol/L (ref 22–32)
Calcium: 8.2 mg/dL — ABNORMAL LOW (ref 8.9–10.3)
Chloride: 103 mmol/L (ref 98–111)
Creatinine, Ser: 0.58 mg/dL (ref 0.44–1.00)
GFR, Estimated: >60 mL/min (ref >60)
Glucose, Bld: 98 mg/dL (ref 70–99)
Potassium: 4.2 mmol/L (ref 3.5–5.1)
Sodium: 136 mmol/L (ref 135–145)

## 2022-02-08 LAB — URINALYSIS, MICROSCOPIC (REFLEX)

## 2022-02-08 LAB — URINALYSIS, ROUTINE W REFLEX MICROSCOPIC
Bilirubin Urine: NEGATIVE
Glucose, UA: NEGATIVE mg/dL
Nitrite: POSITIVE — AB
Protein, ur: >300 mg/dL — AB
Specific Gravity, Urine: 1.025 (ref 1.005–1.030)
pH: 6 (ref 5.0–8.0)

## 2022-02-08 MED ORDER — CEFDINIR 300 MG PO CAPS: 300.0000 mg | ORAL_CAPSULE | Freq: Two times a day (BID) | ORAL | 0 refills | Status: AC

## 2022-02-08 NOTE — ED Provider Notes (Signed)
[facility]    CSN: [id number] Arrival date & time: 02/08/22  1154      History   Chief Complaint Chief Complaint  Patient presents with   Hematuria   Dizziness    HPI Jewelene Mairena is a 88 y.o. female comes to urgent care with 1 day history of dizziness when she stands and blood in urine.  Patient was seen here 2 weeks ago and diagnosed with a UTI.  She was given a dose of ceftriaxone and prescribed Keflex.  Patient is not very clear that she took Keflex.  She initially felt slightly better but her urgency and urinary frequency worsened especially over the past few days.  She has noticed some blood in the urine.  She woke up this morning feeling lightheaded when she stands and has some blurry vision which is worsened over the past few hours.  She denies any numbness or tingling in the extremities.  No changes in extremity weakness.  No nausea, vomiting, flank pain or diarrhea.  She denies any fainting or near fainting episodes.  No rash noted.  No vaginal discharge.   HPI  Past Medical History:  Diagnosis Date   Arthritis    Asthma    Atrial fibrillation ([place])    Cataract    Colon cancer metastasized to multiple sites ([place]) 05/04/2021   Eczema    GERD (gastroesophageal reflux disease)    Gout of wrist    Heart murmur    Pacemaker    Pseudogout of ankle or foot    Thyroid disease    TIA (transient ischemic attack)     Patient Active Problem List   Diagnosis Date Noted   Abdominal pain 06/15/2021   Hyperparathyroidism ([place]) 06/15/2021   Hypokalemia 06/15/2021   Colon cancer metastasized to multiple sites ([place]) 05/04/2021   Acute GI bleeding 04/23/2021   HTN (hypertension) 04/23/2021   Asthma 04/23/2021   TIA (transient ischemic attack) 04/23/2021   Atrial fibrillation, chronic ([place]) 04/23/2021   Acute blood loss anemia 04/23/2021   Iron deficiency anemia 04/23/2021   Hyponatremia    Symptomatic anemia    Closed left hip fracture ([place]) 09/15/2020    Acquired thrombophilia ([place]) 12/18/2019   Acute appendicitis 07/25/2019   Chronic right-sided low back pain 07/06/2019   Tinnitus of both ears 07/06/2019   Leukocytosis 06/29/2016   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   Temporary cerebral vascular dysfunction 02/28/2014   DD (diverticular disease) 02/28/2014   Allergic rhinitis 02/28/2014   Allergy status to sulfonamides 02/28/2014   History of anticoagulant therapy 02/28/2014   Arthritis 02/28/2014   Airway hyperreactivity 02/28/2014   A-fib ([place]) 02/28/2014   Bilateral cataracts 02/28/2014   Dermatitis, eczematoid 02/28/2014   Allergy to environmental factors 02/28/2014   Acid reflux 02/28/2014   Polypharmacy 02/28/2014   HLD (hyperlipidemia) 02/28/2014   Adult hypothyroidism 02/28/2014   BP (high blood pressure) 02/28/2014   Arthritis, degenerative 02/28/2014   Pseudogout 12/11/2013   Degenerative arthritis of hip 10/09/2013   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   Neck rigid 05/30/2013   1st degree AV block 10/10/2012   History of biliary T-tube placement 06/14/2012   History of cardioversion 06/05/2012   Avascular necrosis of femoral head ([place]) 06/01/2012   Lumbar radiculopathy 06/01/2012   DDD (degenerative disc disease), lumbar 06/01/2012   H/O cardiac catheterization 10/20/2009    Past Surgical History:  Procedure Laterality Date   ABLATION     COLONOSCOPY N/A 04/26/2021   Procedure: COLONOSCOPY;  Surgeon: Toledo, Benay Pike, MD;  Location: [facility];  Service: Gastroenterology;  Laterality: N/A;   COLONOSCOPY N/A 04/27/2021   Procedure: COLONOSCOPY;  Surgeon: Toledo, Benay Pike, MD;  Location: [facility];  Service: Gastroenterology;  Laterality: N/A;   ESOPHAGOGASTRODUODENOSCOPY N/A 04/26/2021   Procedure: ESOPHAGOGASTRODUODENOSCOPY (EGD);  Surgeon: Toledo, Benay Pike, MD;  Location: [facility];  Service: Gastroenterology;  Laterality: N/A;   EYE SURGERY Bilateral    cataract    foot sugery     HIP ARTHROPLASTY     right   IMPLANTABLE CARDIOVERTER DEFIBRILLATOR (ICD) GENERATOR CHANGE Left 05/29/2019   Procedure: PACEMAKER CHANGE OUT;  Surgeon: Isaias Cowman, MD;  Location: [facility];  Service: Cardiovascular;  Laterality: Left;   INTRAMEDULLARY (IM) NAIL INTERTROCHANTERIC Left 09/16/2020   Procedure: INTRAMEDULLARY (IM) NAIL INTERTROCHANTRIC;  Surgeon: Renee Harder, MD;  Location: [facility];  Service: Orthopedics;  Laterality: Left;   PACEMAKER INSERTION      OB History   No obstetric history on file.      Home Medications    Prior to Admission medications   Medication Sig Start Date End Date Taking? Authorizing Provider  acetaminophen (TYLENOL) 650 MG CR tablet Take 650 mg by mouth every 8 (eight) hours as needed for pain.    [provider]  atorvastatin (LIPITOR) 40 MG tablet Take 40 mg by mouth every evening.     [provider]  azelastine (OPTIVAR) 0.05 % ophthalmic solution Place 1 drop into both eyes 2 (two) times daily. For 7 days 08/07/21   Rodriguez-Southworth, Sunday Spillers, PA-C  budesonide-formoterol (SYMBICORT) 80-4.5 MCG/ACT inhaler Inhale 2 puffs into the lungs 2 (two) times daily.  09/09/13   [provider]  Cholecalciferol (VITAMIN D3) 50 MCG (2000 UT) TABS Take 2,000 Units by mouth daily.     [provider]  diclofenac Sodium (VOLTAREN) 1 % GEL Apply 2 g topically daily as needed (pain).  08/22/13   [provider]  docusate sodium (COLACE) 100 MG capsule Take 1 capsule (100 mg total) by mouth 2 (two) times daily. 09/19/20   Bonnielee Haff, MD  famotidine (PEPCID) 20 MG tablet Take 20 mg by mouth 2 (two) times daily.     [provider]  ferrous sulfate 325 (65 FE) MG tablet Take 1 tablet (325 mg total) by mouth 2 (two) times daily with a meal. 04/27/21   Fritzi Mandes, MD  levothyroxine (SYNTHROID, LEVOTHROID) 112 MCG tablet Take 112 mcg by mouth daily before breakfast.    [provider]  montelukast (SINGULAIR) 10 MG tablet Take 10 mg by mouth at bedtime.  12/27/13   [provider]  Olopatadine HCl 0.7 % SOLN Apply 1 drop to eye at bedtime.     [provider]  Omega-3 1000 MG CAPS Take 1,000 mg by mouth daily.     [provider]  Skin Protectants, Misc. (EUCERIN) cream Apply topically as needed for dry skin. Apply to tops of feet 07/07/20   Cuthriell, Charline Bills, PA-C    Family History Family History  Problem Relation Age of Onset   Leukemia Father    Lung cancer Sister    Breast cancer Neg Hx     Social History Social History   Tobacco Use   Smoking status: Never   Smokeless tobacco: Never  Vaping Use   Vaping Use: Never used  Substance Use Topics   Alcohol use: Not Currently    Alcohol/week: 0.0 standard drinks of alcohol   Drug use: No  Allergies   Fluticasone-salmeterol, Albuterol, Ciprofloxacin, Codeine, Colchicine, Other, Promethazine, Valacyclovir, Guaifenesin, and Sulfa antibiotics   Review of Systems Review of Systems  Constitutional: Negative.   HENT: Negative.    Respiratory: Negative.    Gastrointestinal: Negative.   Genitourinary:  Positive for frequency and urgency. Negative for dysuria and vaginal discharge.  Neurological:  Positive for dizziness. Negative for light-headedness.     Physical Exam Triage Vital Signs ED Triage Vitals [02/08/22 1216]  Enc Vitals Group     BP 137/67     Pulse Rate 72     Resp (!) 126     Temp 98.4 F (36.9 C)     Temp Source Oral     SpO2 98 %     Weight      Height      Head Circumference      Peak Flow      Pain Score      Pain Loc      Pain Edu?      Excl. in [place]?    Orthostatic VS for the past 24 hrs:  BP- Lying Pulse- Lying BP- Sitting Pulse- Sitting BP- Standing at 0 minutes Pulse- Standing at 0 minutes  02/08/22 1311 136/69 69 133/61 61 126/66 63    Updated Vital Signs BP 137/67 (BP Location: Left Arm)   Pulse 72   Temp 98.4 F (36.9  C) (Oral)   Resp (!) 126   SpO2 98%   Visual Acuity Right Eye Distance:   Left Eye Distance:   Bilateral Distance:    Right Eye Near:   Left Eye Near:    Bilateral Near:     Physical Exam Vitals and nursing note reviewed.  Constitutional:      Appearance: She is not ill-appearing.  HENT:     Right Ear: Tympanic membrane normal.     Left Ear: Tympanic membrane normal.  Cardiovascular:     Rate and Rhythm: Normal rate and regular rhythm.  Pulmonary:     Effort: Pulmonary effort is normal.     Breath sounds: Normal breath sounds.  Abdominal:     General: Bowel sounds are normal.     Palpations: Abdomen is soft.  Musculoskeletal:        General: Normal range of motion.  Neurological:     Mental Status: She is alert.      UC Treatments / Results  Labs (all labs ordered are listed, but only abnormal results are displayed) Labs Reviewed  URINALYSIS, ROUTINE W REFLEX MICROSCOPIC - Abnormal; Notable for the following components:      Result Value   Color, Urine AMBER (*)    APPearance CLOUDY (*)    Hgb urine dipstick LARGE (*)    Ketones, ur TRACE (*)    Protein, ur >300 (*)    Nitrite POSITIVE (*)    Leukocytes,Ua SMALL (*)    All other components within normal limits  CBC WITH DIFFERENTIAL/PLATELET - Abnormal; Notable for the following components:   WBC 11.6 (*)    MCH 25.7 (*)    RDW 17.3 (*)    Neutro Abs 8.6 (*)    Monocytes Absolute 1.2 (*)    All other components within normal limits  BASIC METABOLIC PANEL - Abnormal; Notable for the following components:   Calcium 8.2 (*)    All other components within normal limits  URINALYSIS, MICROSCOPIC (REFLEX) - Abnormal; Notable for the following components:   Bacteria, UA MANY (*)    All  other components within normal limits    EKG   Radiology No results found.  Procedures Procedures (including critical care time)  Medications Ordered in UC Medications - No data to display  Initial Impression /  Assessment and Plan / UC Course  I have reviewed the triage vital signs and the nursing notes.  Pertinent labs & imaging results that were available during my care of the patient were reviewed by me and considered in my medical decision making (see chart for details).     1.  Acute cystitis with hematuria, noncompliant with antibiotics: Urinalysis is significant for hemoglobin, nitrite, leukocyte Estrace and ketones. Omnicef 300 mg twice daily for 5 days Patient is advised to increase oral fluid intake Urine cultures have been sent We will call patient with recommendations if labs are abnormal Orthostatic blood pressures negative for orthostasis Return precautions given Final Clinical Impressions(s) / UC Diagnoses   Final diagnoses:  Acute cystitis with hematuria     Discharge Instructions      Please increase oral fluid intake Your medications have been sent to the pharmacy.  Please take it as prescribed. We will call you with recommendations if labs are abnormal If you have persistent symptoms please return to urgent care to be reevaluated.     ED Prescriptions   None    PDMP not reviewed this encounter.   Chase Picket, MD 02/08/22 1327

## 2022-02-08 NOTE — Discharge Instructions (Addendum)
Please increase oral fluid intake Your medications have been sent to the pharmacy.  Please take it as prescribed. We will call you with recommendations if labs are abnormal If you have persistent symptoms please return to urgent care to be reevaluated.

## 2022-02-08 NOTE — ED Triage Notes (Signed)
Pt presents with hematuria that started last night and dizziness that started this morning. Pt states that her head is full and she has blurred vision. Pt denies any chest pain.

## 2022-02-09 LAB — URINE CULTURE: Culture: >=100000 — AB

## 2022-03-05 ENCOUNTER — Emergency Department
Admission: EM | Admit: 2022-03-05 | Discharge: 2022-03-05 | Disposition: A | Discharge status: Home / Self Care | Payer: Medicare Other | Attending: Emergency Medicine | Admitting: Emergency Medicine

## 2022-03-05 ENCOUNTER — Other Ambulatory Visit: Payer: Self-pay

## 2022-03-05 ENCOUNTER — Emergency Department: Payer: Medicare Other

## 2022-03-05 DIAGNOSIS — R002 Palpitations: Secondary | ICD-10-CM | POA: Insufficient documentation

## 2022-03-05 DIAGNOSIS — Z950 Presence of cardiac pacemaker: Secondary | ICD-10-CM | POA: Diagnosis not present

## 2022-03-05 DIAGNOSIS — R42 Dizziness and giddiness: Secondary | ICD-10-CM | POA: Diagnosis not present

## 2022-03-05 LAB — CBC
HCT: 39.5 % (ref 36.0–46.0)
Hemoglobin: 12.1 g/dL (ref 12.0–15.0)
MCH: 25.1 pg — ABNORMAL LOW (ref 26.0–34.0)
MCHC: 30.6 g/dL (ref 30.0–36.0)
MCV: 81.8 fL (ref 80.0–100.0)
Platelets: 313 10*3/uL (ref 150–400)
RBC: 4.83 MIL/uL (ref 3.87–5.11)
RDW: 14.7 % (ref 11.5–15.5)
WBC: 10.7 10*3/uL — ABNORMAL HIGH (ref 4.0–10.5)
nRBC: 0 % (ref 0.0–0.2)

## 2022-03-05 LAB — COMPREHENSIVE METABOLIC PANEL
ALT: 12 U/L (ref 0–44)
AST: 19 U/L (ref 15–41)
Albumin: 3.5 g/dL (ref 3.5–5.0)
Alkaline Phosphatase: 115 U/L (ref 38–126)
Anion gap: 6 (ref 5–15)
BUN: 24 mg/dL — ABNORMAL HIGH (ref 8–23)
CO2: 26 mmol/L (ref 22–32)
Calcium: 8.8 mg/dL — ABNORMAL LOW (ref 8.9–10.3)
Chloride: 105 mmol/L (ref 98–111)
Creatinine, Ser: 0.6 mg/dL (ref 0.44–1.00)
GFR, Estimated: >60 mL/min (ref >60)
Glucose, Bld: 98 mg/dL (ref 70–99)
Potassium: 4.1 mmol/L (ref 3.5–5.1)
Sodium: 137 mmol/L (ref 135–145)
Total Bilirubin: 0.7 mg/dL (ref 0.3–1.2)
Total Protein: 7.2 g/dL (ref 6.5–8.1)

## 2022-03-05 NOTE — ED Provider Notes (Signed)
[facility] Provider Note   Event Date/Time   First MD Initiated Contact with Patient 03/05/22 1812     (approximate) History  Irregular Heart Beat  HPI [name] is a 88 y.o. female with a stated past medical history of atrial fibrillation status post pacemaker placement who presents for palpitations that lasted approximately 1 hour.  Patient states that these occurred at approximately 230 today and resolved spontaneously over the next hour after she arrived to the emergency department.  Patient states that she also had lightheadedness and felt as though she was going to walk into a wall.  Patient states that she began feeling these palpitations and immediately took her pulse on her wrist where she found it to be "irregular and too fast".  Patient states that she has had similar symptoms in the past when she has had atrial fibrillation ROS: Patient currently denies any vision changes, tinnitus, difficulty speaking, facial droop, sore throat, chest pain, shortness of breath, abdominal pain, nausea/vomiting/diarrhea, dysuria, or weakness/numbness/paresthesias in any extremity   Physical Exam  Triage Vital Signs: ED Triage Vitals  Enc Vitals Group     BP 03/05/22 1543 (!) 121/59     Pulse Rate 03/05/22 1543 62     Resp 03/05/22 1543 16     Temp 03/05/22 1543 98.7 F (37.1 C)     Temp Source 03/05/22 1543 Oral     SpO2 03/05/22 1543 97 %     Weight 03/05/22 1546 121 lb (54.9 kg)     Height 03/05/22 1546 '5\' 4"'$  (1.626 m)     Head Circumference --      Peak Flow --      Pain Score 03/05/22 1546 0     Pain Loc --      Pain Edu? --      Excl. in [place]? --    Most recent vital signs: Vitals:   03/05/22 1543  BP: (!) 121/59  Pulse: 62  Resp: 16  Temp: 98.7 F (37.1 C)  SpO2: 97%   General: Awake, oriented x4. CV:  Good peripheral perfusion.  Resp:  Normal effort.  Abd:  No distention.  Other:  Elderly Caucasian female laying in bed in no acute  distress ED Results / Procedures / Treatments  Labs (all labs ordered are listed, but only abnormal results are displayed) Labs Reviewed  CBC - Abnormal; Notable for the following components:      Result Value   WBC 10.7 (*)    MCH 25.1 (*)    All other components within normal limits  COMPREHENSIVE METABOLIC PANEL - Abnormal; Notable for the following components:   BUN 24 (*)    Calcium 8.8 (*)    All other components within normal limits   EKG ED ECG REPORT I, Naaman Plummer, the attending physician, personally viewed and interpreted this ECG. Date: 03/05/2022 EKG Time: 1540 Rate: 70 Rhythm: Ventricularly paced rhythm QRS Axis: normal Intervals: Left bundle branch block secondary to ventricularly paced rhythm ST/T Wave abnormalities: normal Narrative Interpretation: Ventricularly paced rhythm with appropriate left bundle branch block.  No evidence of acute ischemia RADIOLOGY ED MD interpretation: 2 view chest x-ray interpreted by me shows no evidence of acute abnormalities including no pneumonia, pneumothorax, or widened mediastinum -Agree with radiology assessment Official radiology report(s): DG Chest 2 View  Result Date: 03/05/2022 CLINICAL DATA:  Flutter sensation in the chest.  Pacemaker. EXAM: CHEST - 2 VIEW COMPARISON:  04/23/2021. FINDINGS: Pacer noted with  right ventricular lead in coronary sinus sensing lead. Atherosclerotic calcification of the aortic arch. Heart size this currently within normal limits in the previous pulmonary edema shown on 04/23/2021 is no longer present. No blunting of the costophrenic angles. Thoracic spondylosis noted. Mild biapical pleuroparenchymal scarring. Suspected mitral annular calcification. Bony demineralization. IMPRESSION: 1. Normal cardiac size.  Pacer device noted. 2. Thoracic spondylosis. 3. Bony demineralization. 4.  Aortic Atherosclerosis (ICD10-I70.0). Electronically Signed   By: Van Clines M.D.   On: 03/05/2022 16:31    PROCEDURES: Critical Care performed: No .1-3 Lead EKG Interpretation  Performed by: Naaman Plummer, MD Authorized by: Naaman Plummer, MD     Interpretation: abnormal     ECG rate:  70   ECG rate assessment: normal     Rhythm: paced     Ectopy: none     Conduction: normal    MEDICATIONS ORDERED IN ED: Medications - No data to display IMPRESSION / MDM / [place] / ED COURSE  I reviewed the triage vital signs and the nursing notes.                             The patient is on the cardiac monitor to evaluate for evidence of arrhythmia and/or significant heart rate changes. Patient's presentation is most consistent with acute presentation with potential threat to life or bodily function. 88 year old female presents with palpitations. EKG: No STEMI and no evidence of Brugadas sign, delta wave, epsilon wave, significantly prolonged QTc, or malignant arrhythmia. Based on H&P and testing, this patient appears to be low risk for emergent causes of palpitations such as, but not limited to, a malignant cardiac arrhythmia, ACS, pulmonary embolism, thyrotoxicosis, PNA, PTX.  Interrogation of patient's pacemaker does not show any evidence of arrhythmias  The patient has been reexamined and is ready to be discharged.  All diagnostic results have been reviewed and discussed with the patient/family.  Care plan has been outlined and the patient/family understands all current diagnoses, results, and treatment plans.  There are no new complaints, changes, or physical findings at this time.  All questions have been addressed and answered.  Patient was instructed to, and agrees to follow-up with their primary care physician as well as return to the emergency department if any new or worsening symptoms develop.   FINAL CLINICAL IMPRESSION(S) / ED DIAGNOSES   Final diagnoses:  Palpitations   Rx / DC Orders   ED Discharge Orders          Ordered    Ambulatory referral to Cardiology        Comments: If you have not heard from the Cardiology office within the next 72 hours please call [phone].   03/05/22 2017           Note:  This document was prepared using Dragon voice recognition software and may include unintentional dictation errors.   Naaman Plummer, MD 03/05/22 2025

## 2022-03-05 NOTE — ED Triage Notes (Signed)
Pt states she feels like something is going on with her heart- pt denies chest pain , but states she feels fluttery in her chest- pt does have a pacemaker- pt states she felt her radial pulse and it felt faster than normal- pt has also had a lot of dizziness off and on the last few days

## 2022-03-20 ENCOUNTER — Emergency Department
Admission: EM | Admit: 2022-03-20 | Discharge: 2022-03-20 | Disposition: A | Discharge status: Home / Self Care | Payer: Medicare Other | Attending: Emergency Medicine | Admitting: Emergency Medicine

## 2022-03-20 ENCOUNTER — Emergency Department: Payer: Medicare Other

## 2022-03-20 ENCOUNTER — Other Ambulatory Visit: Payer: Self-pay

## 2022-03-20 DIAGNOSIS — R1084 Generalized abdominal pain: Secondary | ICD-10-CM

## 2022-03-20 DIAGNOSIS — K5900 Constipation, unspecified: Secondary | ICD-10-CM | POA: Insufficient documentation

## 2022-03-20 DIAGNOSIS — Z950 Presence of cardiac pacemaker: Secondary | ICD-10-CM | POA: Insufficient documentation

## 2022-03-20 DIAGNOSIS — R109 Unspecified abdominal pain: Secondary | ICD-10-CM | POA: Diagnosis present

## 2022-03-20 DIAGNOSIS — Z85038 Personal history of other malignant neoplasm of large intestine: Secondary | ICD-10-CM | POA: Diagnosis not present

## 2022-03-20 LAB — CBC WITH DIFFERENTIAL/PLATELET
Abs Immature Granulocytes: 0.05 10*3/uL (ref 0.00–0.07)
Basophils Absolute: 0.1 10*3/uL (ref 0.0–0.1)
Basophils Relative: 1 %
Eosinophils Absolute: 0.3 10*3/uL (ref 0.0–0.5)
Eosinophils Relative: 3 %
HCT: 37.6 % (ref 36.0–46.0)
Hemoglobin: 11.7 g/dL — ABNORMAL LOW (ref 12.0–15.0)
Immature Granulocytes: 1 %
Lymphocytes Relative: 15 %
Lymphs Abs: 1.6 10*3/uL (ref 0.7–4.0)
MCH: 24.8 pg — ABNORMAL LOW (ref 26.0–34.0)
MCHC: 31.1 g/dL (ref 30.0–36.0)
MCV: 79.8 fL — ABNORMAL LOW (ref 80.0–100.0)
Monocytes Absolute: 1.3 10*3/uL — ABNORMAL HIGH (ref 0.1–1.0)
Monocytes Relative: 12 %
Neutro Abs: 7.2 10*3/uL (ref 1.7–7.7)
Neutrophils Relative %: 68 %
Platelets: 413 10*3/uL — ABNORMAL HIGH (ref 150–400)
RBC: 4.71 MIL/uL (ref 3.87–5.11)
RDW: 13.8 % (ref 11.5–15.5)
WBC: 10.5 10*3/uL (ref 4.0–10.5)
nRBC: 0 % (ref 0.0–0.2)

## 2022-03-20 LAB — COMPREHENSIVE METABOLIC PANEL
ALT: 10 U/L (ref 0–44)
AST: 20 U/L (ref 15–41)
Albumin: 3.3 g/dL — ABNORMAL LOW (ref 3.5–5.0)
Alkaline Phosphatase: 114 U/L (ref 38–126)
Anion gap: 10 (ref 5–15)
BUN: 17 mg/dL (ref 8–23)
CO2: 26 mmol/L (ref 22–32)
Calcium: 8.7 mg/dL — ABNORMAL LOW (ref 8.9–10.3)
Chloride: 101 mmol/L (ref 98–111)
Creatinine, Ser: 0.6 mg/dL (ref 0.44–1.00)
GFR, Estimated: >60 mL/min (ref >60)
Glucose, Bld: 91 mg/dL (ref 70–99)
Potassium: 4.1 mmol/L (ref 3.5–5.1)
Sodium: 137 mmol/L (ref 135–145)
Total Bilirubin: 0.8 mg/dL (ref 0.3–1.2)
Total Protein: 7.3 g/dL (ref 6.5–8.1)

## 2022-03-20 LAB — LIPASE, BLOOD: Lipase: 28 U/L (ref 11–51)

## 2022-03-20 MED ORDER — ONDANSETRON 4 MG PO TBDP: 4.0000 mg | ORAL_TABLET | Freq: Three times a day (TID) | ORAL | 0 refills | Status: DC | PRN

## 2022-03-20 MED ORDER — IOHEXOL 300 MG/ML  SOLN: 100.0000 mL | Freq: Once | INTRAMUSCULAR | Status: DC | PRN

## 2022-03-20 MED ORDER — ACETAMINOPHEN 500 MG PO TABS
1000.0000 mg | ORAL_TABLET | Freq: Once | ORAL | Status: DC
  Filled 2022-03-20: qty 2

## 2022-03-20 MED ORDER — IOHEXOL 350 MG/ML SOLN
75.0000 mL | Freq: Once | INTRAVENOUS | Status: AC | PRN
  Administered 2022-03-20: 75 mL via INTRAVENOUS

## 2022-03-20 NOTE — ED Provider Triage Note (Signed)
  Emergency Medicine Provider Triage Evaluation Note  [name] , a 88 y.o.female,  was evaluated in triage.  Pt complains of abdominal pain.  Patient states that she had a large meal 2 days ago.  For the first day, she was unable to have a bowel movement.  However, the next day she had a very large bowel movement, however is now left with persistent lower abdominal pain.  She does state that she has stage IV cancer in her colon and believes this may be related.   Review of Systems  Positive: Abdominal pain, constipation Negative: Denies fever, chest pain, vomiting  Physical Exam  There were no vitals filed for this visit. Gen:   Awake, no distress   Resp:  Normal effort  MSK:   Moves extremities without difficulty  Other:    Medical Decision Making  Given the patient's initial medical screening exam, the following diagnostic evaluation has been ordered. The patient will be placed in the appropriate treatment space, once one is available, to complete the evaluation and treatment. I have discussed the plan of care with the patient and I have advised the patient that an ED physician or mid-level practitioner will reevaluate their condition after the test results have been received, as the results may give them additional insight into the type of treatment they may need.    Diagnostics: Labs, UA, abdominal CT  Treatments: none immediately   Teodoro Spray, [place] 03/20/22 1258

## 2022-03-20 NOTE — ED Triage Notes (Signed)
Pt states she has not been able to have a normal BM in the last couple days- pt states she took 3 laxatives but was still unable to go- pt does have cancer that is in her colon and lower intestines

## 2022-03-20 NOTE — ED Notes (Signed)
Pt resting in bed.

## 2022-03-20 NOTE — ED Notes (Signed)
Pt to ED wanting to know if her rectal cancer (which is also in R ovary and large intestine) has spread and also wants to know if her explosive diarrhea last night was due to taking 3 laxatives yesterday. States yesterday was bloated and constipated. After having large BM last night, woke up today and had some abdominal discomfort. Pt ambulatory with rolling walker to treatment room. Daughter at bedside.

## 2022-03-20 NOTE — ED Provider Notes (Signed)
[facility] Provider Note    Event Date/Time   First MD Initiated Contact with Patient 03/20/22 1545     (approximate)   History   Constipation   HPI  [name] is a 88 y.o. female with past medical history significant for atrial fibrillation, pacemaker placement, metastatic colon cancer, who presents to the emergency department with constipation and abdominal pain.  Patient has a known history of colon cancer with diffuse metastasis.  After evaluation with general surgery ultimately was determined that the patient was not a good surgical candidate or a good candidate for chemotherapy or radiation.  Patient states over the past couple of days has been having issues with constipation.  Took a Dulcolax and MiraLAX and had a large bowel movement yesterday.  Ongoing pain today.  She was concerned that if she started eating again she may have the same situation and might have a bowel obstruction.  Endorses nausea but no episodes of vomiting.  Denies any new medications.  Does not currently follow with a primary care provider.     Physical Exam   Triage Vital Signs: ED Triage Vitals  Enc Vitals Group     BP 03/20/22 1312 132/66     Pulse Rate 03/20/22 1312 75     Resp 03/20/22 1312 18     Temp 03/20/22 1312 97.9 F (36.6 C)     Temp Source 03/20/22 1312 Oral     SpO2 03/20/22 1312 93 %     Weight 03/20/22 1314 120 lb (54.4 kg)     Height 03/20/22 1314 '5\' 4"'$  (1.626 m)     Head Circumference --      Peak Flow --      Pain Score 03/20/22 1313 7     Pain Loc --      Pain Edu? --      Excl. in [place]? --     Most recent vital signs: Vitals:   03/20/22 1800 03/20/22 1830  BP:    Pulse: 87 60  Resp: 16 14  Temp:    SpO2: 100% 96%    Physical Exam Constitutional:      Appearance: She is well-developed.  HENT:     Head: Atraumatic.  Eyes:     Conjunctiva/sclera: Conjunctivae normal.  Cardiovascular:     Rate and Rhythm: Regular rhythm.   Pulmonary:     Effort: No respiratory distress.  Abdominal:     General: There is distension.     Tenderness: There is abdominal tenderness.  Musculoskeletal:        General: Normal range of motion.     Cervical back: Normal range of motion.  Skin:    General: Skin is warm.  Neurological:     Mental Status: She is alert. Mental status is at baseline.          IMPRESSION / MDM / ASSESSMENT AND PLAN / ED COURSE  I reviewed the triage vital signs and the nursing notes.  Differential diagnosis including constipation, bowel obstruction, progression of her known metastatic disease  Patient given Tylenol for pain control.  After long conversation with the patient we will consult hospice/palliative care to discuss palliative care options with the patient.  Discussed with palliative care team, they will put in a consult and reach out with her early next week  RADIOLOGY I independently reviewed imaging, my interpretation of imaging: CT abdomen and pelvis with contrast no complete bowel obstruction noted, fluid-filled bowel loops.  Mass  noted  Read as progression of disease, partial small bowel obstruction    ED Results / Procedures / Treatments   Labs (all labs ordered are listed, but only abnormal results are displayed) Labs interpreted as -  Creatinine at baseline.  No significant electrolyte abnormalities.  Labs Reviewed  COMPREHENSIVE METABOLIC PANEL - Abnormal; Notable for the following components:      Result Value   Calcium 8.7 (*)    Albumin 3.3 (*)    All other components within normal limits  CBC WITH DIFFERENTIAL/PLATELET - Abnormal; Notable for the following components:   Hemoglobin 11.7 (*)    MCV 79.8 (*)    MCH 24.8 (*)    Platelets 413 (*)    Monocytes Absolute 1.3 (*)    All other components within normal limits  LIPASE, BLOOD  URINALYSIS, ROUTINE W REFLEX MICROSCOPIC    Reevaluation patient states that she is feeling better following the Tylenol.   Wanting to know if she can eat.  Long discussion about partial small bowel obstruction and that she has already been deemed not a surgical candidate.  Pain is well-controlled with no nausea or vomiting at this time.  Discussed slowly advancing her diet with clear liquids.  Given a prescription for nausea medication.  Discussed Tylenol for pain control.  Consulted and discussed with palliative care who will follow-up with the patient early this next week.  Given return precautions for any worsening symptoms.   PROCEDURES:  Critical Care performed: No  Procedures  Patient's presentation is most consistent with acute presentation with potential threat to life or bodily function.   MEDICATIONS ORDERED IN ED: Medications  acetaminophen (TYLENOL) tablet 1,000 mg (1,000 mg Oral Not Given 03/20/22 1654)  iohexol (OMNIPAQUE) 350 MG/ML injection 75 mL (75 mLs Intravenous Contrast Given 03/20/22 1733)    FINAL CLINICAL IMPRESSION(S) / ED DIAGNOSES   Final diagnoses:  Constipation, unspecified constipation type  Generalized abdominal pain     Rx / DC Orders   ED Discharge Orders          Ordered    ondansetron (ZOFRAN-ODT) 4 MG disintegrating tablet  Every 8 hours PRN        03/20/22 1850             Note:  This document was prepared using Dragon voice recognition software and may include unintentional dictation errors.   Nathaniel Man, MD 03/20/22 1851

## 2022-03-20 NOTE — Discharge Instructions (Signed)
You were seen in the emergency department for abdominal pain and constipation.  You had a CT scan done that showed progression of your cancer and spread of your cancer.  You are found to have a partial bowel obstruction.  Slowly advance your diet as tolerated.  Start with clear liquids and add food as tolerated.  You are given a prescription for nausea medication.  You can take Tylenol for pain control.  Palliative care will be calling you to schedule an appointment for early next week.  Follow-up with the primary care physician.  Return to the emergency department for worsening abdominal pain or vomiting.  You can take acetaminophen (Tylenol) 1000 mg every 6 hours as needed for abdominal pain.

## 2022-03-20 NOTE — Progress Notes (Addendum)
[facility]) Hospital Liaison Note  Notified by Dr. Jori Moll of patient request for [place] services at home after discharge.  [facility] outpatient palliative will reach out to patient early this week.  Please call with any questions or concerns.  Thank you, Margaretmary Eddy, BSN, RN [facility] Liaison  [phone]

## 2022-03-23 ENCOUNTER — Telehealth: Payer: Self-pay

## 2022-03-23 NOTE — Telephone Encounter (Signed)
12 pm.  Incoming call from patient requesting to schedule a home visit.  Visit scheduled for next Thursday at 11 am.

## 2022-03-24 ENCOUNTER — Telehealth: Payer: Self-pay

## 2022-03-24 NOTE — Telephone Encounter (Signed)
9 am.  Phone call to patient to offer a home visit tomorrow.  Patient advised her nephew is currently in the hospital and expected to pass anytime.  She would like to keep her scheduled appointment for next Thursday.  Patient advised her niece is currently with her assisting with needs.

## 2022-03-31 ENCOUNTER — Other Ambulatory Visit: Payer: Medicare Other

## 2022-03-31 DIAGNOSIS — Z515 Encounter for palliative care: Secondary | ICD-10-CM

## 2022-03-31 NOTE — Progress Notes (Signed)
PATIENT NAME: [name] DOB: Dec 23, 1924 MRN: [id number]  PRIMARY CARE PROVIDER: [facility]  RESPONSIBLE PARTY:  Acct ID - Guarantor Home Phone Work Phone Relationship Acct Type  [id number] KAMONI, [phone]  Self P/F     [address], [place], [place] [address]    Arrived at patient's home to complete initial Palliative Care visit.  Niece Bethena Roys is present and advised patient was admitted to [facility] last week.  Patient discharged from [facility].   Lorenza Burton, RN

## 2022-03-31 NOTE — Progress Notes (Signed)
COMMUNITY PALLIATIVE CARE SW NOTE  PATIENT NAME: [name] DOB: [birth year] MRN: [id number]  PRIMARY CARE PROVIDER: [facility]  RESPONSIBLE PARTY:  Acct ID - Guarantor Home Phone Work Phone Relationship Acct Type  [id number] URIAH, [phone]  Self P/F     [address], [place], [place] [address]    See Palliative care RN note for visit reference.      SOCIAL HX:  Social History   Tobacco Use   Smoking status: Never   Smokeless tobacco: Never  Substance Use Topics   Alcohol use: Not Currently    Alcohol/week: 0.0 standard drinks of alcohol        [place] Henrene Pastor, [place]

## 2022-04-08 ENCOUNTER — Ambulatory Visit (HOSPITAL_COMMUNITY): Payer: Medicare Other | Admitting: Nurse Practitioner

## 2022-04-12 ENCOUNTER — Ambulatory Visit (HOSPITAL_COMMUNITY): Payer: Medicare Other | Admitting: Nurse Practitioner

## 2022-05-04 ENCOUNTER — Ambulatory Visit: Payer: Medicare Other | Admitting: Cardiovascular Disease

## 2022-05-26 DEATH — deceased

## 2022-06-14 ENCOUNTER — Ambulatory Visit: Payer: Medicare Other | Admitting: Cardiovascular Disease
